# Patient Record
Sex: Female | Born: 1990 | Race: White | Hispanic: No | Marital: Married | Smoking: Former smoker
Health system: Southern US, Community
[De-identification: ages and names within clinical notes are randomized; demographics above are authoritative.]

## PROBLEM LIST (undated history)

## (undated) DIAGNOSIS — F419 Anxiety disorder, unspecified: Secondary | ICD-10-CM

## (undated) DIAGNOSIS — O24419 Gestational diabetes mellitus in pregnancy, unspecified control: Secondary | ICD-10-CM

## (undated) DIAGNOSIS — N83209 Unspecified ovarian cyst, unspecified side: Secondary | ICD-10-CM

## (undated) DIAGNOSIS — R102 Pelvic and perineal pain: Secondary | ICD-10-CM

## (undated) DIAGNOSIS — Z23 Encounter for immunization: Secondary | ICD-10-CM

## (undated) HISTORY — PX: WISDOM TOOTH EXTRACTION: SHX21

## (undated) HISTORY — PX: TONSILLECTOMY: SUR1361

## (undated) HISTORY — DX: Encounter for immunization: Z23

## (undated) HISTORY — DX: Unspecified ovarian cyst, unspecified side: N83.209

## (undated) HISTORY — DX: Pelvic and perineal pain: R10.2

## (undated) HISTORY — DX: Anxiety disorder, unspecified: F41.9

---
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
## 2014-06-15 ENCOUNTER — Ambulatory Visit: Payer: Self-pay | Admitting: Otolaryngology

## 2014-08-04 ENCOUNTER — Ambulatory Visit: Payer: Self-pay

## 2015-04-04 ENCOUNTER — Other Ambulatory Visit: Payer: Self-pay | Admitting: Physician Assistant

## 2015-04-04 DIAGNOSIS — R519 Headache, unspecified: Secondary | ICD-10-CM

## 2015-04-04 DIAGNOSIS — R51 Headache: Principal | ICD-10-CM

## 2015-04-08 ENCOUNTER — Ambulatory Visit: Payer: Self-pay

## 2015-04-10 ENCOUNTER — Ambulatory Visit
Admission: RE | Admit: 2015-04-10 | Discharge: 2015-04-10 | Disposition: A | Discharge status: Home / Self Care | Payer: Commercial Managed Care - HMO | Source: Ambulatory Visit | Attending: Physician Assistant | Admitting: Physician Assistant

## 2015-04-10 DIAGNOSIS — R51 Headache: Secondary | ICD-10-CM | POA: Insufficient documentation

## 2015-04-10 DIAGNOSIS — R519 Headache, unspecified: Secondary | ICD-10-CM

## 2015-06-15 ENCOUNTER — Encounter: Payer: Self-pay | Admitting: General Surgery

## 2015-06-15 ENCOUNTER — Ambulatory Visit (INDEPENDENT_AMBULATORY_CARE_PROVIDER_SITE_OTHER): Payer: 59 | Admitting: General Surgery

## 2015-06-15 VITALS — BP 118/68 | HR 89 | Resp 14 | Ht 63.0 in | Wt 210.0 lb

## 2015-06-15 DIAGNOSIS — K644 Residual hemorrhoidal skin tags: Secondary | ICD-10-CM

## 2015-06-15 DIAGNOSIS — K648 Other hemorrhoids: Secondary | ICD-10-CM | POA: Diagnosis not present

## 2015-06-15 MED ORDER — HYDROCORTISONE ACE-PRAMOXINE 2.5-1 % RE CREA: 1.0000 "application " | TOPICAL_CREAM | Freq: Two times a day (BID) | RECTAL | Status: DC

## 2015-06-15 NOTE — Patient Instructions (Addendum)
Use cream twice a day. Use Miralax daily to help soften your stools. Return in 2 weeks, call if you need to be seen sooner. You may use warm water soaks.

## 2015-06-15 NOTE — Progress Notes (Signed)
Patient ID: Hannah Stevens, female   DOB: 01-Mar-1991, 24 y.o.   MRN: [id number]  Chief Complaint  Patient presents with  . Rectal Problems    hemorrhoids    HPI Hannah Stevens is a 24 y.o. female here for assessment of painful hemorrhoid. She first noticed this on Sunday as pain and irritation but no bleeding. She started using the Preparation H ointment. She has been using medicated wipes also. The hemorrhoid seemed to increase in size. Today when she used the bathroom she had a large amount of blood in the toilet and has blood when she wipes. She states that she has a bowel movement 2-3 times daily. She states that at times the stools can be hard.    HPI Several yrs ago she reportedly had a small tear in anal area-this healed with some local treatment History reviewed. No pertinent past medical history.  Past Surgical History  Procedure Laterality Date  . Tonsillectomy    . Wisdom tooth extraction      Family History  Problem Relation Age of Onset  . Diabetes Father     Social History Social History  Substance Use Topics  . Smoking status: Former Smoker -- 1 years    Quit date: 10/14/2014  . Smokeless tobacco: Never Used  . Alcohol Use: 0.0 oz/week    0 Standard drinks or equivalent per week    No Known Allergies  Current Outpatient Prescriptions  Medication Sig Dispense Refill  . etonogestrel-ethinyl estradiol (NUVARING) 0.12-0.015 MG/24HR vaginal ring Place 1 each vaginally every 28 (twenty-eight) days. Insert vaginally and leave in place for 3 consecutive weeks, then remove for 1 week.    . hydrocortisone-pramoxine (ANALPRAM-HC) 2.5-1 % rectal cream Place 1 application rectally 2 (two) times daily. 30 g 1   No current facility-administered medications for this visit.    Review of Systems Review of Systems  Constitutional: Negative.   Respiratory: Negative.   Cardiovascular: Negative.   Gastrointestinal: Positive for anal bleeding and rectal pain. Negative for  nausea, vomiting, abdominal pain, diarrhea, constipation, blood in stool and abdominal distention.    Blood pressure 118/68, pulse 89, resp. rate 14, height  (1.6 m), weight 210 lb (95.255 kg), last menstrual period 05/25/2015.  Physical Exam Physical Exam  Constitutional: She is oriented to person, place, and time. She appears well-developed and well-nourished.  Eyes: Conjunctivae are normal. No scleral icterus.  Neck: Neck supple.  Cardiovascular: Normal rate, regular rhythm and normal heart sounds.   Pulmonary/Chest: Effort normal and breath sounds normal.  Abdominal: Soft. Bowel sounds are normal.  Genitourinary: Rectal exam shows external hemorrhoid (1 cm, mildly inflammed on the right side  Very tender. MNo bleeding evident. No visible or palpable clot).  Lymphadenopathy:    She has no cervical adenopathy.  Neurological: She is alert and oriented to person, place, and time.  Skin: Skin is warm and dry.  Psychiatric: She has a normal mood and affect.    Data Reviewed  PCP notes  Assessment    1 cm,  inflammed hemorrhoid on the right side 11 ocl   Discussed fully with pt. Trial of sitz baths, Analpram cream 2.5%. Recheck in 2 weeks.  Plan    As above. Rx sent for Analpram.     PCP: Henriette Combs 06/15/2015, 2:25 PM

## 2015-06-27 ENCOUNTER — Encounter: Payer: Self-pay | Admitting: General Surgery

## 2015-06-27 ENCOUNTER — Ambulatory Visit (INDEPENDENT_AMBULATORY_CARE_PROVIDER_SITE_OTHER): Payer: 59 | Admitting: General Surgery

## 2015-06-27 VITALS — BP 122/72 | HR 68 | Resp 14 | Ht 63.0 in | Wt 212.0 lb

## 2015-06-27 DIAGNOSIS — K644 Residual hemorrhoidal skin tags: Secondary | ICD-10-CM

## 2015-06-27 DIAGNOSIS — K648 Other hemorrhoids: Secondary | ICD-10-CM

## 2015-06-27 NOTE — Patient Instructions (Addendum)
Call to with any questions or concerns.

## 2015-06-27 NOTE — Progress Notes (Signed)
24 year old here today for a 2 week follow up for hemorrhoids. Patient has been using analpram cream twice a day. She does report the swelling has gone down a lot. No bleeding.  1cm inflamed skin tag located on the right side minimally improved With consent anoscopy was performed showing no other anal rectal pathology Explained different options of treatment- observe or have a procedure to get it removed in office, Explained the procedure, patient had multiple questions which were answered, patient will make a decsion  PCP: Esperanza Sheets

## 2015-06-28 ENCOUNTER — Encounter: Payer: Self-pay | Admitting: General Surgery

## 2016-07-29 IMAGING — MR MR HEAD W/O CM
9 of 10 series · 40 of 48 positions shown · non-contrast
Comparison: None.

CLINICAL DATA: Face pain and headaches when looking to the RIGHT.
This occurs only with rightward gaze. No pain when turning neck.
Symptoms for 6 months.

EXAM:
MRI HEAD WITHOUT CONTRAST
TECHNIQUE: Multiplanar, multiecho pulse sequences of the brain and surrounding
structures were obtained without intravenous contrast.

[Series 2: T1 · sagittal · 5.0mm · 0.45mm/px · 5 of 29 slices shown]
[im 1/29]
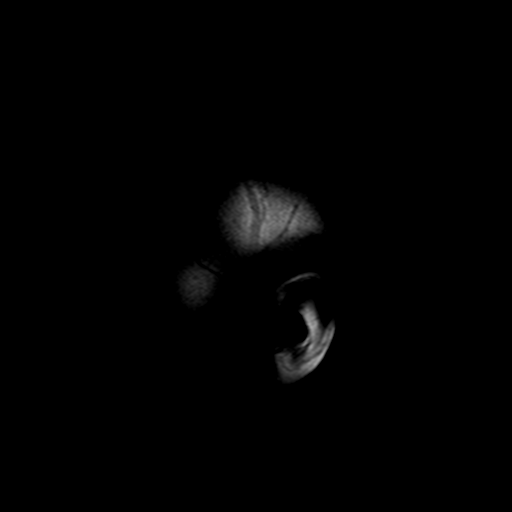
[im 8/29]
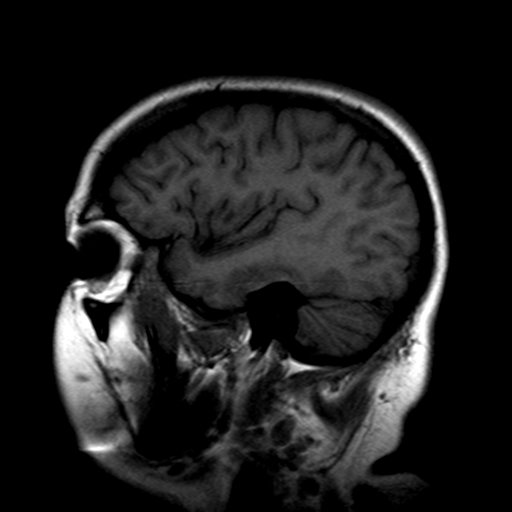
[im 15/29]
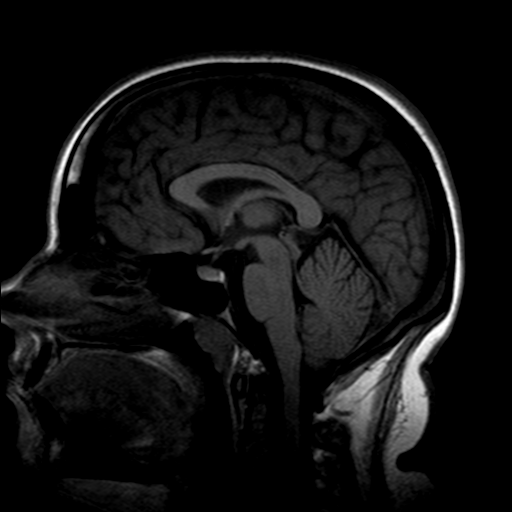
[im 22/29]
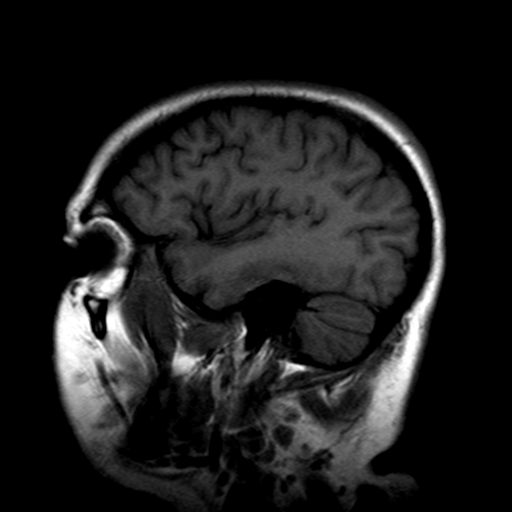
[im 29/29]
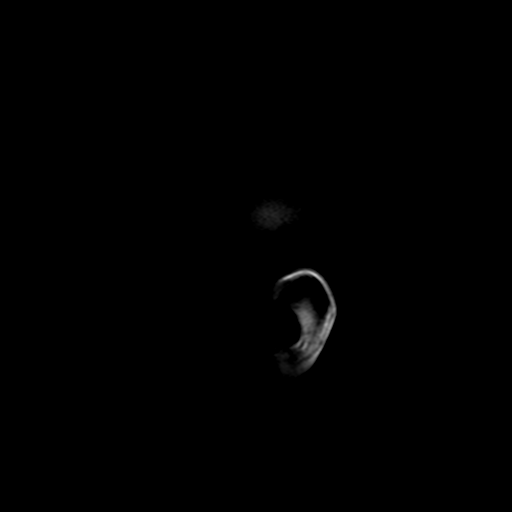

[Series 4: DWI · axial · 4.0mm · 0.94mm/px · z∈[-96,+75]mm · 6 of 44 slices shown (1 of 4)]
[im 1/44]
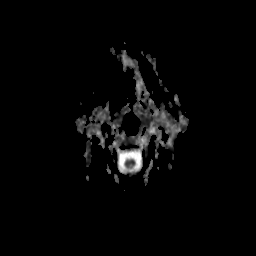
[im 9/44]
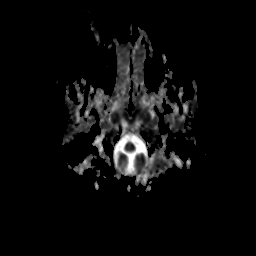
[im 18/44]
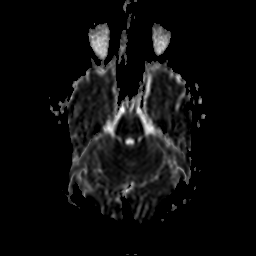
[im 26/44]
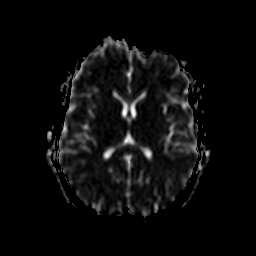
[im 35/44]
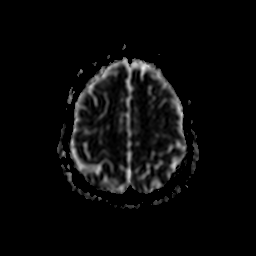
[im 44/44]
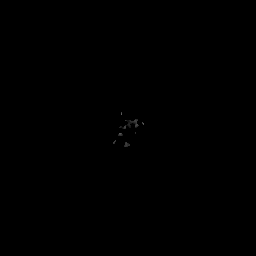

[Series 5: DWI · axial · 4.0mm · 0.94mm/px · z∈[-96,+75]mm · 6 of 44 slices shown (2 of 4)]
[im 1/44]
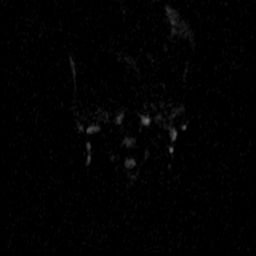
[im 9/44]
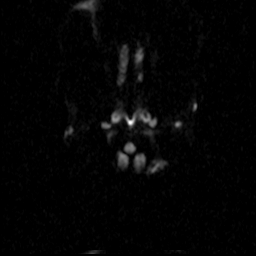
[im 18/44]
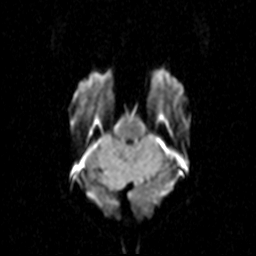
[im 26/44]
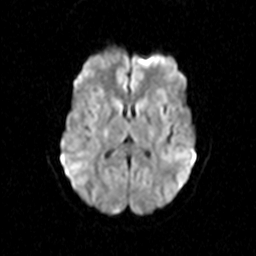
[im 35/44]
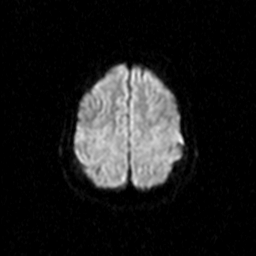
[im 44/44]
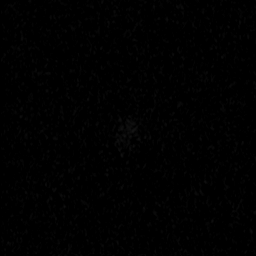

[Series 7: DWI · coronal · 5.0mm · 1.80mm/px · 5 of 39 slices shown (3 of 4)]
[im 1/39]
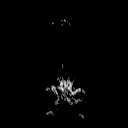
[im 10/39]
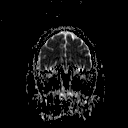
[im 20/39]
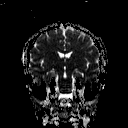
[im 29/39]
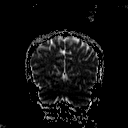
[im 39/39]
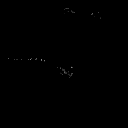

[Series 8: DWI · coronal · 5.0mm · 1.80mm/px · 5 of 39 slices shown (4 of 4)]
[im 1/39]
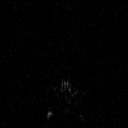
[im 10/39]
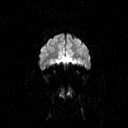
[im 20/39]
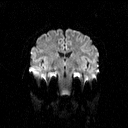
[im 29/39]
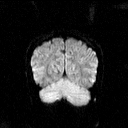
[im 39/39]
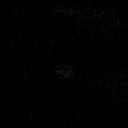

[Series 9: T2 · axial · 5.0mm · 0.45mm/px · z∈[-76,+79]mm · 3 of 25 slices shown (1 of 3)]
[im 1/25]
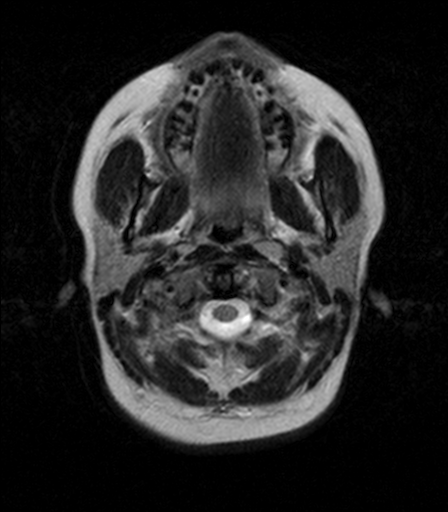
[im 13/25]
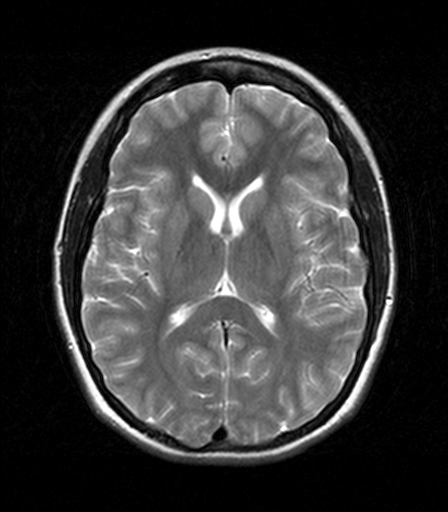
[im 25/25]
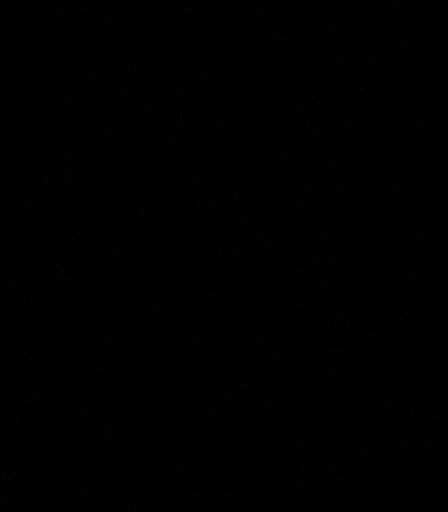

[Series 10: FLAIR · axial · 5.0mm · 0.90mm/px · z∈[-76,+79]mm · 3 of 25 slices shown]
[im 1/25]
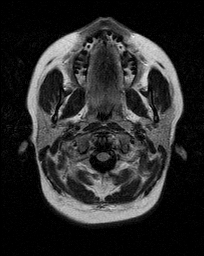
[im 13/25]
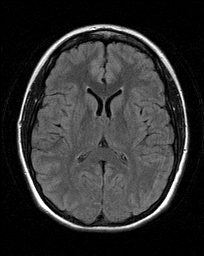
[im 25/25]
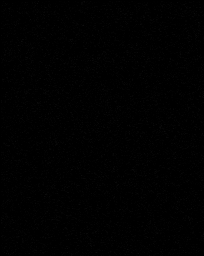

[Series 11: T2 · axial · 5.0mm · 0.45mm/px · z∈[-76,+79]mm · 3 of 25 slices shown (2 of 3)]
[im 1/25]
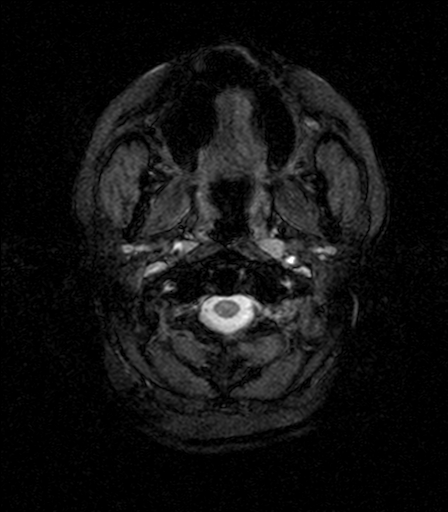
[im 13/25]
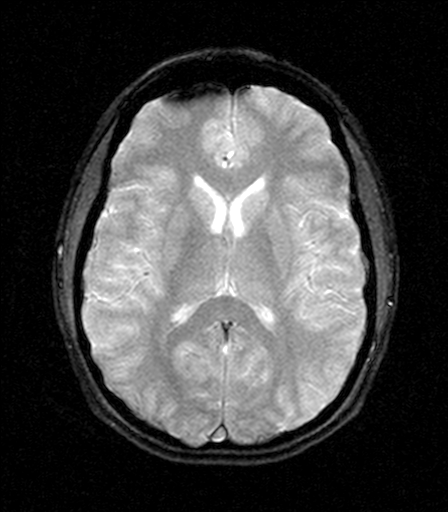
[im 25/25]
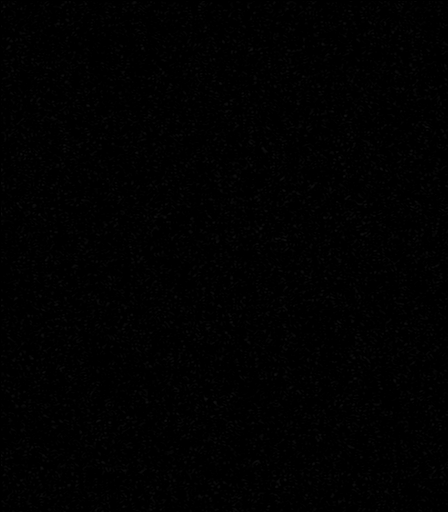

[Series 13: T2 · coronal · 5.0mm · 0.45mm/px · 4 of 30 slices shown (3 of 3)]
[im 1/30]
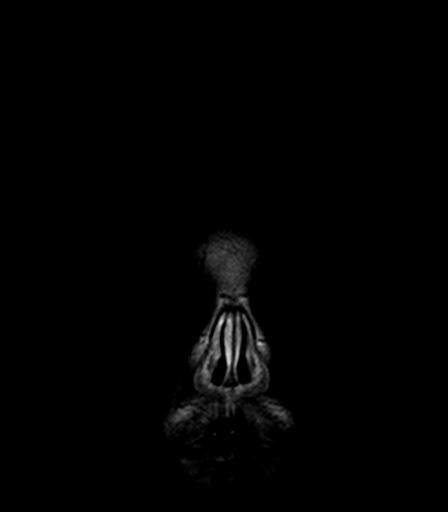
[im 10/30]
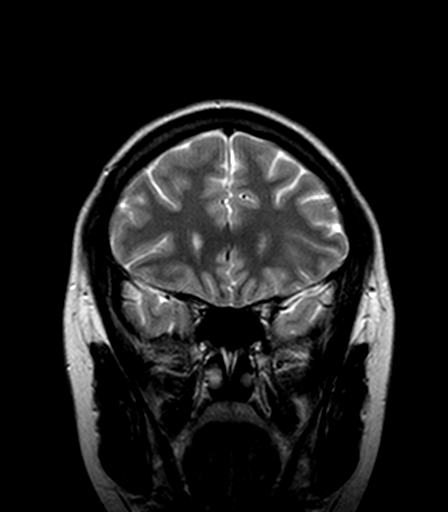
[im 20/30]
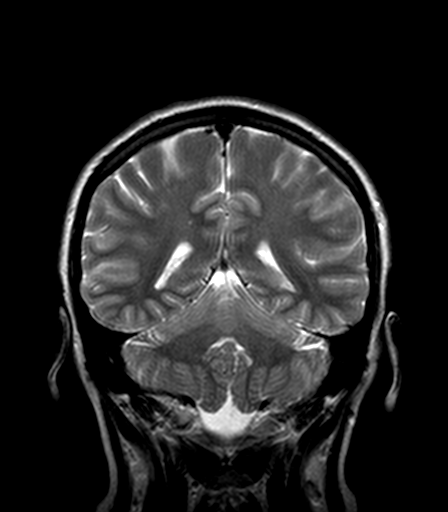
[im 30/30]
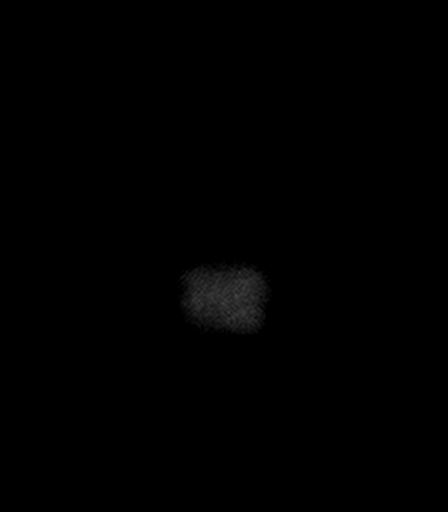

[40 of 48 positions shown; findings below may reference images not displayed]

FINDINGS: No evidence for acute infarction, hemorrhage, mass lesion,
hydrocephalus, or extra-axial fluid. Normal cerebral volume. No
white matter disease. Flow voids are maintained throughout the
carotid, basilar, and vertebral arteries. There are no areas of
chronic hemorrhage.

Pituitary, pineal, and cerebellar tonsils unremarkable. No upper
cervical lesions.

Flow voids are maintained throughout the carotid, basilar, and
vertebral arteries. There are no areas of chronic hemorrhage.

Visualized calvarium, skull base, and upper cervical osseous
structures unremarkable. Scalp and extracranial soft tissues,
orbits, sinuses, and mastoids show no acute process.
IMPRESSION: Negative exam.

## 2017-02-18 ENCOUNTER — Ambulatory Visit (INDEPENDENT_AMBULATORY_CARE_PROVIDER_SITE_OTHER): Payer: PRIVATE HEALTH INSURANCE

## 2017-02-18 ENCOUNTER — Telehealth: Payer: Self-pay | Admitting: Obstetrics and Gynecology

## 2017-02-18 ENCOUNTER — Other Ambulatory Visit: Payer: Self-pay | Admitting: Obstetrics and Gynecology

## 2017-02-18 DIAGNOSIS — R102 Pelvic and perineal pain: Secondary | ICD-10-CM | POA: Diagnosis not present

## 2017-02-18 NOTE — Telephone Encounter (Signed)
Spoke with pt re: normal u/s findings today. Pt complains of LLQ pain that started 4 days ago. Pt is sharp and cramping, worse at night. Only relief is to lie in fetal position. She denies any urin sx, vag sx, GI sx. She was on amox last wk and took diflucan for yeast vag with sx relief. She has been having QOHS cramping pain for the past few wks, but much worse 4 days ago. She is on nuvaring and didn't have these sx last cycle. LMP 01/29/17. She has a hx of an ovar cyst in the past and sx feel the same, although no cysts seen on u/s today. Pt is sex active, no new partner since STD testing 9/17. Pt feels fine now, but could barely walk this morning. Since sx stable, pt ok to wait till tomorrow for scheduled office appt. Pt tried ibup once with sx relief. Can take again prn.

## 2017-02-19 ENCOUNTER — Encounter: Payer: Self-pay | Admitting: Obstetrics and Gynecology

## 2017-02-19 ENCOUNTER — Ambulatory Visit (INDEPENDENT_AMBULATORY_CARE_PROVIDER_SITE_OTHER): Payer: PRIVATE HEALTH INSURANCE | Admitting: Obstetrics and Gynecology

## 2017-02-19 ENCOUNTER — Ambulatory Visit: Payer: 59 | Admitting: Obstetrics and Gynecology

## 2017-02-19 VITALS — BP 118/76 | Ht 63.0 in | Wt 210.0 lb

## 2017-02-19 DIAGNOSIS — M62838 Other muscle spasm: Secondary | ICD-10-CM

## 2017-02-19 DIAGNOSIS — R102 Pelvic and perineal pain: Secondary | ICD-10-CM

## 2017-02-19 DIAGNOSIS — R1032 Left lower quadrant pain: Secondary | ICD-10-CM

## 2017-02-19 DIAGNOSIS — Z113 Encounter for screening for infections with a predominantly sexual mode of transmission: Secondary | ICD-10-CM

## 2017-02-19 LAB — POCT URINALYSIS DIPSTICK
BILIRUBIN UA: NEGATIVE
Glucose, UA: NEGATIVE
Ketones, UA: NEGATIVE
Leukocytes, UA: NEGATIVE
NITRITE UA: NEGATIVE
Protein, UA: NEGATIVE
RBC UA: NEGATIVE
SPEC GRAV UA: 1.01 (ref 1.010–1.025)
UROBILINOGEN UA: 0.2 U/dL
pH, UA: 6 (ref 5.0–8.0)

## 2017-02-19 LAB — POCT URINE PREGNANCY: Preg Test, Ur: NEGATIVE

## 2017-02-19 MED ORDER — CYCLOBENZAPRINE HCL 5 MG PO TABS: 5.0000 mg | ORAL_TABLET | Freq: Three times a day (TID) | ORAL | 0 refills | Status: DC | PRN

## 2017-02-19 NOTE — Progress Notes (Signed)
Chief Complaint  Patient presents with  . Follow-up  . Pelvic Pain    HPI:      [name] is a 26 y.o. G0P0000 who LMP was Patient's last menstrual period was 01/30/2017., presents today for pelvic pain for the past 5 days. Pt had u/s yesterday and f/u appt today. See note below.  02/18/17 TELEPHONE CALL: Spoke with pt re: normal u/s findings today. Pt complains of LLQ pain that started 4 days ago. Pt is sharp and cramping, usually only at night. Only relief is to lie in fetal position. She denies any urin sx, vag sx, GI sx. She was on amox last wk and took diflucan for yeast vag with sx relief. She has been having QOHS cramping pain for the past few wks, but much worse 4 days ago. She is on nuvaring and didn't have these sx last cycle. LMP 01/29/17. She has a hx of an ovar cyst in the past and sx feel the same, although no cysts seen on u/s today. Pt is sex active, no new partner since STD testing 9/17. Pt feels fine now, but could barely walk this morning. Since sx stable, pt ok to wait till tomorrow for scheduled office appt. Pt tried ibup once with sx relief. Can take again prn.   02/19/17  Sx bad again last night, somewhat improved with ibup. Can usually sleep. Sx relieved if in fetal position but occur again once LT leg straightens. Had sx this AM, too, once moving around, but then resolved all day. Pain is sharp and "thumping" at times. Pain not usually present when pt at work and "running around", but sx start after work at home. She has daily BMs, without any change. No pain with sex a few days ago. She denies any abd bulges/masses. No trauma/injury. No hx of endometriosis.   Patient Active Problem List   Diagnosis Date Noted  . Anxiety    Past Surgical History:  Procedure Laterality Date  . TONSILLECTOMY    . WISDOM TOOTH EXTRACTION       Family History  Problem Relation Age of Onset  . Diabetes Father     Social History   Social History  . Marital status: Single      Spouse name: N/A  . Number of children: N/A  . Years of education: N/A   Occupational History  . Not on file.   Social History Main Topics  . Smoking status: Former Smoker    Years: 1.00    Quit date: 10/14/2014  . Smokeless tobacco: Never Used  . Alcohol use 0.0 oz/week  . Drug use: No  . Sexual activity: Yes    Birth control/ protection: Inserts   Other Topics Concern  . Not on file   Social History Narrative  . No narrative on file     Current Outpatient Prescriptions:  .  etonogestrel-ethinyl estradiol (NUVARING) 0.12-0.015 MG/24HR vaginal ring, Place 1 each vaginally every 28 (twenty-eight) days. Insert vaginally and leave in place for 3 consecutive weeks, then remove for 1 week., Disp: , Rfl:  .  cyclobenzaprine (FLEXERIL) 5 MG tablet, Take 1 tablet (5 mg total) by mouth 3 (three) times daily as needed for muscle spasms., Disp: 30 tablet, Rfl: 0 .  hydrocortisone-pramoxine (ANALPRAM-HC) 2.5-1 % rectal cream, Place 1 application rectally 2 (two) times daily. (Patient not taking: Reported on 02/19/2017), Disp: 30 g, Rfl: 1  Review of Systems  Constitutional: Negative for fever.  Gastrointestinal: Negative for blood  in stool, constipation, diarrhea, nausea and vomiting.  Genitourinary: Positive for pelvic pain. Negative for dyspareunia, dysuria, flank pain, frequency, hematuria, urgency, vaginal bleeding, vaginal discharge and vaginal pain.  Musculoskeletal: Negative for back pain.  Skin: Negative for rash.     OBJECTIVE:   Vitals:  BP 118/76   Ht 5\' 3"  (1.6 m)   Wt 210 lb (95.3 kg)   LMP 01/30/2017   BMI 37.20 kg/m   Physical Exam  Constitutional: She is oriented to person, place, and time and well-developed, well-nourished, and in no distress. Vital signs are normal.  Abdominal: Soft. Normal appearance. She exhibits no distension and no mass. There is tenderness in the suprapubic area and left lower quadrant. There is no rigidity, no rebound and no guarding.     TENDER TO PALPATE SPECIFIC AREA LLQ/ING AREA; NO HERNIAS/BULGES  Genitourinary: Vagina normal, uterus normal, cervix normal, right adnexa normal, left adnexa normal and vulva normal. Uterus is not enlarged and not tender. Cervix exhibits no motion tenderness and no tenderness. Right adnexum displays no mass and no tenderness. Left adnexum displays no mass and no tenderness. Vulva exhibits no erythema, no exudate, no lesion, no rash and no tenderness. Vagina exhibits no lesion.  Neurological: She is oriented to person, place, and time.  Vitals reviewed.   Results for orders placed or performed in visit on 02/19/17 (from the past 24 hour(s))  POCT urine pregnancy     Status: Normal   Collection Time: 02/19/17  5:01 PM  Result Value Ref Range   Preg Test, Ur Negative Negative  POCT Urinalysis Dipstick     Status: Normal   Collection Time: 02/19/17  5:02 PM  Result Value Ref Range   Color, UA yellow    Clarity, UA clear    Glucose, UA neg    Bilirubin, UA neg    Ketones, UA neg    Spec Grav, UA 1.010 1.010 - 1.025   Blood, UA neg    pH, UA 6.0 5.0 - 8.0   Protein, UA neg    Urobilinogen, UA 0.2 0.2 or 1.0 E.U./dL   Nitrite, UA neg    Leukocytes, UA Negative Negative   GYN U/S (02/18/17)-->EM=7.34 MM; NO FF IN CDS; BILAT OVAR WITH FOLLICLES  Assessment/Plan: Pelvic pain - Question etiology? Neg urin, neg GYN exam and u/s, no GI sx. Question muscle spasm given sx at end of day, improved with fetal position, exam. Rx flexeril.  - Plan: POCT Urinalysis Dipstick, POCT urine pregnancy, Chlamydia/Gonococcus/Trichomonas, NAA, CT ABDOMEN PELVIS W CONTRAST, CANCELED: CT PELVIS W WO CONTRAST  LLQ pain - Given sx severity and neg exam, check CT scan next wk to give flexeril chance to work/since pt so miserable with sx. Will f/u wiht results. - Plan: CT ABDOMEN PELVIS W CONTRAST, CANCELED: CT PELVIS W WO CONTRAST  Screening for STD (sexually transmitted disease) - Plan:  Chlamydia/Gonococcus/Trichomonas, NAA  Muscle spasm - If neg CT scan and sx persist, can refer to pelvic PT. - Plan: cyclobenzaprine (FLEXERIL) 5 MG tablet, DISCONTINUED: cyclobenzaprine (FLEXERIL) 5 MG tablet     Return if symptoms worsen or fail to improve.  Alicia B. Copland, PA-C 02/20/2017 10:18 AM

## 2017-02-20 ENCOUNTER — Other Ambulatory Visit: Payer: 59

## 2017-02-20 ENCOUNTER — Encounter: Payer: Self-pay | Admitting: Obstetrics and Gynecology

## 2017-02-20 DIAGNOSIS — F419 Anxiety disorder, unspecified: Secondary | ICD-10-CM | POA: Insufficient documentation

## 2017-02-22 LAB — CHLAMYDIA/GONOCOCCUS/TRICHOMONAS, NAA
CHLAMYDIA BY NAA: NEGATIVE
GONOCOCCUS BY NAA: NEGATIVE
Trich vag by NAA: NEGATIVE

## 2017-02-26 ENCOUNTER — Telehealth: Payer: Self-pay

## 2017-02-26 ENCOUNTER — Other Ambulatory Visit: Payer: 59

## 2017-02-26 ENCOUNTER — Ambulatory Visit: Admission: RE | Admit: 2017-02-26 | Payer: 59 | Source: Ambulatory Visit

## 2017-02-26 NOTE — Telephone Encounter (Signed)
Spoke with pt. They were supposed to be expedited to [facility] from [facility], but I don't have them. Will check with medical records.

## 2017-02-26 NOTE — Telephone Encounter (Signed)
Pt calling for CT scan results.  [phone].

## 2017-02-27 NOTE — Telephone Encounter (Signed)
Called [facility] they will fax results now.

## 2017-02-27 NOTE — Telephone Encounter (Signed)
Can you pls call [facility] and ask about CT results done Tues. Never got them. Did they fax them Tues? Can they resend?

## 2017-06-23 ENCOUNTER — Other Ambulatory Visit: Payer: Self-pay | Admitting: Obstetrics and Gynecology

## 2017-06-23 MED ORDER — ETONOGESTREL-ETHINYL ESTRADIOL 0.12-0.015 MG/24HR VA RING: 1.0000 | VAGINAL_RING | VAGINAL | 0 refills | Status: DC

## 2017-06-23 NOTE — Telephone Encounter (Signed)
Pt aware refill eRx'd. 

## 2017-06-23 NOTE — Telephone Encounter (Signed)
Pt is calling back to confirm that she needs her prescription refilled before [facility]

## 2017-06-24 ENCOUNTER — Telehealth: Payer: Self-pay | Admitting: Obstetrics and Gynecology

## 2017-06-24 ENCOUNTER — Other Ambulatory Visit: Payer: Self-pay | Admitting: Obstetrics and Gynecology

## 2017-06-24 MED ORDER — ETONOGESTREL-ETHINYL ESTRADIOL 0.12-0.015 MG/24HR VA RING: 1.0000 | VAGINAL_RING | VAGINAL | 0 refills | Status: DC

## 2017-06-24 NOTE — Telephone Encounter (Signed)
Which Rx? Does she need OCP RF till her annual?

## 2017-06-24 NOTE — Telephone Encounter (Signed)
[facility] And not [facility]. Please advise

## 2017-06-24 NOTE — Telephone Encounter (Signed)
Done

## 2017-06-24 NOTE — Telephone Encounter (Signed)
Just her Birthcontrol refilled

## 2017-06-26 ENCOUNTER — Ambulatory Visit (INDEPENDENT_AMBULATORY_CARE_PROVIDER_SITE_OTHER): Payer: PRIVATE HEALTH INSURANCE | Admitting: Obstetrics and Gynecology

## 2017-06-26 ENCOUNTER — Encounter: Payer: Self-pay | Admitting: Obstetrics and Gynecology

## 2017-06-26 VITALS — BP 116/64 | HR 94 | Ht 63.0 in | Wt 221.0 lb

## 2017-06-26 DIAGNOSIS — Z01419 Encounter for gynecological examination (general) (routine) without abnormal findings: Secondary | ICD-10-CM

## 2017-06-26 DIAGNOSIS — R1032 Left lower quadrant pain: Secondary | ICD-10-CM

## 2017-06-26 DIAGNOSIS — Z3044 Encounter for surveillance of vaginal ring hormonal contraceptive device: Secondary | ICD-10-CM

## 2017-06-26 DIAGNOSIS — Z124 Encounter for screening for malignant neoplasm of cervix: Secondary | ICD-10-CM | POA: Diagnosis not present

## 2017-06-26 MED ORDER — ETONOGESTREL-ETHINYL ESTRADIOL 0.12-0.015 MG/24HR VA RING: VAGINAL_RING | VAGINAL | 12 refills | Status: DC

## 2017-06-26 NOTE — Progress Notes (Signed)
PCP:  Geoffery Lyons, PA   Chief Complaint  Patient presents with  . Gynecologic Exam    pain under right arm x few weeks     HPI:      [name] is a 26 y.o. G0P0000 who LMP was Patient's last menstrual period was 06/21/2017., presents today for her annual examination.  Her menses are regular every 28-30 days, lasting 5 days.  Dysmenorrhea mild, occurring premenstrually. She does not have intermenstrual bleeding.  Sex activity: single partner, contraception - condoms most of the time and NuvaRing vaginal inserts.  Last Pap: June 20, 2016  Results were: no abnormalities  Hx of STDs: none  There is no FH of breast cancer. There is no FH of ovarian cancer. The patient does not do self-breast exams.  Tobacco use: The patient denies current or previous tobacco use. Alcohol use: social drinker No drug use.  Exercise: somewhat active  She does get adequate calcium and Vitamin D in her diet.  LLQ pain from 02/19/17 has improved but she still has occas sx of sharp pain, lasting about 30 min. Pt had neg GYN u/s and neg CT scan. I still think it's musculoskeletal due to sx occurring when pt is active and not at rest. May benefit from pelvic PT ref if sx persist.  Past Medical History:  Diagnosis Date  . Anxiety   . Ovarian cyst   . Pelvic pain   . Vaccine for human papilloma virus (HPV) types 6, 11, 16, and 18 administered     Past Surgical History:  Procedure Laterality Date  . TONSILLECTOMY    . WISDOM TOOTH EXTRACTION      Family History  Problem Relation Age of Onset  . Diabetes Father     Social History   Social History  . Marital status: Single    Spouse name: N/A  . Number of children: N/A  . Years of education: N/A   Occupational History  . Not on file.   Social History Main Topics  . Smoking status: Former Smoker    Years: 1.00    Quit date: 10/14/2014  . Smokeless tobacco: Never Used  . Alcohol use 0.0 oz/week  . Drug use: No  . Sexual  activity: Yes    Birth control/ protection: Inserts   Other Topics Concern  . Not on file   Social History Narrative  . No narrative on file    Current Meds  Medication Sig  . etonogestrel-ethinyl estradiol (NUVARING) 0.12-0.015 MG/24HR vaginal ring Insert vaginally and leave in place for 3 consecutive weeks, then remove for 1 week.  . [DISCONTINUED] etonogestrel-ethinyl estradiol (NUVARING) 0.12-0.015 MG/24HR vaginal ring Place 1 each vaginally every 28 (twenty-eight) days. Insert vaginally and leave in place for 3 consecutive weeks, then remove for 1 week.     ROS:  Review of Systems  Constitutional: Negative for fatigue, fever and unexpected weight change.  Respiratory: Negative for cough, shortness of breath and wheezing.   Cardiovascular: Negative for chest pain, palpitations and leg swelling.  Gastrointestinal: Negative for blood in stool, constipation, diarrhea, nausea and vomiting.  Endocrine: Negative for cold intolerance, heat intolerance and polyuria.  Genitourinary: Negative for dyspareunia, dysuria, flank pain, frequency, genital sores, hematuria, menstrual problem, pelvic pain, urgency, vaginal bleeding, vaginal discharge and vaginal pain.  Musculoskeletal: Negative for back pain, joint swelling and myalgias.  Skin: Negative for rash.  Neurological: Negative for dizziness, syncope, light-headedness, numbness and headaches.  Hematological: Negative for adenopathy.  Psychiatric/Behavioral: Negative for agitation, confusion, sleep disturbance and suicidal ideas. The patient is not nervous/anxious.      Objective: BP 116/64 (BP Location: Left Arm, Patient Position: Sitting, Cuff Size: Normal)   Pulse 94   Ht 5\' 3"  (1.6 m)   Wt 221 lb (100.2 kg)   LMP 06/21/2017   BMI 39.15 kg/m    Physical Exam  Constitutional: She is oriented to person, place, and time. She appears well-developed and well-nourished.  Genitourinary: Vagina normal and uterus normal. There is no  rash or tenderness on the right labia. There is no rash or tenderness on the left labia. No erythema or tenderness in the vagina. No vaginal discharge found. Right adnexum does not display mass and does not display tenderness. Left adnexum does not display mass and does not display tenderness. Cervix does not exhibit motion tenderness or polyp. Uterus is not enlarged or tender.  Neck: Normal range of motion. No thyromegaly present.  Cardiovascular: Normal rate, regular rhythm and normal heart sounds.   No murmur heard. Pulmonary/Chest: Effort normal and breath sounds normal. Right breast exhibits no mass, no nipple discharge, no skin change and no tenderness. Left breast exhibits no mass, no nipple discharge, no skin change and no tenderness.  Abdominal: Soft. There is no tenderness. There is no guarding.  Musculoskeletal: Normal range of motion.  Neurological: She is alert and oriented to person, place, and time. No cranial nerve deficit.  Psychiatric: She has a normal mood and affect. Her behavior is normal.  Vitals reviewed.   Assessment/Plan: Encounter for annual routine gynecological examination  Cervical cancer screening - Plan: IGP, rfx Aptima HPV ASCU  Encounter for surveillance of vaginal ring hormonal contraceptive device - Nuvaring RF.  - Plan: etonogestrel-ethinyl estradiol (NUVARING) 0.12-0.015 MG/24HR vaginal ring  LLQ pain - Will refer to pelvic PT if sx persist.   Meds ordered this encounter  Medications  . etonogestrel-ethinyl estradiol (NUVARING) 0.12-0.015 MG/24HR vaginal ring    Sig: Insert vaginally and leave in place for 3 consecutive weeks, then remove for 1 week.    Dispense:  1 each    Refill:  12             GYN counsel adequate intake of calcium and vitamin D, diet and exercise     F/U  Return in about 1 year (around 06/26/2018).  Alicia B. Copland, PA-C 06/26/2017 3:34 PM

## 2017-07-01 LAB — IGP, RFX APTIMA HPV ASCU: PAP Smear Comment: 0

## 2017-10-31 ENCOUNTER — Ambulatory Visit: Payer: PRIVATE HEALTH INSURANCE | Admitting: Podiatry

## 2017-10-31 ENCOUNTER — Other Ambulatory Visit: Payer: Self-pay | Admitting: Podiatry

## 2017-10-31 ENCOUNTER — Encounter: Payer: Self-pay | Admitting: Podiatry

## 2017-10-31 ENCOUNTER — Ambulatory Visit (INDEPENDENT_AMBULATORY_CARE_PROVIDER_SITE_OTHER): Payer: PRIVATE HEALTH INSURANCE

## 2017-10-31 DIAGNOSIS — M84374A Stress fracture, right foot, initial encounter for fracture: Secondary | ICD-10-CM

## 2017-10-31 DIAGNOSIS — R52 Pain, unspecified: Secondary | ICD-10-CM

## 2017-10-31 MED ORDER — MELOXICAM 15 MG PO TABS: 15.0000 mg | ORAL_TABLET | Freq: Every day | ORAL | 1 refills | Status: DC

## 2017-10-31 NOTE — Progress Notes (Signed)
   Subjective:    Patient ID: Hannah DalesLeah M Stallings, female    DOB: 02-09-1991, 27 y.o.   MRN: [id number]  HPI    Review of Systems  Musculoskeletal: Positive for gait problem.  All other systems reviewed and are negative.      Objective:   Physical Exam        Assessment & Plan:
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
## 2017-11-13 ENCOUNTER — Other Ambulatory Visit: Payer: Self-pay

## 2017-11-13 ENCOUNTER — Encounter: Payer: Self-pay | Admitting: Nurse Practitioner

## 2017-11-13 ENCOUNTER — Ambulatory Visit: Payer: PRIVATE HEALTH INSURANCE | Admitting: Nurse Practitioner

## 2017-11-13 VITALS — BP 122/80 | HR 84 | Resp 16 | Ht 63.0 in | Wt 222.0 lb

## 2017-11-13 DIAGNOSIS — N390 Urinary tract infection, site not specified: Secondary | ICD-10-CM | POA: Diagnosis not present

## 2017-11-13 DIAGNOSIS — R10819 Abdominal tenderness, unspecified site: Secondary | ICD-10-CM | POA: Diagnosis not present

## 2017-11-13 DIAGNOSIS — R300 Dysuria: Secondary | ICD-10-CM

## 2017-11-13 LAB — POCT URINALYSIS DIPSTICK
Bilirubin, UA: NEGATIVE
Blood, UA: NEGATIVE
Glucose, UA: NEGATIVE
KETONES UA: NEGATIVE
NITRITE UA: NEGATIVE
Protein, UA: NEGATIVE
SPEC GRAV UA: 1.01 (ref 1.010–1.025)
Urobilinogen, UA: 0.2 E.U./dL
pH, UA: 6 (ref 5.0–8.0)

## 2017-11-13 MED ORDER — CIPROFLOXACIN HCL 500 MG PO TABS
500.0000 mg | ORAL_TABLET | Freq: Two times a day (BID) | ORAL | 0 refills | Status: DC
Start: 2017-11-13 — End: 2017-11-13

## 2017-11-13 MED ORDER — CIPROFLOXACIN HCL 500 MG PO TABS: 500.0000 mg | ORAL_TABLET | Freq: Two times a day (BID) | ORAL | 0 refills | Status: DC

## 2017-11-13 NOTE — Progress Notes (Addendum)
[facility] [address] [place], [place] [address]  Internal MEDICINE  Office Visit Note  Patient Name: Hannah Stevens  [id number]  [id number]  Date of Service: 11/14/2017  Chief Complaint  Patient presents with  . Abdominal Pain    left abdominal/flank area pain    The patient is here for acute exam. She bega having left flank pain several months ago after starting Keto diet. Pain is sharp and stabbing, radiates into the left groin. As she finished the term of keo diet, she states the pain resolved. Again, she went on keto diet in October, 20188. After a week, she started having this pain again, radiating into the left groin. She did see her GYN provider, as she has had a large ovarian cyst in the past. Her ultrasound was negative, and pain subsided as she stopped keto diet. Pain started again about 3 weeks ago. She did start keto diet again, but stopped right away when pain started. Initially, this left flank pain subsided, but Monday, the pain woke her out of sound sleep. Her GYN provider did CT abdomen and pelvis with contrast. The patient received a phone call saying the CT looked fine. The patient does not believe blod work or urine was taken .   Abdominal Pain  This is a recurrent problem. The current episode started more than 1 month ago. The onset quality is sudden. The problem occurs every several days. The problem has been gradually worsening. The pain is located in the LUQ and left flank. The quality of the pain is sharp, burning and cramping. The abdominal pain radiates to the suprapubic region and pelvis. Associated symptoms include anorexia, frequency and nausea. Pertinent negatives include no constipation, diarrhea, dysuria, fever, headaches or vomiting. The pain is relieved by nothing. She has tried acetaminophen for the symptoms. The treatment provided mild relief. Prior diagnostic workup includes CT scan and ultrasound.    Pt is here for routine follow up.     Current Medication: Outpatient Encounter Medications as of 11/13/2017  Medication Sig  . ciprofloxacin (CIPRO) 500 MG tablet Take 1 tablet (500 mg total) by mouth 2 (two) times daily. For 10 days  . etonogestrel-ethinyl estradiol (NUVARING) 0.12-0.015 MG/24HR vaginal ring Insert vaginally and leave in place for 3 consecutive weeks, then remove for 1 week.  . meloxicam (MOBIC) 15 MG tablet Take 1 tablet (15 mg total) by mouth daily.  . [DISCONTINUED] ciprofloxacin (CIPRO) 500 MG tablet Take 1 tablet (500 mg total) by mouth 2 (two) times daily. For 10 days  . [DISCONTINUED] ciprofloxacin (CIPRO) 500 MG tablet Take 1 tablet (500 mg total) by mouth 2 (two) times daily.   No facility-administered encounter medications on file as of 11/13/2017.     Surgical History: Past Surgical History:  Procedure Laterality Date  . TONSILLECTOMY    . WISDOM TOOTH EXTRACTION      Medical History: Past Medical History:  Diagnosis Date  . Anxiety   . Ovarian cyst   . Pelvic pain   . Vaccine for human papilloma virus (HPV) types 6, 11, 16, and 18 administered     Family History: Family History  Problem Relation Age of Onset  . Diabetes Father     Social History   Socioeconomic History  . Marital status: Single    Spouse name: Not on file  . Number of children: Not on file  . Years of education: Not on file  . Highest education level: Not on file  Social  Needs  . Financial resource strain: Not on file  . Food insecurity - worry: Not on file  . Food insecurity - inability: Not on file  . Transportation needs - medical: Not on file  . Transportation needs - non-medical: Not on file  Occupational History  . Not on file  Tobacco Use  . Smoking status: Former Smoker    Years: 1.00    Last attempt to quit: 10/14/2014    Years since quitting: 3.0  . Smokeless tobacco: Never Used  Substance and Sexual Activity  . Alcohol use: Yes    Alcohol/week: 0.0 oz  . Drug use: No  . Sexual  activity: Yes    Birth control/protection: Inserts  Other Topics Concern  . Not on file  Social History Narrative  . Not on file      Review of Systems  Constitutional: Positive for activity change, appetite change and fatigue. Negative for fever.  HENT: Negative for congestion, postnasal drip, rhinorrhea, sinus pain and sore throat.   Respiratory: Negative for cough, chest tightness and wheezing.   Cardiovascular: Negative for chest pain and palpitations.  Gastrointestinal: Positive for abdominal pain, anorexia and nausea. Negative for constipation, diarrhea, rectal pain and vomiting.  Endocrine: Negative for cold intolerance, heat intolerance, polydipsia, polyphagia and polyuria.  Genitourinary: Positive for flank pain and frequency. Negative for dysuria, vaginal discharge and vaginal pain.  Musculoskeletal: Positive for back pain.  Skin: Negative.   Allergic/Immunologic: Negative for environmental allergies, food allergies and immunocompromised state.  Neurological: Negative for weakness and headaches.  Hematological: Negative.   Psychiatric/Behavioral: Negative for agitation and behavioral problems. The patient is not nervous/anxious.     Today's Vitals   11/13/17 1149  BP: 122/80  Pulse: 84  Resp: 16  SpO2: 98%  Weight: 222 lb (100.7 kg)  Height: 5\' 3"  (1.6 m)    Physical Exam  Constitutional: She is oriented to person, place, and time. She appears well-developed and well-nourished. No distress.  HENT:  Head: Normocephalic and atraumatic.  Mouth/Throat: Oropharynx is clear and moist. No oropharyngeal exudate.  Eyes: EOM are normal. Pupils are equal, round, and reactive to light.  Neck: Normal range of motion. Neck supple. No JVD present. No tracheal deviation present. No thyromegaly present.  Cardiovascular: Normal rate, regular rhythm and normal heart sounds. Exam reveals no gallop and no friction rub.  No murmur heard. Pulmonary/Chest: Effort normal and breath  sounds normal. No respiratory distress. She has no wheezes. She has no rales. She exhibits no tenderness.  Abdominal: Soft. Bowel sounds are normal. There is tenderness.    Genitourinary:  Genitourinary Comments: Urine sample positive for small WBC  Musculoskeletal: Normal range of motion.  Lymphadenopathy:    She has no cervical adenopathy.  Neurological: She is alert and oriented to person, place, and time. No cranial nerve deficit.  Skin: Skin is warm and dry. She is not diaphoretic.  Psychiatric: She has a normal mood and affect. Her behavior is normal. Judgment and thought content normal.  Nursing note and vitals reviewed.   Assessment/Plan:  1. Abdominal tenderness in left flank - Comprehensive Metabolic Panel (CMET) - CBC w/Diff/Platelet -will get rectn CT abd/pelvis results from [facility] for review.   2. Urinary tract infection without hematuria, site unspecified Start cipro 500mg  bid for 10 days. Sent urine for culture and sensitivity and adjust abx as indicated.  - CULTURE, URINE COMPREHENSIVE - Comprehensive Metabolic Panel (CMET) - CBC w/Diff/Platelet  3. Dysuria - POCT Urinalysis Dipstick  General  Counseling: Aliyah verbalizes understanding of the findings of todays visit and agrees with plan of treatment. I have discussed any further diagnostic evaluation that may be needed or ordered today. We also reviewed her medications today. she has been encouraged to call the office with any questions or concerns that should arise related to todays visit.   This patient was seen by Vincent GrosHeather Calvary Difranco, FNP- C in Collaboration with Dr Lyndon CodeFozia M Khan as a part of collaborative care agreement    Orders Placed This Encounter  Procedures  . CULTURE, URINE COMPREHENSIVE  . Comprehensive Metabolic Panel (CMET)  . CBC w/Diff/Platelet  . POCT Urinalysis Dipstick    Meds ordered this encounter  Medications  . DISCONTD: ciprofloxacin (CIPRO) 500 MG tablet    Sig: Take 1 tablet  (500 mg total) by mouth 2 (two) times daily.    Dispense:  6 tablet    Refill:  0    Order Specific Question:   Supervising Provider    Answer:   Lyndon CodeKHAN, FOZIA M [[id number]]  . ciprofloxacin (CIPRO) 500 MG tablet    Sig: Take 1 tablet (500 mg total) by mouth 2 (two) times daily. For 10 days    Dispense:  20 tablet    Refill:  0    Order Specific Question:   Supervising Provider    Answer:   Lyndon CodeKHAN, FOZIA M [[id number]]    Time spent: 7715 Minutes     Dr Lyndon CodeFozia M Khan Internal medicine

## 2017-11-14 MED ORDER — CIPROFLOXACIN HCL 500 MG PO TABS: 500.0000 mg | ORAL_TABLET | Freq: Two times a day (BID) | ORAL | 0 refills | Status: DC

## 2017-11-17 LAB — CULTURE, URINE COMPREHENSIVE

## 2017-11-28 ENCOUNTER — Ambulatory Visit (INDEPENDENT_AMBULATORY_CARE_PROVIDER_SITE_OTHER): Payer: PRIVATE HEALTH INSURANCE | Admitting: Podiatry

## 2017-11-28 ENCOUNTER — Ambulatory Visit (INDEPENDENT_AMBULATORY_CARE_PROVIDER_SITE_OTHER): Payer: PRIVATE HEALTH INSURANCE

## 2017-11-28 ENCOUNTER — Encounter: Payer: Self-pay | Admitting: Podiatry

## 2017-11-28 DIAGNOSIS — M84374D Stress fracture, right foot, subsequent encounter for fracture with routine healing: Secondary | ICD-10-CM

## 2017-11-28 MED ORDER — MELOXICAM 15 MG PO TABS: 15.0000 mg | ORAL_TABLET | Freq: Every day | ORAL | 1 refills | Status: AC

## 2017-11-30 NOTE — Progress Notes (Signed)
   HPI: 27 year old female presenting today for follow up evaluation of a stress fracture of the third metatarsal of the right foot. She reports some intermittent cramping to the dorsum of the foot when not wearing post op shoe. She denies any new complaints at this time. Patient is here for further evaluation and treatment.   Past Medical History:  Diagnosis Date  . Anxiety   . Ovarian cyst   . Pelvic pain   . Vaccine for human papilloma virus (HPV) types 6, 11, 16, and 18 administered      Physical Exam: General: The patient is alert and oriented x3 in no acute distress.  Dermatology: Skin is warm, dry and supple bilateral lower extremities. Negative for open lesions or macerations.  Vascular: Palpable pedal pulses bilaterally. No edema or erythema noted. Capillary refill within normal limits.  Neurological: Epicritic and protective threshold grossly intact bilaterally.   Musculoskeletal Exam: Range of motion within normal limits to all pedal and ankle joints bilateral. Muscle strength 5/5 in all groups bilateral.   Radiographic Exam:  Normal osseous mineralization. Joint spaces preserved. No fracture/dislocation/boney destruction.    Assessment: - stress fracture third metatarsal right foot   Plan of Care:  - Patient evaluated. X-Rays reviewed.  - May return to full activity with no restrictions.  - Transition out of post op shoe.  - Refill prescription for Meloxicam provided to patient.  - Return to clinic as needed.    Felecia ShellingBrent M. Zahava Quant, DPM [facility]  Dr. Felecia ShellingBrent M. Rommel Hogston, DPM    [address]                                        [place], [place] [address]                Office [phone]  Fax [phone]

## 2018-02-03 ENCOUNTER — Other Ambulatory Visit: Payer: Self-pay

## 2018-02-03 MED ORDER — LIRAGLUTIDE -WEIGHT MANAGEMENT 18 MG/3ML ~~LOC~~ SOPN: 0.6000 mg | PEN_INJECTOR | Freq: Every day | SUBCUTANEOUS | 5 refills | Status: DC

## 2018-03-12 ENCOUNTER — Ambulatory Visit: Payer: PRIVATE HEALTH INSURANCE | Admitting: Nurse Practitioner

## 2018-03-12 ENCOUNTER — Encounter: Payer: Self-pay | Admitting: Nurse Practitioner

## 2018-03-12 VITALS — BP 112/72 | HR 71 | Resp 16 | Ht 63.0 in | Wt 229.0 lb

## 2018-03-12 DIAGNOSIS — E668 Other obesity: Secondary | ICD-10-CM | POA: Diagnosis not present

## 2018-03-12 DIAGNOSIS — R635 Abnormal weight gain: Secondary | ICD-10-CM | POA: Diagnosis not present

## 2018-03-12 DIAGNOSIS — R7301 Impaired fasting glucose: Secondary | ICD-10-CM | POA: Diagnosis not present

## 2018-03-12 DIAGNOSIS — E559 Vitamin D deficiency, unspecified: Secondary | ICD-10-CM | POA: Insufficient documentation

## 2018-03-12 DIAGNOSIS — R5383 Other fatigue: Secondary | ICD-10-CM | POA: Diagnosis not present

## 2018-03-12 HISTORY — DX: Impaired fasting glucose: R73.01

## 2018-03-12 HISTORY — DX: Other fatigue: R53.83

## 2018-03-12 HISTORY — DX: Abnormal weight gain: R63.5

## 2018-03-12 HISTORY — DX: Vitamin D deficiency, unspecified: E55.9

## 2018-03-12 MED ORDER — PHENTERMINE HCL 37.5 MG PO TABS: 37.5000 mg | ORAL_TABLET | Freq: Every day | ORAL | 1 refills | Status: DC

## 2018-03-12 NOTE — Progress Notes (Signed)
[facility] [address] [place], [place] [address]  Internal MEDICINE  Office Visit Note  Patient Name: Hannah Stevens  [id number]  [id number]  Date of Service: 03/12/2018  Chief Complaint  Patient presents with  . Weight Gain    5 pound weight gain since last visit.   Marland Kitchen Hypothyroidism    The patient is concerned about recent weight gain. She states that this has been issues since she did keto diet back in the fall. Since then, she feels like her body as been messed up. She has made very positive diet changes. Has joined [employer]. Eliminated high fat meats and concentrated sugars from her diet. She has joined exercise group which meets    Pt is here for routine follow up.    Current Medication: Outpatient Encounter Medications as of 03/12/2018  Medication Sig  . etonogestrel-ethinyl estradiol (NUVARING) 0.12-0.015 MG/24HR vaginal ring Insert vaginally and leave in place for 3 consecutive weeks, then remove for 1 week.  . Liraglutide -Weight Management (SAXENDA) 18 MG/3ML SOPN Inject 0.6 mg into the skin daily. Inject once daily and titrate up 0.6 each week to full dose at 3.0 mg  . phentermine (ADIPEX-P) 37.5 MG tablet Take 1 tablet (37.5 mg total) by mouth daily before breakfast.   No facility-administered encounter medications on file as of 03/12/2018.     Surgical History: Past Surgical History:  Procedure Laterality Date  . TONSILLECTOMY    . WISDOM TOOTH EXTRACTION      Medical History: Past Medical History:  Diagnosis Date  . Anxiety   . Ovarian cyst   . Pelvic pain   . Vaccine for human papilloma virus (HPV) types 6, 11, 16, and 18 administered     Family History: Family History  Problem Relation Age of Onset  . Diabetes Father     Social History   Socioeconomic History  . Marital status: Single    Spouse name: Not on file  . Number of children: Not on file  . Years of education: Not on file  . Highest education level: Not on  file  Occupational History  . Not on file  Social Needs  . Financial resource strain: Not on file  . Food insecurity:    Worry: Not on file    Inability: Not on file  . Transportation needs:    Medical: Not on file    Non-medical: Not on file  Tobacco Use  . Smoking status: Former Smoker    Years: 1.00    Last attempt to quit: 10/14/2014    Years since quitting: 3.4  . Smokeless tobacco: Never Used  Substance and Sexual Activity  . Alcohol use: Yes    Alcohol/week: 0.0 oz  . Drug use: No  . Sexual activity: Yes    Birth control/protection: Inserts  Lifestyle  . Physical activity:    Days per week: Not on file    Minutes per session: Not on file  . Stress: Not on file  Relationships  . Social connections:    Talks on phone: Not on file    Gets together: Not on file    Attends religious service: Not on file    Active member of club or organization: Not on file    Attends meetings of clubs or organizations: Not on file    Relationship status: Not on file  . Intimate partner violence:    Fear of current or ex partner: Not on file    Emotionally abused:  Not on file    Physically abused: Not on file    Forced sexual activity: Not on file  Other Topics Concern  . Not on file  Social History Narrative  . Not on file      Review of Systems  Constitutional: Positive for fatigue and unexpected weight change. Negative for activity change and chills.       Weight gain 5 pounds since her last visit.   HENT: Negative for congestion, postnasal drip, rhinorrhea, sneezing and sore throat.   Eyes: Negative.  Negative for redness.  Respiratory: Negative for cough, chest tightness, shortness of breath and wheezing.   Cardiovascular: Negative for chest pain and palpitations.  Gastrointestinal: Negative for abdominal pain, constipation, diarrhea, nausea and vomiting.  Endocrine: Negative for cold intolerance, heat intolerance, polydipsia, polyphagia and polyuria.  Genitourinary:  Negative.  Negative for dysuria and frequency.  Musculoskeletal: Negative for arthralgias, back pain, joint swelling, myalgias and neck pain.  Skin: Negative for rash.  Allergic/Immunologic: Negative for environmental allergies.  Neurological: Negative for tremors, numbness and headaches.  Hematological: Negative for adenopathy. Does not bruise/bleed easily.  Psychiatric/Behavioral: Negative for behavioral problems (Depression), sleep disturbance and suicidal ideas. The patient is not nervous/anxious.     Today's Vitals   03/12/18 0953  BP: 112/72  Pulse: 71  Resp: 16  SpO2: 98%  Weight: 229 lb (103.9 kg)  Height:  (1.6 m)    Physical Exam  Constitutional: She is oriented to person, place, and time. She appears well-developed and well-nourished. No distress.  HENT:  Head: Normocephalic and atraumatic.  Nose: Nose normal.  Mouth/Throat: Oropharynx is clear and moist. No oropharyngeal exudate.  Eyes: Pupils are equal, round, and reactive to light. EOM are normal.  Neck: Normal range of motion. Neck supple. No JVD present. No tracheal deviation present. No thyromegaly present.  Cardiovascular: Normal rate, regular rhythm and normal heart sounds. Exam reveals no gallop and no friction rub.  No murmur heard. Pulmonary/Chest: Effort normal and breath sounds normal. No respiratory distress. She has no wheezes. She has no rales. She exhibits no tenderness.  Abdominal: Soft. Bowel sounds are normal.  Musculoskeletal: Normal range of motion.  Lymphadenopathy:    She has no cervical adenopathy.  Neurological: She is alert and oriented to person, place, and time. No cranial nerve deficit.  Skin: Skin is warm and dry. She is not diaphoretic.  Psychiatric: She has a normal mood and affect. Her behavior is normal. Judgment and thought content normal.  Nursing note and vitals reviewed.  Assessment/Plan:  1. Abnormal weight gain Check labs with full thyroid panel.  - Comprehensive  metabolic panel - T4, free - TSH - Lipid panel - Thyroglobulin Level - Thyroid peroxidase antibody  2. Moderate obesity Add phentermine 37.5mg  tablets. Continue with weight watchers and regular meetings with exercise group  - phentermine (ADIPEX-P) 37.5 MG tablet; Take 1 tablet (37.5 mg total) by mouth daily before breakfast.  Dispense: 30 tablet; Refill: 1  3. Other fatigue Check labs. - CBC with Differential/Platelet - Comprehensive metabolic panel - T4, free - TSH  4. Impaired fasting glucose - Comprehensive metabolic panel - HgB A1c  5. Vitamin D deficiency - Vitamin D 1,25 dihydroxy  General Counseling: Christmas verbalizes understanding of the findings of todays visit and agrees with plan of treatment. I have discussed any further diagnostic evaluation that may be needed or ordered today. We also reviewed her medications today. she has been encouraged to call the office with any questions  or concerns that should arise related to todays visit.   There is a liability release in patients' chart. There has been a 10 minute discussion about the side effects including but not limited to elevated blood pressure, anxiety, lack of sleep and dry mouth. Pt understands and will like to start/continue on appetite suppressant at this time. There will be one month RX given at the time of visit with proper follow up. Nova diet plan with restricted calories is given to the pt. Pt understands and agrees with  plan of treatment  This patient was seen by Vincent Gros, FNP- C in Collaboration with Dr Lyndon Code as a part of collaborative care agreement  Orders Placed This Encounter  Procedures  . CBC with Differential/Platelet  . Comprehensive metabolic panel  . T4, free  . TSH  . Lipid panel  . Vitamin D 1,25 dihydroxy  . HgB A1c  . Thyroglobulin Level  . Thyroid peroxidase antibody    Meds ordered this encounter  Medications  . phentermine (ADIPEX-P) 37.5 MG tablet    Sig: Take 1  tablet (37.5 mg total) by mouth daily before breakfast.    Dispense:  30 tablet    Refill:  1    Order Specific Question:   Supervising Provider    Answer:   Lyndon Code [[id number]]    Time spent: 34 Minutes     Dr Lyndon Code Internal medicine

## 2018-03-18 ENCOUNTER — Telehealth: Payer: Self-pay | Admitting: Nurse Practitioner

## 2018-03-18 NOTE — Telephone Encounter (Signed)
Pt advised all labs are normal

## 2018-03-18 NOTE — Telephone Encounter (Signed)
-----   Message from [name], NP sent at 03/18/2018 10:04 AM EDT ----- Please let the patient know that all of her labs were normal and look good. Thanks.

## 2018-03-21 LAB — COMPREHENSIVE METABOLIC PANEL
A/G RATIO: 1.7 (ref 1.2–2.2)
ALT: 16 IU/L (ref 0–32)
AST: 17 IU/L (ref 0–40)
Albumin: 4.3 g/dL (ref 3.5–5.5)
Alkaline Phosphatase: 71 IU/L (ref 39–117)
BILIRUBIN TOTAL: 0.4 mg/dL (ref 0.0–1.2)
BUN/Creatinine Ratio: 17 (ref 9–23)
BUN: 14 mg/dL (ref 6–20)
CALCIUM: 9.6 mg/dL (ref 8.7–10.2)
CHLORIDE: 104 mmol/L (ref 96–106)
CO2: 22 mmol/L (ref 20–29)
Creatinine, Ser: 0.82 mg/dL (ref 0.57–1.00)
GFR, EST AFRICAN AMERICAN: 114 mL/min/{1.73_m2} (ref >59)
GFR, EST NON AFRICAN AMERICAN: 99 mL/min/{1.73_m2} (ref >59)
Globulin, Total: 2.5 g/dL (ref 1.5–4.5)
Glucose: 86 mg/dL (ref 65–99)
POTASSIUM: 4.5 mmol/L (ref 3.5–5.2)
SODIUM: 141 mmol/L (ref 134–144)
TOTAL PROTEIN: 6.8 g/dL (ref 6.0–8.5)

## 2018-03-21 LAB — CBC WITH DIFFERENTIAL/PLATELET
BASOS: 0 %
Basophils Absolute: 0 10*3/uL (ref 0.0–0.2)
EOS (ABSOLUTE): 0 10*3/uL (ref 0.0–0.4)
EOS: 1 %
HEMATOCRIT: 38.7 % (ref 34.0–46.6)
Hemoglobin: 13.4 g/dL (ref 11.1–15.9)
IMMATURE GRANS (ABS): 0 10*3/uL (ref 0.0–0.1)
Immature Granulocytes: 0 %
Lymphocytes Absolute: 2.1 10*3/uL (ref 0.7–3.1)
Lymphs: 39 %
MCH: 30.1 pg (ref 26.6–33.0)
MCHC: 34.6 g/dL (ref 31.5–35.7)
MCV: 87 fL (ref 79–97)
MONOS ABS: 0.3 10*3/uL (ref 0.1–0.9)
Monocytes: 5 %
NEUTROS ABS: 3 10*3/uL (ref 1.4–7.0)
Neutrophils: 55 %
PLATELETS: 273 10*3/uL (ref 150–450)
RBC: 4.45 x10E6/uL (ref 3.77–5.28)
RDW: 12.4 % (ref 12.3–15.4)
WBC: 5.5 10*3/uL (ref 3.4–10.8)

## 2018-03-21 LAB — TSH: TSH: 1.41 u[IU]/mL (ref 0.450–4.500)

## 2018-03-21 LAB — LIPID PANEL
CHOLESTEROL TOTAL: 157 mg/dL (ref 100–199)
Chol/HDL Ratio: 2.7 ratio (ref 0.0–4.4)
HDL: 59 mg/dL (ref >39)
LDL Calculated: 75 mg/dL (ref 0–99)
Triglycerides: 115 mg/dL (ref 0–149)
VLDL CHOLESTEROL CAL: 23 mg/dL (ref 5–40)

## 2018-03-21 LAB — VITAMIN D 1,25 DIHYDROXY
Vitamin D 1, 25 (OH)2 Total: 45 pg/mL
Vitamin D2 1, 25 (OH)2: <10 pg/mL
Vitamin D3 1, 25 (OH)2: 45 pg/mL

## 2018-03-21 LAB — THYROGLOBULIN LEVEL: THYROGLOBULIN (TG-RIA): 8.1 ng/mL

## 2018-03-21 LAB — THYROID PEROXIDASE ANTIBODY: THYROID PEROXIDASE ANTIBODY: 11 [IU]/mL (ref 0–34)

## 2018-03-21 LAB — HEMOGLOBIN A1C
ESTIMATED AVERAGE GLUCOSE: 91 mg/dL
Hgb A1c MFr Bld: 4.8 % (ref 4.8–5.6)

## 2018-03-21 LAB — T4, FREE: Free T4: 1.27 ng/dL (ref 0.82–1.77)

## 2018-04-13 ENCOUNTER — Ambulatory Visit: Payer: PRIVATE HEALTH INSURANCE | Admitting: Nurse Practitioner

## 2018-04-13 ENCOUNTER — Encounter: Payer: Self-pay | Admitting: Nurse Practitioner

## 2018-04-13 VITALS — BP 122/80 | HR 80 | Resp 16 | Ht 63.0 in | Wt 219.0 lb

## 2018-04-13 DIAGNOSIS — R5383 Other fatigue: Secondary | ICD-10-CM

## 2018-04-13 DIAGNOSIS — E668 Other obesity: Secondary | ICD-10-CM | POA: Diagnosis not present

## 2018-04-13 MED ORDER — PHENTERMINE HCL 37.5 MG PO TABS: 37.5000 mg | ORAL_TABLET | Freq: Every day | ORAL | 1 refills | Status: DC

## 2018-04-13 NOTE — Progress Notes (Signed)
[facility] [address] [place], [place] [address]  Internal MEDICINE  Office Visit Note  Patient Name: Hannah Stevens  [id number]  [id number]  Date of Service: 04/13/2018   Pt is here for routine follow up.    Chief Complaint  Patient presents with  . weight management    follow up  . Results    review labs    The patient has had a 10 pound weight loss since her last visit. Started her back on phentermine at recent visit. She is now exercising two to three times per week, high intensity for about one hour. She is also going to weight watchers. States that she has lost 16 inches. Feels very good. Does not have negative side effects to report today.       Current Medication: Outpatient Encounter Medications as of 04/13/2018  Medication Sig  . Docusate Sodium (STOOL SOFTENER LAXATIVE PO) Take by mouth.  . etonogestrel-ethinyl estradiol (NUVARING) 0.12-0.015 MG/24HR vaginal ring Insert vaginally and leave in place for 3 consecutive weeks, then remove for 1 week.  . Liraglutide -Weight Management (SAXENDA) 18 MG/3ML SOPN Inject 0.6 mg into the skin daily. Inject once daily and titrate up 0.6 each week to full dose at 3.0 mg  . phentermine (ADIPEX-P) 37.5 MG tablet Take 1 tablet (37.5 mg total) by mouth daily before breakfast.  . [DISCONTINUED] phentermine (ADIPEX-P) 37.5 MG tablet Take 1 tablet (37.5 mg total) by mouth daily before breakfast.   No facility-administered encounter medications on file as of 04/13/2018.     Surgical History: Past Surgical History:  Procedure Laterality Date  . TONSILLECTOMY    . WISDOM TOOTH EXTRACTION      Medical History: Past Medical History:  Diagnosis Date  . Anxiety   . Ovarian cyst   . Pelvic pain   . Vaccine for human papilloma virus (HPV) types 6, 11, 16, and 18 administered     Family History: Family History  Problem Relation Age of Onset  . Diabetes Father     Social History   Socioeconomic History  .  Marital status: Single    Spouse name: Not on file  . Number of children: Not on file  . Years of education: Not on file  . Highest education level: Not on file  Occupational History  . Not on file  Social Needs  . Financial resource strain: Not on file  . Food insecurity:    Worry: Not on file    Inability: Not on file  . Transportation needs:    Medical: Not on file    Non-medical: Not on file  Tobacco Use  . Smoking status: Former Smoker    Years: 1.00    Last attempt to quit: 10/14/2014    Years since quitting: 3.4  . Smokeless tobacco: Never Used  Substance and Sexual Activity  . Alcohol use: Yes    Alcohol/week: 0.0 oz  . Drug use: No  . Sexual activity: Yes    Birth control/protection: Inserts  Lifestyle  . Physical activity:    Days per week: Not on file    Minutes per session: Not on file  . Stress: Not on file  Relationships  . Social connections:    Talks on phone: Not on file    Gets together: Not on file    Attends religious service: Not on file    Active member of club or organization: Not on file    Attends meetings of clubs or organizations:  Not on file    Relationship status: Not on file  . Intimate partner violence:    Fear of current or ex partner: Not on file    Emotionally abused: Not on file    Physically abused: Not on file    Forced sexual activity: Not on file  Other Topics Concern  . Not on file  Social History Narrative  . Not on file      Review of Systems  Constitutional: Positive for fatigue. Negative for activity change, chills and unexpected weight change.       Weight loss of 11 pounds since her last visit .  HENT: Negative for congestion, postnasal drip, rhinorrhea, sneezing and sore throat.   Eyes: Negative.  Negative for redness.  Respiratory: Negative for cough, chest tightness, shortness of breath and wheezing.   Cardiovascular: Negative for chest pain and palpitations.  Gastrointestinal: Negative for abdominal pain,  constipation, diarrhea, nausea and vomiting.  Endocrine: Negative for cold intolerance, heat intolerance, polydipsia, polyphagia and polyuria.  Genitourinary: Negative.  Negative for dysuria and frequency.  Musculoskeletal: Negative for arthralgias, back pain, joint swelling, myalgias and neck pain.  Skin: Negative for rash.  Allergic/Immunologic: Negative for environmental allergies.  Neurological: Negative for tremors, numbness and headaches.  Hematological: Negative for adenopathy. Does not bruise/bleed easily.  Psychiatric/Behavioral: Negative for behavioral problems (Depression), sleep disturbance and suicidal ideas. The patient is not nervous/anxious.     Today's Vitals   04/13/18 0849  BP: 139/78  Pulse: 80  Resp: 16  SpO2: 100%  Weight: 219 lb (99.3 kg)  Height: 5\' 3"  (1.6 m)    Physical Exam  Constitutional: She is oriented to person, place, and time. She appears well-developed and well-nourished. No distress.  HENT:  Head: Normocephalic and atraumatic.  Nose: Nose normal.  Mouth/Throat: Oropharynx is clear and moist. No oropharyngeal exudate.  Eyes: Pupils are equal, round, and reactive to light. Conjunctivae and EOM are normal.  Neck: Normal range of motion. Neck supple. No JVD present. No tracheal deviation present. No thyromegaly present.  Cardiovascular: Normal rate, regular rhythm and normal heart sounds. Exam reveals no gallop and no friction rub.  No murmur heard. Pulmonary/Chest: Effort normal. No respiratory distress. She has no wheezes. She has no rales. She exhibits no tenderness.  Abdominal: Soft. Bowel sounds are normal. There is no tenderness.  Musculoskeletal: Normal range of motion.  Lymphadenopathy:    She has no cervical adenopathy.  Neurological: She is alert and oriented to person, place, and time. No cranial nerve deficit.  Skin: Skin is warm and dry. She is not diaphoretic.  Psychiatric: She has a normal mood and affect. Her behavior is normal.  Judgment and thought content normal.  Nursing note and vitals reviewed.  Assessment/Plan: 1. Other fatigue Improving with improvement in diet and exercise. Reviewed labs which were all normal.   2. Moderate obesity Ten pound weight loss since her last visit. Continue phentermine daily. Limit calorie intake to 1500 calories per day and routine exercise program.  - phentermine (ADIPEX-P) 37.5 MG tablet; Take 1 tablet (37.5 mg total) by mouth daily before breakfast.  Dispense: 30 tablet; Refill: 1  General Counseling: Kaysie verbalizes understanding of the findings of todays visit and agrees with plan of treatment. I have discussed any further diagnostic evaluation that may be needed or ordered today. We also reviewed her medications today. she has been encouraged to call the office with any questions or concerns that should arise related to todays visit.  Counseling:   There is a liability release in patients' chart. There has been a 10 minute discussion about the side effects including but not limited to elevated blood pressure, anxiety, lack of sleep and dry mouth. Pt understands and will like to start/continue on appetite suppressant at this time. There will be one month RX given at the time of visit with proper follow up. Nova diet plan with restricted calories is given to the pt. Pt understands and agrees with  plan of treatment  This patient was seen by Vincent Gros, FNP- C in Collaboration with Dr Lyndon Code as a part of collaborative care agreement    Meds ordered this encounter  Medications  . phentermine (ADIPEX-P) 37.5 MG tablet    Sig: Take 1 tablet (37.5 mg total) by mouth daily before breakfast.    Dispense:  30 tablet    Refill:  1    Order Specific Question:   Supervising Provider    Answer:   Lyndon Code [[id number]]    Time spent: 26 Minutes          Dr Lyndon Code Internal medicine

## 2018-04-21 ENCOUNTER — Other Ambulatory Visit: Payer: Self-pay | Admitting: Obstetrics and Gynecology

## 2018-04-21 DIAGNOSIS — Z3044 Encounter for surveillance of vaginal ring hormonal contraceptive device: Secondary | ICD-10-CM

## 2018-05-21 ENCOUNTER — Ambulatory Visit: Payer: Self-pay | Admitting: Nurse Practitioner

## 2018-05-27 ENCOUNTER — Ambulatory Visit: Payer: Self-pay | Admitting: Nurse Practitioner

## 2018-05-29 ENCOUNTER — Encounter: Payer: Self-pay | Admitting: Nurse Practitioner

## 2018-05-29 ENCOUNTER — Ambulatory Visit: Payer: PRIVATE HEALTH INSURANCE | Admitting: Nurse Practitioner

## 2018-05-29 DIAGNOSIS — R5383 Other fatigue: Secondary | ICD-10-CM

## 2018-05-29 DIAGNOSIS — E668 Other obesity: Secondary | ICD-10-CM | POA: Diagnosis not present

## 2018-05-29 MED ORDER — PHENTERMINE HCL 37.5 MG PO TABS: 37.5000 mg | ORAL_TABLET | Freq: Every day | ORAL | 1 refills | Status: DC

## 2018-05-29 NOTE — Progress Notes (Signed)
[facility] [address] [place], [place] [address]  Internal MEDICINE  Office Visit Note   Patient Name: SAPIR LAVEY  [id number]  [id number]  Date of Service: 06/10/2018  Chief Complaint  Patient presents with  . Obesity    1 month follow up weight management    The patient has had a 3 pound weight loss since her last visit. Continues on phentermine to help with weight loss. Continues to do well. Has noted some nausea when she takes the medication. Is taking it with stool softeners. Has some constipation.       Current Medication: Outpatient Encounter Medications as of 05/29/2018  Medication Sig  . Docusate Sodium (STOOL SOFTENER LAXATIVE PO) Take by mouth.  . etonogestrel-ethinyl estradiol (NUVARING) 0.12-0.015 MG/24HR vaginal ring INSERT VAGINALLY AND LEAVE IN PLACE FOR 3 CONSECUTIVE WEEKS, THEN REMOVE FOR 1 WEEK  . Liraglutide -Weight Management (SAXENDA) 18 MG/3ML SOPN Inject 0.6 mg into the skin daily. Inject once daily and titrate up 0.6 each week to full dose at 3.0 mg  . phentermine (ADIPEX-P) 37.5 MG tablet Take 1 tablet (37.5 mg total) by mouth daily before breakfast.  . [DISCONTINUED] phentermine (ADIPEX-P) 37.5 MG tablet Take 1 tablet (37.5 mg total) by mouth daily before breakfast.   No facility-administered encounter medications on file as of 05/29/2018.     Surgical History: Past Surgical History:  Procedure Laterality Date  . TONSILLECTOMY    . WISDOM TOOTH EXTRACTION      Medical History: Past Medical History:  Diagnosis Date  . Anxiety   . Ovarian cyst   . Pelvic pain   . Vaccine for human papilloma virus (HPV) types 6, 11, 16, and 18 administered     Family History: Family History  Problem Relation Age of Onset  . Diabetes Father     Social History   Socioeconomic History  . Marital status: Single    Spouse name: Not on file  . Number of children: Not on file  . Years of education: Not on file  . Highest education  level: Not on file  Occupational History  . Not on file  Social Needs  . Financial resource strain: Not on file  . Food insecurity:    Worry: Not on file    Inability: Not on file  . Transportation needs:    Medical: Not on file    Non-medical: Not on file  Tobacco Use  . Smoking status: Former Smoker    Years: 1.00    Last attempt to quit: 10/14/2014    Years since quitting: 3.6  . Smokeless tobacco: Never Used  Substance and Sexual Activity  . Alcohol use: Yes    Alcohol/week: 0.0 standard drinks  . Drug use: No  . Sexual activity: Yes    Birth control/protection: Inserts  Lifestyle  . Physical activity:    Days per week: Not on file    Minutes per session: Not on file  . Stress: Not on file  Relationships  . Social connections:    Talks on phone: Not on file    Gets together: Not on file    Attends religious service: Not on file    Active member of club or organization: Not on file    Attends meetings of clubs or organizations: Not on file    Relationship status: Not on file  . Intimate partner violence:    Fear of current or ex partner: Not on file    Emotionally abused: Not  on file    Physically abused: Not on file    Forced sexual activity: Not on file  Other Topics Concern  . Not on file  Social History Narrative  . Not on file      Review of Systems  Constitutional: Positive for fatigue. Negative for activity change, chills and unexpected weight change.       Weight loss of 3 pounds since her last visit .  HENT: Negative for congestion, postnasal drip, rhinorrhea, sneezing and sore throat.   Eyes: Negative.  Negative for redness.  Respiratory: Negative for cough, chest tightness, shortness of breath and wheezing.   Cardiovascular: Negative for chest pain and palpitations.  Gastrointestinal: Positive for constipation. Negative for abdominal pain, diarrhea, nausea and vomiting.  Endocrine: Negative for cold intolerance, heat intolerance, polydipsia,  polyphagia and polyuria.  Genitourinary: Negative.  Negative for dysuria and frequency.  Musculoskeletal: Negative for arthralgias, back pain, joint swelling, myalgias and neck pain.  Skin: Negative for rash.  Allergic/Immunologic: Negative for environmental allergies.  Neurological: Negative for tremors, numbness and headaches.  Hematological: Negative for adenopathy. Does not bruise/bleed easily.  Psychiatric/Behavioral: Negative for behavioral problems (Depression), sleep disturbance and suicidal ideas. The patient is not nervous/anxious.     Today's Vitals   05/29/18 1113  BP: 135/70  Pulse: 91  Resp: 16  SpO2: 99%  Weight: 216 lb (98 kg)  Height: 5\' 3"  (1.6 m)   Physical Exam  Constitutional: She is oriented to person, place, and time. She appears well-developed and well-nourished. No distress.  HENT:  Head: Normocephalic and atraumatic.  Nose: Nose normal.  Mouth/Throat: Oropharynx is clear and moist. No oropharyngeal exudate.  Eyes: Pupils are equal, round, and reactive to light. Conjunctivae and EOM are normal.  Neck: Normal range of motion. Neck supple. No JVD present. No tracheal deviation present. No thyromegaly present.  Cardiovascular: Normal rate, regular rhythm and normal heart sounds. Exam reveals no gallop and no friction rub.  No murmur heard. Pulmonary/Chest: Effort normal. No respiratory distress. She has no wheezes. She has no rales. She exhibits no tenderness.  Abdominal: Soft. Bowel sounds are normal. There is no tenderness.  Musculoskeletal: Normal range of motion.  Lymphadenopathy:    She has no cervical adenopathy.  Neurological: She is alert and oriented to person, place, and time. No cranial nerve deficit.  Skin: Skin is warm and dry. She is not diaphoretic.  Psychiatric: She has a normal mood and affect. Her behavior is normal. Judgment and thought content normal.  Nursing note and vitals reviewed.  Assessment/Plan: 1. Other fatigue Improved.  Continue to monitor.   2. Moderate obesity Continues to improve. Ok to continue phentermine 37.5mg  tablets daily. Follow low calorie, low-fat diet. Exercise regularly.  - phentermine (ADIPEX-P) 37.5 MG tablet; Take 1 tablet (37.5 mg total) by mouth daily before breakfast.  Dispense: 30 tablet; Refill: 1  General Counseling: Ethelyn verbalizes understanding of the findings of todays visit and agrees with plan of treatment. I have discussed any further diagnostic evaluation that may be needed or ordered today. We also reviewed her medications today. she has been encouraged to call the office with any questions or concerns that should arise related to todays visit.    There is a liability release in patients' chart. There has been a 10 minute discussion about the side effects including but not limited to elevated blood pressure, anxiety, lack of sleep and dry mouth. Pt understands and will like to start/continue on appetite suppressant at this time.  There will be one month RX given at the time of visit with proper follow up. Nova diet plan with restricted calories is given to the pt. Pt understands and agrees with  plan of treatment  This patient was seen by Vincent GrosHeather Francia Verry FNP Collaboration with Dr Lyndon CodeFozia M Khan as a part of collaborative care agreement   Meds ordered this encounter  Medications  . phentermine (ADIPEX-P) 37.5 MG tablet    Sig: Take 1 tablet (37.5 mg total) by mouth daily before breakfast.    Dispense:  30 tablet    Refill:  1    Order Specific Question:   Supervising Provider    Answer:   Lyndon CodeKHAN, FOZIA M [[id number]]    Time spent: 6815 Minutes      Dr Lyndon CodeFozia M Khan Internal medicine

## 2018-06-29 ENCOUNTER — Other Ambulatory Visit (HOSPITAL_COMMUNITY)
Admission: RE | Admit: 2018-06-29 | Discharge: 2018-06-29 | Disposition: A | Discharge status: Home / Self Care | Payer: PRIVATE HEALTH INSURANCE | Source: Ambulatory Visit | Attending: Obstetrics and Gynecology | Admitting: Obstetrics and Gynecology

## 2018-06-29 ENCOUNTER — Encounter: Payer: Self-pay | Admitting: Obstetrics and Gynecology

## 2018-06-29 ENCOUNTER — Ambulatory Visit (INDEPENDENT_AMBULATORY_CARE_PROVIDER_SITE_OTHER): Payer: PRIVATE HEALTH INSURANCE | Admitting: Obstetrics and Gynecology

## 2018-06-29 VITALS — BP 110/70 | Ht 63.0 in | Wt 223.0 lb

## 2018-06-29 DIAGNOSIS — N898 Other specified noninflammatory disorders of vagina: Secondary | ICD-10-CM | POA: Diagnosis not present

## 2018-06-29 DIAGNOSIS — Z124 Encounter for screening for malignant neoplasm of cervix: Secondary | ICD-10-CM | POA: Insufficient documentation

## 2018-06-29 DIAGNOSIS — Z3044 Encounter for surveillance of vaginal ring hormonal contraceptive device: Secondary | ICD-10-CM

## 2018-06-29 DIAGNOSIS — Z01419 Encounter for gynecological examination (general) (routine) without abnormal findings: Secondary | ICD-10-CM

## 2018-06-29 LAB — POCT WET PREP WITH KOH
Clue Cells Wet Prep HPF POC: NEGATIVE
KOH PREP POC: NEGATIVE
Trichomonas, UA: NEGATIVE
Yeast Wet Prep HPF POC: NEGATIVE

## 2018-06-29 MED ORDER — ETONOGESTREL-ETHINYL ESTRADIOL 0.12-0.015 MG/24HR VA RING: VAGINAL_RING | VAGINAL | 3 refills | Status: DC

## 2018-06-29 NOTE — Patient Instructions (Signed)
I value your feedback and entrusting us with your care. If you get a [facility] patient survey, I would appreciate you taking the time to let us know about your experience today. Thank you! 

## 2018-06-29 NOTE — Progress Notes (Signed)
PCP:  Carlean JewsBoscia, Heather E, NP   Chief Complaint  Patient presents with  . Gynecologic Exam     HPI:      [name] is a 27 y.o. G0P0000 who LMP was Patient's last menstrual period was 06/20/2018 (exact date)., presents today for her annual examination.  Her menses are regular every 28-30 days, lasting 4-5 days.  Dysmenorrhea mild, occurring premenstrually. She does not usually have intermenstrual bleeding but did have it 2 cycles in a row when losing wt. Sx resolved.   Sex activity: single partner, contraception - NuvaRing vaginal inserts.  Last Pap: 06/26/17  Results were: no abnormalities  Hx of STDs: none Has had increased vag d/c without fishy odor/irritation the past couple wks. Not usual for pt. Sx improve if doesn't wear underwear or wears cotton underwear.  There is no FH of breast cancer. There is no FH of ovarian cancer. The patient does do self-breast exams.  Tobacco use: few cigs infrequently Alcohol use: social drinker No drug use.  Exercise: mod active  She does get adequate calcium and Vitamin D in her diet.  LLQ pain resolved. Usually happens if pt does keto diet.   Past Medical History:  Diagnosis Date  . Anxiety   . Ovarian cyst   . Pelvic pain   . Vaccine for human papilloma virus (HPV) types 6, 11, 16, and 18 administered     Past Surgical History:  Procedure Laterality Date  . TONSILLECTOMY    . WISDOM TOOTH EXTRACTION      Family History  Problem Relation Age of Onset  . Diabetes Father     Social History   Socioeconomic History  . Marital status: Single    Spouse name: Not on file  . Number of children: Not on file  . Years of education: Not on file  . Highest education level: Not on file  Occupational History  . Not on file  Social Needs  . Financial resource strain: Not on file  . Food insecurity:    Worry: Not on file    Inability: Not on file  . Transportation needs:    Medical: Not on file    Non-medical: Not on  file  Tobacco Use  . Smoking status: Current Some Day Smoker    Years: 1.00    Last attempt to quit: 10/14/2014    Years since quitting: 3.7  . Smokeless tobacco: Never Used  Substance and Sexual Activity  . Alcohol use: Yes    Alcohol/week: 0.0 standard drinks  . Drug use: No  . Sexual activity: Yes    Birth control/protection: Inserts  Lifestyle  . Physical activity:    Days per week: Not on file    Minutes per session: Not on file  . Stress: Not on file  Relationships  . Social connections:    Talks on phone: Not on file    Gets together: Not on file    Attends religious service: Not on file    Active member of club or organization: Not on file    Attends meetings of clubs or organizations: Not on file    Relationship status: Not on file  . Intimate partner violence:    Fear of current or ex partner: Not on file    Emotionally abused: Not on file    Physically abused: Not on file    Forced sexual activity: Not on file  Other Topics Concern  . Not on file  Social History  Narrative  . Not on file    Current Meds  Medication Sig  . Docusate Sodium (STOOL SOFTENER LAXATIVE PO) Take by mouth.  . etonogestrel-ethinyl estradiol (NUVARING) 0.12-0.015 MG/24HR vaginal ring INSERT VAGINALLY AND LEAVE IN PLACE FOR 3 CONSECUTIVE WEEKS, THEN REMOVE FOR 1 WEEK  . phentermine (ADIPEX-P) 37.5 MG tablet Take 1 tablet (37.5 mg total) by mouth daily before breakfast.  . [DISCONTINUED] etonogestrel-ethinyl estradiol (NUVARING) 0.12-0.015 MG/24HR vaginal ring INSERT VAGINALLY AND LEAVE IN PLACE FOR 3 CONSECUTIVE WEEKS, THEN REMOVE FOR 1 WEEK     ROS:  Review of Systems  Constitutional: Negative for fatigue, fever and unexpected weight change.  Respiratory: Negative for cough, shortness of breath and wheezing.   Cardiovascular: Negative for chest pain, palpitations and leg swelling.  Gastrointestinal: Negative for blood in stool, constipation, diarrhea, nausea and vomiting.  Endocrine:  Negative for cold intolerance, heat intolerance and polyuria.  Genitourinary: Positive for vaginal discharge. Negative for dyspareunia, dysuria, flank pain, frequency, genital sores, hematuria, menstrual problem, pelvic pain, urgency, vaginal bleeding and vaginal pain.  Musculoskeletal: Negative for back pain, joint swelling and myalgias.  Skin: Negative for rash.  Neurological: Negative for dizziness, syncope, light-headedness, numbness and headaches.  Hematological: Negative for adenopathy.  Psychiatric/Behavioral: Negative for agitation, confusion, sleep disturbance and suicidal ideas. The patient is not nervous/anxious.       Objective: BP 110/70   Ht 5\' 3"  (1.6 m)   Wt 223 lb (101.2 kg)   LMP 06/20/2018 (Exact Date)   BMI 39.50 kg/m    Physical Exam  Constitutional: She is oriented to person, place, and time. She appears well-developed and well-nourished.  Genitourinary: Vagina normal and uterus normal. There is no rash or tenderness on the right labia. There is no rash or tenderness on the left labia. No erythema or tenderness in the vagina. No vaginal discharge found. Right adnexum does not display mass and does not display tenderness. Left adnexum does not display mass and does not display tenderness. Cervix does not exhibit motion tenderness or polyp. Uterus is not enlarged or tender.  Neck: Normal range of motion. No thyromegaly present.  Cardiovascular: Normal rate, regular rhythm and normal heart sounds.  No murmur heard. Pulmonary/Chest: Effort normal and breath sounds normal. Right breast exhibits no mass, no nipple discharge, no skin change and no tenderness. Left breast exhibits no mass, no nipple discharge, no skin change and no tenderness.  Abdominal: Soft. There is no tenderness. There is no guarding.  Musculoskeletal: Normal range of motion.  Neurological: She is alert and oriented to person, place, and time. No cranial nerve deficit.  Psychiatric: She has a normal  mood and affect. Her behavior is normal.  Vitals reviewed.   Assessment/Plan: Encounter for annual routine gynecological examination  Cervical cancer screening - Plan: Cytology - PAP  Encounter for surveillance of vaginal ring hormonal contraceptive device - Nuvaring Rx RF.  - Plan: etonogestrel-ethinyl estradiol (NUVARING) 0.12-0.015 MG/24HR vaginal ring  Vaginal discharge - NEg wet prep. Reassurance. F/u prn. - Plan: POCT Wet Prep with KOH  Meds ordered this encounter  Medications  . etonogestrel-ethinyl estradiol (NUVARING) 0.12-0.015 MG/24HR vaginal ring    Sig: INSERT VAGINALLY AND LEAVE IN PLACE FOR 3 CONSECUTIVE WEEKS, THEN REMOVE FOR 1 WEEK    Dispense:  3 each    Refill:  3    Order Specific Question:   Supervising Provider    Answer:   Nadara Mustard [[id number]]  GYN counsel adequate intake of calcium and vitamin D, diet and exercise     F/U  Return in about 1 year (around 06/30/2019).  Joslyn Ramos B. Adlene Adduci, PA-C 06/29/2018 9:23 AM

## 2018-06-30 LAB — CYTOLOGY - PAP
ADEQUACY: ABSENT
Diagnosis: NEGATIVE

## 2018-07-01 ENCOUNTER — Ambulatory Visit: Payer: Self-pay | Admitting: Adult Health

## 2018-07-10 ENCOUNTER — Encounter: Payer: Self-pay | Admitting: Nurse Practitioner

## 2018-07-10 ENCOUNTER — Ambulatory Visit: Payer: PRIVATE HEALTH INSURANCE | Admitting: Nurse Practitioner

## 2018-07-10 VITALS — BP 124/80 | HR 73 | Resp 16 | Ht 63.0 in | Wt 219.0 lb

## 2018-07-10 DIAGNOSIS — E669 Obesity, unspecified: Secondary | ICD-10-CM

## 2018-07-10 DIAGNOSIS — R5383 Other fatigue: Secondary | ICD-10-CM | POA: Diagnosis not present

## 2018-07-10 DIAGNOSIS — E668 Other obesity: Secondary | ICD-10-CM

## 2018-07-10 MED ORDER — PHENTERMINE HCL 37.5 MG PO TABS: 37.5000 mg | ORAL_TABLET | Freq: Every day | ORAL | 1 refills | Status: DC

## 2018-07-10 NOTE — Progress Notes (Signed)
[facility] [address] [place], [place] [address]  Internal MEDICINE  Office Visit Note  Patient Name: Hannah Stevens  [id number]  [id number]  Date of Service: 07/21/2018  Chief Complaint  Patient presents with  . Medical Management of Chronic Issues    6wk follow up weight management    The patient has had three pound weight gain since her most recent, routine follow up for weight management. She had not really been making good diet choices and had not been exercising regularly. Over past two weeks, she has gotten back into a good routine. She is concentrating on limiting her calorie count to 1500 calories and she is back to exercising three to four times per week. She reports increased energy and much less fatigue.       Current Medication: Outpatient Encounter Medications as of 07/10/2018  Medication Sig  . Docusate Sodium (STOOL SOFTENER LAXATIVE PO) Take by mouth.  . etonogestrel-ethinyl estradiol (NUVARING) 0.12-0.015 MG/24HR vaginal ring INSERT VAGINALLY AND LEAVE IN PLACE FOR 3 CONSECUTIVE WEEKS, THEN REMOVE FOR 1 WEEK  . phentermine (ADIPEX-P) 37.5 MG tablet Take 1 tablet (37.5 mg total) by mouth daily before breakfast.  . [DISCONTINUED] phentermine (ADIPEX-P) 37.5 MG tablet Take 1 tablet (37.5 mg total) by mouth daily before breakfast.   No facility-administered encounter medications on file as of 07/10/2018.     Surgical History: Past Surgical History:  Procedure Laterality Date  . TONSILLECTOMY    . WISDOM TOOTH EXTRACTION      Medical History: Past Medical History:  Diagnosis Date  . Anxiety   . Ovarian cyst   . Pelvic pain   . Vaccine for human papilloma virus (HPV) types 6, 11, 16, and 18 administered     Family History: Family History  Problem Relation Age of Onset  . Diabetes Father     Social History   Socioeconomic History  . Marital status: Single    Spouse name: Not on file  . Number of children: Not on file  . Years of  education: Not on file  . Highest education level: Not on file  Occupational History  . Not on file  Social Needs  . Financial resource strain: Not on file  . Food insecurity:    Worry: Not on file    Inability: Not on file  . Transportation needs:    Medical: Not on file    Non-medical: Not on file  Tobacco Use  . Smoking status: Former Smoker    Years: 1.00    Last attempt to quit: 10/14/2014    Years since quitting: 3.7  . Smokeless tobacco: Never Used  Substance and Sexual Activity  . Alcohol use: Yes    Alcohol/week: 0.0 standard drinks  . Drug use: No  . Sexual activity: Yes    Birth control/protection: Inserts  Lifestyle  . Physical activity:    Days per week: Not on file    Minutes per session: Not on file  . Stress: Not on file  Relationships  . Social connections:    Talks on phone: Not on file    Gets together: Not on file    Attends religious service: Not on file    Active member of club or organization: Not on file    Attends meetings of clubs or organizations: Not on file    Relationship status: Not on file  . Intimate partner violence:    Fear of current or ex partner: Not on file  Emotionally abused: Not on file    Physically abused: Not on file    Forced sexual activity: Not on file  Other Topics Concern  . Not on file  Social History Narrative  . Not on file      Review of Systems  Constitutional: Negative for activity change, chills, fatigue and unexpected weight change.       Weight loss of 4 pounds since most recent visit.   HENT: Negative for congestion, postnasal drip, rhinorrhea, sneezing and sore throat.   Eyes: Negative.  Negative for redness.  Respiratory: Negative for cough, chest tightness, shortness of breath and wheezing.   Cardiovascular: Negative for chest pain and palpitations.  Gastrointestinal: Negative for abdominal pain, constipation, diarrhea, nausea and vomiting.  Endocrine: Negative for cold intolerance, heat  intolerance, polydipsia, polyphagia and polyuria.  Genitourinary: Negative.  Negative for dysuria and frequency.  Musculoskeletal: Negative for arthralgias, back pain, joint swelling, myalgias and neck pain.  Skin: Negative for rash.  Allergic/Immunologic: Negative for environmental allergies.  Neurological: Negative for tremors, numbness and headaches.  Hematological: Negative for adenopathy. Does not bruise/bleed easily.  Psychiatric/Behavioral: Negative for behavioral problems (Depression), dysphoric mood, sleep disturbance and suicidal ideas. The patient is not nervous/anxious.     Today's Vitals   07/10/18 1100  BP: 124/80  Pulse: 73  Resp: 16  SpO2: 97%  Weight: 219 lb (99.3 kg)  Height: 5\' 3"  (1.6 m)    Physical Exam  Constitutional: She is oriented to person, place, and time. She appears well-developed and well-nourished. No distress.  HENT:  Head: Normocephalic and atraumatic.  Nose: Nose normal.  Mouth/Throat: Oropharynx is clear and moist. No oropharyngeal exudate.  Eyes: Pupils are equal, round, and reactive to light. Conjunctivae and EOM are normal.  Neck: Normal range of motion. Neck supple. No JVD present. No tracheal deviation present. No thyromegaly present.  Cardiovascular: Normal rate, regular rhythm and normal heart sounds. Exam reveals no gallop and no friction rub.  No murmur heard. Pulmonary/Chest: Effort normal and breath sounds normal. No respiratory distress. She has no wheezes. She has no rales. She exhibits no tenderness.  Abdominal: Soft. Bowel sounds are normal. There is no tenderness.  Musculoskeletal: Normal range of motion.  Lymphadenopathy:    She has no cervical adenopathy.  Neurological: She is alert and oriented to person, place, and time. No cranial nerve deficit.  Skin: Skin is warm and dry. Capillary refill takes less than 2 seconds. She is not diaphoretic.  Psychiatric: She has a normal mood and affect. Her behavior is normal. Judgment and  thought content normal.  Nursing note and vitals reviewed.   Assessment/Plan: 1. Other fatigue Improved. Will continue to monitor.   2. Moderate obesity Improving. Continue phentermine daily. Limit calorie intake to 1500 calories per day and participate in regular exercise three to four times per week, for 30 to 45 minutes.  - phentermine (ADIPEX-P) 37.5 MG tablet; Take 1 tablet (37.5 mg total) by mouth daily before breakfast.  Dispense: 30 tablet; Refill: 1  General Counseling: Hannah Stevens verbalizes understanding of the findings of todays visit and agrees with plan of treatment. I have discussed any further diagnostic evaluation that may be needed or ordered today. We also reviewed her medications today. she has been encouraged to call the office with any questions or concerns that should arise related to todays visit.   There is a liability release in patients' chart. There has been a 10 minute discussion about the side effects including but not  limited to elevated blood pressure, anxiety, lack of sleep and dry mouth. Pt understands and will like to start/continue on appetite suppressant at this time. There will be one month RX given at the time of visit with proper follow up. Nova diet plan with restricted calories is given to the pt. Pt understands and agrees with  plan of treatment  This patient was seen by Vincent Gros FNP Collaboration with Dr Lyndon Code as a part of collaborative care agreement  Meds ordered this encounter  Medications  . phentermine (ADIPEX-P) 37.5 MG tablet    Sig: Take 1 tablet (37.5 mg total) by mouth daily before breakfast.    Dispense:  30 tablet    Refill:  1    Order Specific Question:   Supervising Provider    Answer:   Lyndon Code [[id number]]    Time spent: 37 Minutes      Dr Lyndon Code Internal medicine

## 2018-08-24 ENCOUNTER — Ambulatory Visit: Payer: Self-pay | Admitting: Nurse Practitioner

## 2018-09-04 ENCOUNTER — Encounter: Payer: Self-pay | Admitting: Nurse Practitioner

## 2018-09-04 ENCOUNTER — Ambulatory Visit: Payer: PRIVATE HEALTH INSURANCE | Admitting: Nurse Practitioner

## 2018-09-04 VITALS — BP 132/76 | HR 89 | Resp 16 | Ht 63.0 in | Wt 216.0 lb

## 2018-09-04 DIAGNOSIS — K5909 Other constipation: Secondary | ICD-10-CM | POA: Diagnosis not present

## 2018-09-04 DIAGNOSIS — E668 Other obesity: Secondary | ICD-10-CM | POA: Diagnosis not present

## 2018-09-04 MED ORDER — LINACLOTIDE 145 MCG PO CAPS: 145.0000 ug | ORAL_CAPSULE | Freq: Every day | ORAL | 0 refills | Status: DC

## 2018-09-04 MED ORDER — PHENTERMINE HCL 37.5 MG PO TABS: 37.5000 mg | ORAL_TABLET | Freq: Every day | ORAL | 1 refills | Status: DC

## 2018-09-04 NOTE — Progress Notes (Signed)
[facility] [address] [place], [place] [address]  Internal MEDICINE  Office Visit Note  Patient Name: Hannah Stevens  [id number]  [id number]  Date of Service: 09/04/2018  Chief Complaint  Patient presents with  . Medical Management of Chronic Issues    6 week follow up weight management. pt is concerned about constipation, pt mentioned that it is off and on and she hasnt had a bowel movement since tuesday and stool softners are not helping. and she is drinking plenty of water    Patient arrived to the clinic for weigh loss follow up. Patient got recently engaged and feels very motivated to lose more weight. She exercises five days a week, walks 45 minutes a day and does intermittent fasting. She started doing it about 3 weeks ago and feels very satisfied with results. Patient's weight today is 216 lbs. Patient reports constipation. She stopped drinking her coffee that worked for her as laxative. She usually has BM every day but recently can go without one for 5 days. Her last BM was today. Patient denies any abdominal pain but reports some blood due to her hemorrhoids.  She drinks lots of water. She does not take her Docusate because it makes her nauseous. Patient encouraged to continue with her exercise and diet. Patient advised to try take Miralax  OTC and samples of Linzess are given.       Current Medication: Outpatient Encounter Medications as of 09/04/2018  Medication Sig  . Docusate Sodium (STOOL SOFTENER LAXATIVE PO) Take by mouth.  . etonogestrel-ethinyl estradiol (NUVARING) 0.12-0.015 MG/24HR vaginal ring INSERT VAGINALLY AND LEAVE IN PLACE FOR 3 CONSECUTIVE WEEKS, THEN REMOVE FOR 1 WEEK  . phentermine (ADIPEX-P) 37.5 MG tablet Take 1 tablet (37.5 mg total) by mouth daily before breakfast.  . [DISCONTINUED] phentermine (ADIPEX-P) 37.5 MG tablet Take 1 tablet (37.5 mg total) by mouth daily before breakfast.  . linaclotide (LINZESS) 145 MCG CAPS capsule Take 1  capsule (145 mcg total) by mouth daily before breakfast.   No facility-administered encounter medications on file as of 09/04/2018.     Surgical History: Past Surgical History:  Procedure Laterality Date  . TONSILLECTOMY    . WISDOM TOOTH EXTRACTION      Medical History: Past Medical History:  Diagnosis Date  . Anxiety   . Ovarian cyst   . Pelvic pain   . Vaccine for human papilloma virus (HPV) types 6, 11, 16, and 18 administered     Family History: Family History  Problem Relation Age of Onset  . Diabetes Father     Social History   Socioeconomic History  . Marital status: Single    Spouse name: Not on file  . Number of children: Not on file  . Years of education: Not on file  . Highest education level: Not on file  Occupational History  . Not on file  Social Needs  . Financial resource strain: Not on file  . Food insecurity:    Worry: Not on file    Inability: Not on file  . Transportation needs:    Medical: Not on file    Non-medical: Not on file  Tobacco Use  . Smoking status: Former Smoker    Years: 1.00    Last attempt to quit: 10/14/2014    Years since quitting: 3.8  . Smokeless tobacco: Never Used  Substance and Sexual Activity  . Alcohol use: Yes    Alcohol/week: 0.0 standard drinks  . Drug use: No  .  Sexual activity: Yes    Birth control/protection: Inserts  Lifestyle  . Physical activity:    Days per week: Not on file    Minutes per session: Not on file  . Stress: Not on file  Relationships  . Social connections:    Talks on phone: Not on file    Gets together: Not on file    Attends religious service: Not on file    Active member of club or organization: Not on file    Attends meetings of clubs or organizations: Not on file    Relationship status: Not on file  . Intimate partner violence:    Fear of current or ex partner: Not on file    Emotionally abused: Not on file    Physically abused: Not on file    Forced sexual activity: Not  on file  Other Topics Concern  . Not on file  Social History Narrative  . Not on file      Review of Systems  Constitutional: Negative for fatigue and fever.       Three pounds since most recent visit.   HENT: Negative for congestion, ear pain, facial swelling, rhinorrhea, sinus pain and sore throat.   Respiratory: Negative for apnea, cough, chest tightness, shortness of breath and wheezing.   Cardiovascular: Negative for chest pain, palpitations and leg swelling.  Gastrointestinal: Positive for constipation. Negative for abdominal distention, abdominal pain, nausea and vomiting.  Endocrine: Negative for cold intolerance, heat intolerance, polydipsia, polyphagia and polyuria.  Musculoskeletal: Negative for arthralgias.  Skin: Negative for color change, rash and wound.  Allergic/Immunologic: Negative for environmental allergies.  Neurological: Negative for dizziness, tremors, facial asymmetry, weakness, light-headedness and numbness.    Today's Vitals   09/04/18 0923  BP: 132/76  Pulse: 89  Resp: 16  SpO2: 98%  Weight: 216 lb (98 kg)  Height: 5\' 3"  (1.6 m)     Physical Exam  Constitutional: She is oriented to person, place, and time. She appears well-developed and well-nourished.  Neck: Normal range of motion. Neck supple.  Cardiovascular: Normal rate, regular rhythm and normal heart sounds. Exam reveals no gallop and no friction rub.  No murmur heard. Pulmonary/Chest: Effort normal and breath sounds normal. No respiratory distress. She has no wheezes. She has no rales. She exhibits no tenderness.  Abdominal: Soft. Bowel sounds are normal. She exhibits no distension. There is no tenderness. There is no guarding.  Musculoskeletal: Normal range of motion. She exhibits no edema, tenderness or deformity.  Neurological: She is alert and oriented to person, place, and time.  Skin: Skin is warm and dry.  Psychiatric: She has a normal mood and affect. Her behavior is normal.  Judgment and thought content normal.  Nursing note and vitals reviewed.   Assessment/Plan: 1. Other constipation Patient received some samples linzess daily. encouraged to call back to report effectiveness.  - linaclotide (LINZESS) 145 MCG CAPS capsule; Take 1 capsule (145 mcg total) by mouth daily before breakfast.  Dispense: 30 capsule; Refill: 0  2. Moderate obesity Ok to continue phentermine 37.5mg  tablets daily. conitnue with intermittent fasting and current exercise routine. - phentermine (ADIPEX-P) 37.5 MG tablet; Take 1 tablet (37.5 mg total) by mouth daily before breakfast.  Dispense: 30 tablet; Refill: 1  General Counseling: Shadie verbalizes understanding of the findings of todays visit and agrees with plan of treatment. I have discussed any further diagnostic evaluation that may be needed or ordered today. We also reviewed her medications today. she has been  encouraged to call the office with any questions or concerns that should arise related to todays visit.   There is a liability release in patients' chart. There has been a 10 minute discussion about the side effects including but not limited to elevated blood pressure, anxiety, lack of sleep and dry mouth. Pt understands and will like to start/continue on appetite suppressant at this time. There will be one month RX given at the time of visit with proper follow up. Nova diet plan with restricted calories is given to the pt. Pt understands and agrees with  plan of treatment  This patient was seen by Vincent GrosHeather Lenis Nettleton FNP Collaboration with Dr Lyndon CodeFozia M Khan as a part of collaborative care agreement  Meds ordered this encounter  Medications  . linaclotide (LINZESS) 145 MCG CAPS capsule    Sig: Take 1 capsule (145 mcg total) by mouth daily before breakfast.    Dispense:  30 capsule    Refill:  0    Samples provided today    Order Specific Question:   Supervising Provider    Answer:   Lyndon CodeKHAN, FOZIA M [[id number]]  . phentermine  (ADIPEX-P) 37.5 MG tablet    Sig: Take 1 tablet (37.5 mg total) by mouth daily before breakfast.    Dispense:  30 tablet    Refill:  1    Order Specific Question:   Supervising Provider    Answer:   Lyndon CodeKHAN, FOZIA M [[id number]]    Time spent: 7515 Minutes      Dr Lyndon CodeFozia M Khan Internal medicine

## 2018-10-05 ENCOUNTER — Encounter: Payer: Self-pay | Admitting: Adult Health

## 2018-10-05 ENCOUNTER — Ambulatory Visit: Payer: PRIVATE HEALTH INSURANCE | Admitting: Adult Health

## 2018-10-05 VITALS — BP 110/82 | HR 71 | Resp 16 | Ht 63.0 in | Wt 212.0 lb

## 2018-10-05 DIAGNOSIS — R059 Cough, unspecified: Secondary | ICD-10-CM

## 2018-10-05 DIAGNOSIS — R6889 Other general symptoms and signs: Secondary | ICD-10-CM | POA: Diagnosis not present

## 2018-10-05 DIAGNOSIS — J029 Acute pharyngitis, unspecified: Secondary | ICD-10-CM

## 2018-10-05 DIAGNOSIS — K5909 Other constipation: Secondary | ICD-10-CM | POA: Diagnosis not present

## 2018-10-05 DIAGNOSIS — J0110 Acute frontal sinusitis, unspecified: Secondary | ICD-10-CM

## 2018-10-05 DIAGNOSIS — R05 Cough: Secondary | ICD-10-CM

## 2018-10-05 MED ORDER — FLUTICASONE PROPIONATE 50 MCG/ACT NA SUSP: 2.0000 | Freq: Every day | NASAL | 6 refills | Status: DC

## 2018-10-05 MED ORDER — AMOXICILLIN-POT CLAVULANATE 875-125 MG PO TABS: 1.0000 | ORAL_TABLET | Freq: Two times a day (BID) | ORAL | 0 refills | Status: DC

## 2018-10-05 MED ORDER — LINACLOTIDE 145 MCG PO CAPS: 145.0000 ug | ORAL_CAPSULE | Freq: Every day | ORAL | 0 refills | Status: DC

## 2018-10-05 MED ORDER — HYDROCOD POLST-CPM POLST ER 10-8 MG/5ML PO SUER: 5.0000 mL | Freq: Two times a day (BID) | ORAL | 0 refills | Status: DC | PRN

## 2018-10-05 NOTE — Progress Notes (Signed)
[facility] [address] [place], [place] [id number]  Internal MEDICINE  Office Visit Note  Patient Name: Hannah DalesLeah M Stevens  [birth year]  [id number]  Date of Service: 10/08/2018  Chief Complaint  Patient presents with  . Cough    W/CHEST AND HEAD CONGESTION  . Nausea  . Sore Throat     HPI Pt is here for a sick visit. Pt reports sinus pain/pressure, and sore throat for a week.  She also reports intermittent abdominal pain with nausea.  These episodes last about 15 minutes.  She denies vomiting. She reports she had been constipated for 4-5 days.  She did finally have a bowel movement yesterday.  She is also reporting intense night time cough. She denies fever/chills.       Current Medication:  Outpatient Encounter Medications as of 10/05/2018  Medication Sig  . Docusate Sodium (STOOL SOFTENER LAXATIVE PO) Take by mouth.  . etonogestrel-ethinyl estradiol (NUVARING) 0.12-0.015 MG/24HR vaginal ring INSERT VAGINALLY AND LEAVE IN PLACE FOR 3 CONSECUTIVE WEEKS, THEN REMOVE FOR 1 WEEK  . linaclotide (LINZESS) 145 MCG CAPS capsule Take 1 capsule (145 mcg total) by mouth daily before breakfast.  . phentermine (ADIPEX-P) 37.5 MG tablet Take 1 tablet (37.5 mg total) by mouth daily before breakfast.  . [DISCONTINUED] linaclotide (LINZESS) 145 MCG CAPS capsule Take 1 capsule (145 mcg total) by mouth daily before breakfast.  . amoxicillin-clavulanate (AUGMENTIN) 875-125 MG tablet Take 1 tablet by mouth 2 (two) times daily.  . chlorpheniramine-HYDROcodone (TUSSIONEX PENNKINETIC ER) 10-8 MG/5ML SUER Take 5 mLs by mouth every 12 (twelve) hours as needed for cough.  . fluticasone (FLONASE) 50 MCG/ACT nasal spray Place 2 sprays into both nostrils daily.   No facility-administered encounter medications on file as of 10/05/2018.       Medical History: Past Medical History:  Diagnosis Date  . Anxiety   . Ovarian cyst   . Pelvic pain   . Vaccine for human papilloma virus (HPV)  types 6, 11, 16, and 18 administered      Vital Signs: BP 110/82   Pulse 71   Resp 16   Ht 5\' 3"  (1.6 m)   Wt 212 lb (96.2 kg)   SpO2 99%   BMI 37.55 kg/m    Review of Systems  Constitutional: Negative for chills, fatigue and unexpected weight change.  HENT: Positive for postnasal drip, rhinorrhea, sinus pressure, sinus pain and sore throat. Negative for congestion and sneezing.   Eyes: Negative for photophobia, pain and redness.  Respiratory: Positive for cough. Negative for chest tightness and shortness of breath.   Cardiovascular: Negative for chest pain and palpitations.  Gastrointestinal: Negative for abdominal pain, constipation, diarrhea, nausea and vomiting.  Endocrine: Negative.   Genitourinary: Negative for dysuria and frequency.  Musculoskeletal: Negative for arthralgias, back pain, joint swelling and neck pain.  Skin: Negative for rash.  Allergic/Immunologic: Negative.   Neurological: Negative for tremors and numbness.  Hematological: Negative for adenopathy. Does not bruise/bleed easily.  Psychiatric/Behavioral: Negative for behavioral problems and sleep disturbance. The patient is not nervous/anxious.     Physical Exam Vitals signs and nursing note reviewed.  Constitutional:      General: She is not in acute distress.    Appearance: She is well-developed. She is not diaphoretic.  HENT:     Head: Normocephalic and atraumatic.     Mouth/Throat:     Pharynx: No oropharyngeal exudate.  Eyes:     Pupils: Pupils are equal, round, and reactive to light.  Neck:     Musculoskeletal: Normal range of motion and neck supple.     Thyroid: No thyromegaly.     Vascular: No JVD.     Trachea: No tracheal deviation.  Cardiovascular:     Rate and Rhythm: Normal rate and regular rhythm.     Heart sounds: Normal heart sounds. No murmur. No friction rub. No gallop.   Pulmonary:     Effort: Pulmonary effort is normal. No respiratory distress.     Breath sounds: Normal  breath sounds. No wheezing or rales.  Chest:     Chest wall: No tenderness.  Abdominal:     Palpations: Abdomen is soft.     Tenderness: There is no abdominal tenderness. There is no guarding.  Musculoskeletal: Normal range of motion.  Lymphadenopathy:     Cervical: No cervical adenopathy.  Skin:    General: Skin is warm and dry.  Neurological:     Mental Status: She is alert and oriented to person, place, and time.     Cranial Nerves: No cranial nerve deficit.  Psychiatric:        Behavior: Behavior normal.        Thought Content: Thought content normal.        Judgment: Judgment normal.     Assessment/Plan: 1. Acute non-recurrent frontal sinusitis Patient provided with course of antibiotic.  She is instructed to take medication to completion.  Return to clinic if symptoms fail to improve after antibiotics.  She is to use Flonase as directed. - amoxicillin-clavulanate (AUGMENTIN) 875-125 MG tablet; Take 1 tablet by mouth 2 (two) times daily.  Dispense: 14 tablet; Refill: 0 - fluticasone (FLONASE) 50 MCG/ACT nasal spray; Place 2 sprays into both nostrils daily.  Dispense: 16 g; Refill: 6  2. Sore throat Rapid strep negative at this visit.  - POCT rapid strep A  3. Flu-like symptoms Influenza a and B- at this visit. - POCT Influenza A/B  4. Other constipation Patient reports she was provided with sample of Linzess at previous appointment.  She states that she had good results using Linzess and would like prescription at this time.  Prescription provided. - linaclotide (LINZESS) 145 MCG CAPS capsule; Take 1 capsule (145 mcg total) by mouth daily before breakfast.  Dispense: 30 capsule; Refill: 0  5. Cough Patient given Tussionex cough syrup.  We discussed that she should not drink alcohol or take any other narcotics while using this medication. - chlorpheniramine-HYDROcodone (TUSSIONEX PENNKINETIC ER) 10-8 MG/5ML SUER; Take 5 mLs by mouth every 12 (twelve) hours as needed for  cough.  Dispense: 115 mL; Refill: 0 Reviewed risks and possible side effects associated with taking opiates, benzodiazepines and other CNS depressants. Combination of these could cause dizziness and drowsiness. Advised patient not to drive or operate machinery when taking these medications, as patient's and other's life can be at risk and will have consequences. Patient verbalized understanding in this matter. Dependence and abuse for these drugs will be monitored closely. A Controlled substance policy and procedure is on file which allows [facility] to order a urine drug screen test at any visit. Patient understands and agrees with the plan  General Counseling: Shloka verbalizes understanding of the findings of todays visit and agrees with plan of treatment. I have discussed any further diagnostic evaluation that may be needed or ordered today. We also reviewed her medications today. she has been encouraged to call the office with any questions or concerns that should arise related to todays visit.  Orders Placed This Encounter  Procedures  . POCT rapid strep A  . POCT Influenza A/B    Meds ordered this encounter  Medications  . amoxicillin-clavulanate (AUGMENTIN) 875-125 MG tablet    Sig: Take 1 tablet by mouth 2 (two) times daily.    Dispense:  14 tablet    Refill:  0  . fluticasone (FLONASE) 50 MCG/ACT nasal spray    Sig: Place 2 sprays into both nostrils daily.    Dispense:  16 g    Refill:  6  . chlorpheniramine-HYDROcodone (TUSSIONEX PENNKINETIC ER) 10-8 MG/5ML SUER    Sig: Take 5 mLs by mouth every 12 (twelve) hours as needed for cough.    Dispense:  115 mL    Refill:  0  . linaclotide (LINZESS) 145 MCG CAPS capsule    Sig: Take 1 capsule (145 mcg total) by mouth daily before breakfast.    Dispense:  30 capsule    Refill:  0    Samples provided today    Time spent:25 Minutes  This patient was seen by Blima Ledger AGNP-C in Collaboration with Dr Lyndon Code as  a part of collaborative care agreement.  Johnna Acosta AGNP-C Internal Medicine

## 2018-10-08 ENCOUNTER — Encounter: Payer: Self-pay | Admitting: Adult Health

## 2018-10-08 LAB — POCT INFLUENZA A/B: INFLUENZA A, POC: NEGATIVE

## 2018-10-08 LAB — POCT RAPID STREP A (OFFICE): Rapid Strep A Screen: NEGATIVE

## 2018-10-15 ENCOUNTER — Ambulatory Visit (INDEPENDENT_AMBULATORY_CARE_PROVIDER_SITE_OTHER): Payer: BLUE CROSS/BLUE SHIELD | Admitting: Obstetrics and Gynecology

## 2018-10-15 ENCOUNTER — Encounter: Payer: Self-pay | Admitting: Obstetrics and Gynecology

## 2018-10-15 ENCOUNTER — Ambulatory Visit (INDEPENDENT_AMBULATORY_CARE_PROVIDER_SITE_OTHER): Payer: BLUE CROSS/BLUE SHIELD

## 2018-10-15 VITALS — BP 130/80 | HR 91 | Ht 63.0 in | Wt 212.0 lb

## 2018-10-15 DIAGNOSIS — Z3202 Encounter for pregnancy test, result negative: Secondary | ICD-10-CM

## 2018-10-15 DIAGNOSIS — R1031 Right lower quadrant pain: Secondary | ICD-10-CM | POA: Diagnosis not present

## 2018-10-15 LAB — POCT URINE PREGNANCY: Preg Test, Ur: NEGATIVE

## 2018-10-15 NOTE — Progress Notes (Signed)
Hannah Stevens, Hannah E, NP   Chief Complaint  Patient presents with  . Pelvic Pain    right side only x 4 days, and has happened twice monday night and once this morning    HPI:      Hannah Stevens is a 28 y.o. G0P0000 who LMP was Patient's last menstrual period was 10/10/2018 (exact date)., presents today for severe RLQ pain that started 4 days ago. Pain is episodic and sharp and pt can't get comfortable. Never felt anything like this before and pt screams due to pain. Pain is from RLQ to RT pubic area and feels like being cut with scalpel. Pain lasts a few min then resolves and pt is fine. No pain with palpation if not having episode. No known triggers. Happened twice Monday, and this morning. No urin or vag sx. No GI sx. S/p viral gastroenteritis 12/26-12/28/19. Had fever then but none now. Hx of ovar cysts in past.  She is sex active, uses nuvaring. LMP last wk was normal. No new partners.    Past Medical History:  Diagnosis Date  . Anxiety   . Ovarian cyst   . Pelvic pain   . Vaccine for human papilloma virus (HPV) types 6, 11, 16, and 18 administered     Past Surgical History:  Procedure Laterality Date  . TONSILLECTOMY    . WISDOM TOOTH EXTRACTION      Family History  Problem Relation Age of Onset  . Diabetes Father     Social History   Socioeconomic History  . Marital status: Single    Spouse name: Not on file  . Number of children: Not on file  . Years of education: Not on file  . Highest education level: Not on file  Occupational History  . Not on file  Social Needs  . Financial resource strain: Not on file  . Food insecurity:    Worry: Not on file    Inability: Not on file  . Transportation needs:    Medical: Not on file    Non-medical: Not on file  Tobacco Use  . Smoking status: Former Smoker    Years: 1.00    Last attempt to quit: 10/14/2014    Years since quitting: 4.0  . Smokeless tobacco: Never Used  Substance and Sexual Activity  .  Alcohol use: Yes    Alcohol/week: 0.0 standard drinks  . Drug use: No  . Sexual activity: Yes    Birth control/protection: Inserts  Lifestyle  . Physical activity:    Days per week: Not on file    Minutes per session: Not on file  . Stress: Not on file  Relationships  . Social connections:    Talks on phone: Not on file    Gets together: Not on file    Attends religious service: Not on file    Active member of club or organization: Not on file    Attends meetings of clubs or organizations: Not on file    Relationship status: Not on file  . Intimate partner violence:    Fear of current or ex partner: Not on file    Emotionally abused: Not on file    Physically abused: Not on file    Forced sexual activity: Not on file  Other Topics Concern  . Not on file  Social History Narrative  . Not on file    Outpatient Medications Prior to Visit  Medication Sig Dispense Refill  . etonogestrel-ethinyl estradiol (NUVARING)  0.12-0.015 MG/24HR vaginal ring INSERT VAGINALLY AND LEAVE IN PLACE FOR 3 CONSECUTIVE WEEKS, THEN REMOVE FOR 1 WEEK 3 each 3  . phentermine (ADIPEX-P) 37.5 MG tablet Take 1 tablet (37.5 mg total) by mouth daily before breakfast. 30 tablet 1  . Docusate Sodium (STOOL SOFTENER LAXATIVE PO) Take by mouth.    . fluticasone (FLONASE) 50 MCG/ACT nasal spray Place 2 sprays into both nostrils daily. (Patient not taking: Reported on 10/15/2018) 16 g 6  . amoxicillin-clavulanate (AUGMENTIN) 875-125 MG tablet Take 1 tablet by mouth 2 (two) times daily. 14 tablet 0  . chlorpheniramine-HYDROcodone (TUSSIONEX PENNKINETIC ER) 10-8 MG/5ML SUER Take 5 mLs by mouth every 12 (twelve) hours as needed for cough. 115 mL 0  . linaclotide (LINZESS) 145 MCG CAPS capsule Take 1 capsule (145 mcg total) by mouth daily before breakfast. 30 capsule 0   No facility-administered medications prior to visit.       ROS:  Review of Systems  Constitutional: Negative for fatigue, fever and unexpected  weight change.  Respiratory: Negative for cough, shortness of breath and wheezing.   Cardiovascular: Negative for chest pain, palpitations and leg swelling.  Gastrointestinal: Negative for blood in stool, constipation, diarrhea, nausea and vomiting.  Endocrine: Negative for cold intolerance, heat intolerance and polyuria.  Genitourinary: Positive for pelvic pain. Negative for dyspareunia, dysuria, flank pain, frequency, genital sores, hematuria, menstrual problem, urgency, vaginal bleeding, vaginal discharge and vaginal pain.  Musculoskeletal: Negative for back pain, joint swelling and myalgias.  Skin: Negative for rash.  Neurological: Negative for dizziness, syncope, light-headedness, numbness and headaches.  Hematological: Negative for adenopathy.  Psychiatric/Behavioral: Negative for agitation, confusion, sleep disturbance and suicidal ideas. The patient is not nervous/anxious.      OBJECTIVE:   Vitals:  BP 130/80   Pulse 91   Ht 5\' 3"  (1.6 m)   Wt 212 lb (96.2 kg)   LMP 10/10/2018 (Exact Date)   BMI 37.55 kg/m   Physical Exam Vitals signs reviewed.  Constitutional:      Appearance: She is well-developed.  Pulmonary:     Effort: Pulmonary effort is normal.  Abdominal:     Palpations: Abdomen is soft.     Tenderness: There is no abdominal tenderness. There is no guarding or rebound.  Genitourinary:    Pubic Area: No rash.      Labia:        Right: No rash, tenderness or lesion.        Left: No rash, tenderness or lesion.      Vagina: Normal. No tenderness.     Cervix: No cervical motion tenderness.     Uterus: Normal. Not enlarged and not tender.      Adnexa: Right adnexa normal and left adnexa normal.       Right: No mass or tenderness.         Left: No mass or tenderness.    Musculoskeletal: Normal range of motion.  Neurological:     Mental Status: She is alert and oriented to person, place, and time.  Psychiatric:        Behavior: Behavior normal.        Thought  Content: Thought content normal.     Results: Results for orders placed or performed in visit on 10/15/18 (from the past 24 hour(s))  POCT urine pregnancy     Status: Normal   Collection Time: 10/15/18 11:38 AM  Result Value Ref Range   Preg Test, Ur Negative Negative   ULTRASOUND REPORT  Location:  [facility]  Date of Service: 10/15/2018   Patient Name: MICAILA WEINSTOCK DOB: 1991/08/14 MRN: [id number]   Indications:Right lower quadrant pain Findings:  The uterus is anteverted and measures 7.05 x 3.90 x 2.64 cm. Echo texture is homogenous without evidence of focal masses.  The Endometrium measures 6.26 mm.  Right Ovary measures 3.74 x 3.50 x 2.25 cm. It is normal in appearance. Left Ovary measures 3.03 x 2.37 x 1.16 cm. It is normal in appearance. Survey of the adnexa demonstrates no adnexal masses. There is no free fluid in the cul de sac.  Impression: 1. No abnormalities are seen  Recommendations: 1.Clinical correlation with the patient's History and Physical Exam.  Mital bahen P Patel, RDMS  Assessment/Plan: RLQ abdominal pain - Neg UPT, neg GYN u/s. Question MSK vs GI. Discussed signs/sx of appendicitis. F/u with PCP if sx persist.  - Plan: US PELVIS TRANSVANGINAL NON-OB (TV ONLY), POCT urine pregnancy    Return if symptoms worsen or fail to improve.   B. , PA-C 10/15/2018 12:11 PM

## 2018-10-15 NOTE — Patient Instructions (Signed)
I value your feedback and entrusting us with your care. If you get a [facility] patient survey, I would appreciate you taking the time to let us know about your experience today. Thank you! 

## 2018-10-19 ENCOUNTER — Encounter: Payer: Self-pay | Admitting: Nurse Practitioner

## 2018-10-19 ENCOUNTER — Ambulatory Visit: Payer: BC Managed Care – PPO | Admitting: Nurse Practitioner

## 2018-10-19 VITALS — BP 122/80 | HR 90 | Resp 16 | Ht 63.0 in | Wt 212.0 lb

## 2018-10-19 DIAGNOSIS — R1084 Generalized abdominal pain: Secondary | ICD-10-CM

## 2018-10-19 DIAGNOSIS — E668 Other obesity: Secondary | ICD-10-CM | POA: Diagnosis not present

## 2018-10-19 DIAGNOSIS — K5909 Other constipation: Secondary | ICD-10-CM

## 2018-10-19 HISTORY — DX: Other constipation: K59.09

## 2018-10-19 HISTORY — DX: Generalized abdominal pain: R10.84

## 2018-10-19 MED ORDER — PHENTERMINE HCL 37.5 MG PO TABS: 37.5000 mg | ORAL_TABLET | Freq: Every day | ORAL | 1 refills | Status: DC

## 2018-10-19 MED ORDER — DOCUSATE SODIUM 100 MG PO CAPS: 100.0000 mg | ORAL_CAPSULE | Freq: Every day | ORAL | 5 refills | Status: DC

## 2018-10-19 NOTE — Progress Notes (Signed)
[facility] [address] [place], [place] [address]  Internal MEDICINE  Office Visit Note  Patient Name: Hannah Stevens  [id number]  [id number]  Date of Service: 10/19/2018  Chief Complaint  Patient presents with  . Medical Management of Chronic Issues    6wk weight management  . Pain    pt went to OBGYN, groin pains that comes and goes, feels like a tingly sensation feels like a muscle spasm then goes to a pain that feels like something stabbing her,  wakes her up out of her sleep and pain unbearable, felt another flutter wednesday, happened monday, and over the weekend and again on thursday.     Patient is here for follow up of weight management. She is currently taking phentermine. Has lost 4 pounds since she was last seen. Is exercising frequently and is carefully watching her calorie intake. She has no negative side effects from taking this medication.  She has seen GYN provider due to intermittent, stabbing pains in the right groin. When this happens, the sensation can feel like muscle twitching or can feel as bad as being stabbed in the groin with knives and needles in her groin area. Ultrasound was done and there was nothing abnormal noted. No pregnancy or ectopic pregnacy. She was told to see her primary care provider if these pains persisted. She states that there is no diarrhea associated with this. Denies nausea or vomiting.       Current Medication: Outpatient Encounter Medications as of 10/19/2018  Medication Sig  . etonogestrel-ethinyl estradiol (NUVARING) 0.12-0.015 MG/24HR vaginal ring INSERT VAGINALLY AND LEAVE IN PLACE FOR 3 CONSECUTIVE WEEKS, THEN REMOVE FOR 1 WEEK  . phentermine (ADIPEX-P) 37.5 MG tablet Take 1 tablet (37.5 mg total) by mouth daily before breakfast.  . [DISCONTINUED] Docusate Sodium (STOOL SOFTENER LAXATIVE PO) Take by mouth.  . [DISCONTINUED] phentermine (ADIPEX-P) 37.5 MG tablet Take 1 tablet (37.5 mg total) by mouth daily before  breakfast.  . docusate sodium (STOOL SOFTENER LAXATIVE) 100 MG capsule Take 1 capsule (100 mg total) by mouth daily.  . fluticasone (FLONASE) 50 MCG/ACT nasal spray Place 2 sprays into both nostrils daily. (Patient not taking: Reported on 10/15/2018)   No facility-administered encounter medications on file as of 10/19/2018.     Surgical History: Past Surgical History:  Procedure Laterality Date  . TONSILLECTOMY    . WISDOM TOOTH EXTRACTION      Medical History: Past Medical History:  Diagnosis Date  . Anxiety   . Ovarian cyst   . Pelvic pain   . Vaccine for human papilloma virus (HPV) types 6, 11, 16, and 18 administered     Family History: Family History  Problem Relation Age of Onset  . Diabetes Father     Social History   Socioeconomic History  . Marital status: Single    Spouse name: Not on file  . Number of children: Not on file  . Years of education: Not on file  . Highest education level: Not on file  Occupational History  . Not on file  Social Needs  . Financial resource strain: Not on file  . Food insecurity:    Worry: Not on file    Inability: Not on file  . Transportation needs:    Medical: Not on file    Non-medical: Not on file  Tobacco Use  . Smoking status: Former Smoker    Years: 1.00    Last attempt to quit: 10/14/2014    Years since  quitting: 4.0  . Smokeless tobacco: Never Used  Substance and Sexual Activity  . Alcohol use: Yes    Alcohol/week: 0.0 standard drinks  . Drug use: No  . Sexual activity: Yes    Birth control/protection: Inserts  Lifestyle  . Physical activity:    Days per week: Not on file    Minutes per session: Not on file  . Stress: Not on file  Relationships  . Social connections:    Talks on phone: Not on file    Gets together: Not on file    Attends religious service: Not on file    Active member of club or organization: Not on file    Attends meetings of clubs or organizations: Not on file    Relationship status:  Not on file  . Intimate partner violence:    Fear of current or ex partner: Not on file    Emotionally abused: Not on file    Physically abused: Not on file    Forced sexual activity: Not on file  Other Topics Concern  . Not on file  Social History Narrative  . Not on file      Review of Systems  Constitutional: Negative for fatigue and fever.       Four pound weight loss since her last visit.   HENT: Negative for congestion, ear pain, facial swelling, rhinorrhea, sinus pain and sore throat.   Respiratory: Negative for apnea, cough, chest tightness, shortness of breath and wheezing.   Cardiovascular: Negative for chest pain, palpitations and leg swelling.  Gastrointestinal: Positive for abdominal pain and constipation. Negative for abdominal distention, nausea and vomiting.       Intermittent, sharp pain in the right lower quadrant of the abdomen. Happened three times last week and few times over the weekend. Did see her GYN provider last Friday and transvaginal ultrasound was performed and no acute abnormalities were noted.   Endocrine: Negative for cold intolerance, heat intolerance, polydipsia, polyphagia and polyuria.  Genitourinary: Positive for pelvic pain.  Musculoskeletal: Negative for arthralgias.  Skin: Negative for color change, rash and wound.  Allergic/Immunologic: Negative for environmental allergies.  Neurological: Negative for dizziness, tremors, facial asymmetry, weakness, light-headedness and numbness.  Hematological: Negative for adenopathy.    Today's Vitals   10/19/18 0935  BP: 122/80  Pulse: 90  Resp: 16  SpO2: 99%  Weight: 212 lb (96.2 kg)  Height: 5\' 3"  (1.6 m)  Body mass index is 37.55 kg/m.  Physical Exam Vitals signs and nursing note reviewed.  Constitutional:      Appearance: She is well-developed. She is obese.  HENT:     Head: Normocephalic and atraumatic.  Eyes:     Pupils: Pupils are equal, round, and reactive to light.  Neck:      Musculoskeletal: Normal range of motion and neck supple.  Cardiovascular:     Rate and Rhythm: Normal rate and regular rhythm.     Heart sounds: Normal heart sounds. No murmur. No friction rub. No gallop.   Pulmonary:     Effort: Pulmonary effort is normal. No respiratory distress.     Breath sounds: Normal breath sounds. No wheezing or rales.  Chest:     Chest wall: No tenderness.  Abdominal:     General: Bowel sounds are normal. There is no distension.     Palpations: Abdomen is soft.     Tenderness: There is no abdominal tenderness. There is no guarding.     Comments: Currently no pain or  tenderness with palpation of the abdomen.   Musculoskeletal: Normal range of motion.        General: No tenderness or deformity.  Skin:    General: Skin is warm and dry.  Neurological:     Mental Status: She is alert and oriented to person, place, and time.  Psychiatric:        Behavior: Behavior normal.        Thought Content: Thought content normal.        Judgment: Judgment normal.    Assessment/Plan: 1. Generalized abdominal pain Negative transvaginal ultrasound done 10/16/2018 per GYN provider. Will get CT abdomen and pelvis with oral and IV contrast for further evaluation.  - CT Abdomen Pelvis W Contrast; Future  2. Moderate obesity Improving. Continue phentermine daily. Continue regular exercise program with trainer and keep calories to 1500 calories per day.  - phentermine (ADIPEX-P) 37.5 MG tablet; Take 1 tablet (37.5 mg total) by mouth daily before breakfast.  Dispense: 30 tablet; Refill: 1  3. Other constipation - docusate sodium (STOOL SOFTENER LAXATIVE) 100 MG capsule; Take 1 capsule (100 mg total) by mouth daily.  Dispense: 30 capsule; Refill: 5  General Counseling: Seham verbalizes understanding of the findings of todays visit and agrees with plan of treatment. I have discussed any further diagnostic evaluation that may be needed or ordered today. We also reviewed her medications  today. she has been encouraged to call the office with any questions or concerns that should arise related to todays visit.   There is a liability release in patients' chart. There has been a 10 minute discussion about the side effects including but not limited to elevated blood pressure, anxiety, lack of sleep and dry mouth. Pt understands and will like to start/continue on appetite suppressant at this time. There will be one month RX given at the time of visit with proper follow up. Nova diet plan with restricted calories is given to the pt. Pt understands and agrees with  plan of treatment  This patient was seen by Vincent GrosHeather Wardell Pokorski FNP Collaboration with Dr Lyndon CodeFozia M Khan as a part of collaborative care agreement  Orders Placed This Encounter  Procedures  . CT Abdomen Pelvis W Contrast    Meds ordered this encounter  Medications  . docusate sodium (STOOL SOFTENER LAXATIVE) 100 MG capsule    Sig: Take 1 capsule (100 mg total) by mouth daily.    Dispense:  30 capsule    Refill:  5    Order Specific Question:   Supervising Provider    Answer:   Lyndon CodeKHAN, FOZIA M [[id number]]  . phentermine (ADIPEX-P) 37.5 MG tablet    Sig: Take 1 tablet (37.5 mg total) by mouth daily before breakfast.    Dispense:  30 tablet    Refill:  1    Order Specific Question:   Supervising Provider    Answer:   Lyndon CodeKHAN, FOZIA M [[id number]]    Time spent: 3730 Minutes      Dr Lyndon CodeFozia M Khan Internal medicine

## 2018-10-22 ENCOUNTER — Ambulatory Visit: Admission: RE | Admit: 2018-10-22 | Payer: BLUE CROSS/BLUE SHIELD | Source: Ambulatory Visit

## 2018-10-26 ENCOUNTER — Telehealth: Payer: Self-pay

## 2018-10-26 NOTE — Telephone Encounter (Signed)
Unfortunately, we don't have ability to do abd u/s (only pelvic which was neg). Can check with imaging centers in [place] re: availability.

## 2018-10-26 NOTE — Telephone Encounter (Signed)
Pt's pain has continued; saw her PC who wanted to order a CT scan; Ins won't pay for CT scan until after an abdominal u/s is done.  It is going to be over a week before u/s can be done.  Wants to know if abdominal u/s can be done here?  [phone]

## 2018-10-26 NOTE — Telephone Encounter (Signed)
Pt aware.

## 2018-10-26 NOTE — Telephone Encounter (Signed)
Please advise 

## 2018-10-30 DIAGNOSIS — R109 Unspecified abdominal pain: Secondary | ICD-10-CM | POA: Diagnosis not present

## 2018-11-02 ENCOUNTER — Encounter: Payer: Self-pay | Admitting: Nurse Practitioner

## 2018-11-02 ENCOUNTER — Other Ambulatory Visit: Payer: Self-pay

## 2018-11-04 ENCOUNTER — Encounter: Payer: Self-pay | Admitting: Gastroenterology

## 2018-11-04 ENCOUNTER — Ambulatory Visit: Payer: BLUE CROSS/BLUE SHIELD | Admitting: Gastroenterology

## 2018-11-04 VITALS — BP 111/72 | HR 86 | Ht 63.0 in | Wt 218.4 lb

## 2018-11-04 DIAGNOSIS — R1031 Right lower quadrant pain: Secondary | ICD-10-CM

## 2018-11-04 NOTE — Progress Notes (Signed)
Hannah BouillonVarnita Graison Leinberger [address]  [address]  [place], [place] [address]  Main: [phone]  Fax: [phone]   Gastroenterology Consultation  Referring Provider:     Carlean JewsBoscia, Heather E, NP Primary Care Physician:  Carlean JewsBoscia, Heather E, NP Reason for Consultation:    Abdominal pain        HPI:    Chief Complaint  Patient presents with  . New Patient (Initial Visit)    referral from B. Roland, NP for Abdominal pain. Pt states pain is above her right inner groin area for 3 weeks.    Leslie DalesLeah M Stallings is a 28 y.o. y/o female referred for consultation & management  by Dr. Carlean JewsBoscia, Heather E, NP.  Patient was referred to me for abdominal pain, but patient specifically denies any abdominal pain whatsoever.  States her pain is in her right suprapubic to right groin region.  This started in December 2019.  Describes it as sharp 10/10 pain, that lasts for 30 to 40 seconds and then completely resolves.  No radiation.  Will occur out of nowhere.  States the pain is so severe she has to stop what she is doing and sometimes sit on the floor until the pain passes.  No alleviating factors.  Pain has been occurring more frequently, almost on a daily basis for the last 1 to 2 weeks.  Reports soft bowel movements daily.  No constipation or blood in stool.  No weight loss.  No heartburn or dysphagia.  No nausea or vomiting.  The lifestyle changes that she has made recently was in November 2019 she has started working out with a Systems analystpersonal trainer and her exercise regimen includes squats, cardio workout and upper body workout.  She has had extensive work-up with GYN and her primary care provider included transvaginal, pelvic ultrasound and CT abdomen.  None of these have shown any abnormalities to attribute to her pain.  Past Medical History:  Diagnosis Date  . Anxiety   . Ovarian cyst   . Pelvic pain   . Vaccine for human papilloma virus (HPV) types 6, 11, 16, and 18 administered     Past Surgical  History:  Procedure Laterality Date  . TONSILLECTOMY    . WISDOM TOOTH EXTRACTION      Prior to Admission medications   Medication Sig Start Date End Date Taking? Authorizing Provider  docusate sodium (STOOL SOFTENER LAXATIVE) 100 MG capsule Take 1 capsule (100 mg total) by mouth daily. 10/19/18  Yes Carlean JewsBoscia, Heather E, NP  etonogestrel-ethinyl estradiol (NUVARING) 0.12-0.015 MG/24HR vaginal ring INSERT VAGINALLY AND LEAVE IN PLACE FOR 3 CONSECUTIVE WEEKS, THEN REMOVE FOR 1 WEEK 06/29/18  Yes Copland, Alicia B, PA-C  phentermine (ADIPEX-P) 37.5 MG tablet Take 1 tablet (37.5 mg total) by mouth daily before breakfast. 10/19/18  Yes Boscia, Heather E, NP  fluticasone (FLONASE) 50 MCG/ACT nasal spray Place 2 sprays into both nostrils daily. Patient not taking: Reported on 10/15/2018 10/05/18   Johnna AcostaScarboro, Adam J, NP    Family History  Problem Relation Age of Onset  . Diabetes Father      Social History   Tobacco Use  . Smoking status: Former Smoker    Years: 1.00    Last attempt to quit: 10/14/2014    Years since quitting: 4.0  . Smokeless tobacco: Never Used  Substance Use Topics  . Alcohol use: Yes    Alcohol/week: 0.0 standard drinks  . Drug use: No    Allergies as of 11/04/2018  . (  No Known Allergies)    Review of Systems:    All systems reviewed and negative except where noted in HPI.   Physical Exam:  BP 111/72   Pulse 86   Ht 5\' 3"  (1.6 m)   Wt 218 lb 6.4 oz (99.1 kg)   LMP 10/10/2018 (Exact Date)   BMI 38.69 kg/m  Patient's last menstrual period was 10/10/2018 (exact date). Psych:  Alert and cooperative. Normal mood and affect. General:   Alert,  Well-developed, well-nourished, pleasant and cooperative in NAD Head:  Normocephalic and atraumatic. Eyes:  Sclera clear, no icterus.   Conjunctiva pink. Ears:  Normal auditory acuity. Nose:  No deformity, discharge, or lesions. Mouth:  No deformity or lesions,oropharynx pink & moist. Neck:  Supple; no masses or  thyromegaly. Abdomen:  Normal bowel sounds.  No bruits.  Soft, non-tender and non-distended without masses, hepatosplenomegaly or hernias noted.  No guarding or rebound tenderness.    Genitourinary: No inguinal hernias noted both on laying down, standing up, and standing up with coughing Msk:  Symmetrical without gross deformities. Good, equal movement & strength bilaterally. Pulses:  Normal pulses noted. Extremities:  No clubbing or edema.  No cyanosis. Neurologic:  Alert and oriented x3;  grossly normal neurologically. Skin:  Intact without significant lesions or rashes. No jaundice. Lymph Nodes:  No significant cervical adenopathy. Psych:  Alert and cooperative. Normal mood and affect.   Labs: CBC    Component Value Date/Time   WBC 5.5 03/13/2018 0748   RBC 4.45 03/13/2018 0748   HGB 13.4 03/13/2018 0748   HCT 38.7 03/13/2018 0748   PLT 273 03/13/2018 0748   MCV 87 03/13/2018 0748   MCH 30.1 03/13/2018 0748   MCHC 34.6 03/13/2018 0748   RDW 12.4 03/13/2018 0748   LYMPHSABS 2.1 03/13/2018 0748   EOSABS 0.0 03/13/2018 0748   BASOSABS 0.0 03/13/2018 0748   CMP     Component Value Date/Time   NA 141 03/13/2018 0748   K 4.5 03/13/2018 0748   CL 104 03/13/2018 0748   CO2 22 03/13/2018 0748   GLUCOSE 86 03/13/2018 0748   BUN 14 03/13/2018 0748   CREATININE 0.82 03/13/2018 0748   CALCIUM 9.6 03/13/2018 0748   PROT 6.8 03/13/2018 0748   ALBUMIN 4.3 03/13/2018 0748   AST 17 03/13/2018 0748   ALT 16 03/13/2018 0748   ALKPHOS 71 03/13/2018 0748   BILITOT 0.4 03/13/2018 0748   GFRNONAA 99 03/13/2018 0748   GFRAA 114 03/13/2018 0748    Imaging Studies: [place] Pelvis Transvanginal Non-ob (tv Only)  Result Date: 10/19/2018 ULTRASOUND REPORT Location: [facility] Date of Service: 10/15/2018 Patient Name: Leslie DalesLeah M Stallings DOB: 06/25/1991 MRN: [id number] Indications:Right lower quadrant pain Findings: The uterus is anteverted and measures 7.05 x 3.90 x 2.64 cm. Echo texture is  homogenous without evidence of focal masses. The Endometrium measures 6.26 mm. Right Ovary measures 3.74 x 3.50 x 2.25 cm. It is normal in appearance. Left Ovary measures 3.03 x 2.37 x 1.16 cm. It is normal in appearance. Survey of the adnexa demonstrates no adnexal masses. There is no free fluid in the cul de sac. Impression: 1. No abnormalities are seen Recommendations: 1.Clinical correlation with the patient's History and Physical Exam. Mital bahen P Patel, RDMS I have reviewed this ultrasound and the report. I agree with the above assessment and plan. Adelene Idlerhristanna Schuman MD [facility] 10/19/18 6:07 PM     Assessment and Plan:   Leslie DalesLeah M Stallings  is a 28 y.o. y/o female has been referred for abdominal pain, but denies any abdominal pain and rather has right groin pain  Her symptoms are most consistent with musculoskeletal pain or muscle spasms She started an exercise regimen with a personal trainer November 2019 and these fleeting episodes of pain started a month after that  Her abdominal exam, imaging and clinical history are not consistent with abdominal etiology of pain.  She should follow-up with her primary care physician and they can consider a muscle relaxer since her pain has now been occurring daily to see if it helps.  In the meantime patient was asked to try and avoid lower body workout for 2 to 3 weeks to see if it helps with her pain.  I reassured her that this is not from her abdomen, or related to her pancreas or diverticulitis.  Other sources of pain in this area can include inguinal hernia, although I do not see one on her clinical exam today.  PCP can consider surgery referral if needed.  Patient also use hot or cold packs as needed for muscle spasms.  Dr Hannah Stevens  Speech recognition software was used to dictate the above note.

## 2018-11-06 ENCOUNTER — Encounter: Payer: Self-pay | Admitting: Nurse Practitioner

## 2018-11-06 ENCOUNTER — Ambulatory Visit: Payer: BC Managed Care – PPO | Admitting: Nurse Practitioner

## 2018-11-06 VITALS — BP 130/80 | HR 78 | Resp 16 | Ht 63.0 in | Wt 210.0 lb

## 2018-11-06 DIAGNOSIS — R1084 Generalized abdominal pain: Secondary | ICD-10-CM | POA: Diagnosis not present

## 2018-11-06 DIAGNOSIS — R1031 Right lower quadrant pain: Secondary | ICD-10-CM | POA: Diagnosis not present

## 2018-11-06 MED ORDER — METHYLPREDNISOLONE 4 MG PO TBPK: ORAL_TABLET | ORAL | 0 refills | Status: DC

## 2018-11-06 NOTE — Progress Notes (Signed)
[facility] [address] [place], [place] [address]  Internal MEDICINE  Office Visit Note  Patient Name: Hannah Stevens  [id number]  [id number]  Date of Service: 11/15/2018   Pt is here for a sick visit.  Chief Complaint  Patient presents with  . Groin Pain    going on  for few days     Continues to have stabbing pains in the right groin. When this happens, the sensation can feel like muscle twitching or can feel as bad as being stabbed in the groin with knives and needles in her groin area. Ultrasound was done and there was nothing abnormal noted. No pregnancy or ectopic pregnacy. She has had CT of abdomen and pelvis with contrast since she was last here, this was negative for abnormalities in the abdomen or pelvis. She saw GI and was basically told that there were no issues related to GI and she should follow up with her primary care provider for further evaluation and treatment.        Current Medication:  Outpatient Encounter Medications as of 11/06/2018  Medication Sig  . docusate sodium (STOOL SOFTENER LAXATIVE) 100 MG capsule Take 1 capsule (100 mg total) by mouth daily.  Marland Kitchen etonogestrel-ethinyl estradiol (NUVARING) 0.12-0.015 MG/24HR vaginal ring INSERT VAGINALLY AND LEAVE IN PLACE FOR 3 CONSECUTIVE WEEKS, THEN REMOVE FOR 1 WEEK  . fluticasone (FLONASE) 50 MCG/ACT nasal spray Place 2 sprays into both nostrils daily.  . phentermine (ADIPEX-P) 37.5 MG tablet Take 1 tablet (37.5 mg total) by mouth daily before breakfast.  . methylPREDNISolone (MEDROL) 4 MG TBPK tablet Take by mouth as directed for 6 days   No facility-administered encounter medications on file as of 11/06/2018.       Medical History: Past Medical History:  Diagnosis Date  . Anxiety   . Ovarian cyst   . Pelvic pain   . Vaccine for human papilloma virus (HPV) types 6, 11, 16, and 18 administered      Today's Vitals   11/06/18 1205  BP: 130/80  Pulse: 78  Resp: 16  SpO2: 98%   Weight: 210 lb (95.3 kg)  Height: 5\' 3"  (1.6 m)    Review of Systems  Constitutional: Negative for fatigue and fever.       Four pound weight loss since her last visit.   HENT: Negative for congestion, ear pain, facial swelling, rhinorrhea, sinus pain and sore throat.   Respiratory: Negative for apnea, cough, chest tightness, shortness of breath and wheezing.   Cardiovascular: Negative for chest pain and palpitations.  Gastrointestinal: Positive for abdominal pain and constipation. Negative for abdominal distention, nausea and vomiting.       Intermittent, sharp pain in the right lower quadrant of the abdomen. Happened three times last week and few times over the weekend. Did see her GYN provider last Friday and transvaginal ultrasound was performed and no acute abnormalities were noted.   Endocrine: Negative for cold intolerance, heat intolerance, polydipsia and polyuria.  Genitourinary: Positive for pelvic pain.  Musculoskeletal: Positive for arthralgias.       Right groin pain.  Skin: Negative for color change, rash and wound.  Allergic/Immunologic: Negative for environmental allergies.  Neurological: Negative for dizziness, tremors, facial asymmetry, weakness, light-headedness and numbness.  Hematological: Negative for adenopathy.    Physical Exam Vitals signs and nursing note reviewed.  Constitutional:      Appearance: She is well-developed. She is obese.  HENT:     Head: Normocephalic and atraumatic.  Neck:     Musculoskeletal: Normal range of motion and neck supple.  Cardiovascular:     Rate and Rhythm: Normal rate and regular rhythm.     Heart sounds: Normal heart sounds. No murmur. No friction rub. No gallop.   Pulmonary:     Effort: Pulmonary effort is normal. No respiratory distress.     Breath sounds: Normal breath sounds. No wheezing or rales.  Chest:     Chest wall: No tenderness.  Abdominal:     General: Bowel sounds are normal. There is no distension.      Palpations: Abdomen is soft.     Tenderness: There is no abdominal tenderness. There is no guarding.     Comments: Currently no pain or tenderness with palpation of the abdomen.   Musculoskeletal: Normal range of motion.        General: No tenderness or deformity.  Skin:    General: Skin is warm and dry.  Neurological:     Mental Status: She is alert and oriented to person, place, and time.  Psychiatric:        Behavior: Behavior normal.        Thought Content: Thought content normal.        Judgment: Judgment normal.    Assessment/Plan: 1. Severe right groin pain Unclear etiology. Add medrol dose pack. Take as directed for 6 days. Will get x-ray of right hip for further evaluation and treatment. Consider MRI if negative x-ray. - DG HIP UNILAT WITH PELVIS 2-3 VIEWS RIGHT; Future - methylPREDNISolone (MEDROL) 4 MG TBPK tablet; Take by mouth as directed for 6 days  Dispense: 21 tablet; Refill: 0  2. Generalized abdominal pain Reviewed CT scan of the abdomen which showed no acute abnormalities. GI consultation negative.   General Counseling: Hannah Stevens verbalizes understanding of the findings of todays visit and agrees with plan of treatment. I have discussed any further diagnostic evaluation that may be needed or ordered today. We also reviewed her medications today. she has been encouraged to call the office with any questions or concerns that should arise related to todays visit.    Counseling:  This patient was seen by Vincent GrosHeather Quinlyn Tep FNP Collaboration with Dr Lyndon CodeFozia M Khan as a part of collaborative care agreement  Orders Placed This Encounter  Procedures  . DG HIP UNILAT WITH PELVIS 2-3 VIEWS RIGHT    Meds ordered this encounter  Medications  . methylPREDNISolone (MEDROL) 4 MG TBPK tablet    Sig: Take by mouth as directed for 6 days    Dispense:  21 tablet    Refill:  0    Order Specific Question:   Supervising Provider    Answer:   Lyndon CodeKHAN, FOZIA M [[id number]]    Time spent: 25  Minutes

## 2018-11-11 ENCOUNTER — Ambulatory Visit
Admission: RE | Admit: 2018-11-11 | Discharge: 2018-11-11 | Disposition: A | Discharge status: Home / Self Care | Payer: BLUE CROSS/BLUE SHIELD | Source: Ambulatory Visit | Attending: Nurse Practitioner | Admitting: Nurse Practitioner

## 2018-11-11 ENCOUNTER — Ambulatory Visit
Admission: RE | Admit: 2018-11-11 | Discharge: 2018-11-11 | Disposition: A | Discharge status: Home / Self Care | Payer: BLUE CROSS/BLUE SHIELD | Attending: Nurse Practitioner | Admitting: Nurse Practitioner

## 2018-11-11 DIAGNOSIS — R1031 Right lower quadrant pain: Secondary | ICD-10-CM | POA: Insufficient documentation

## 2018-11-11 DIAGNOSIS — M25551 Pain in right hip: Secondary | ICD-10-CM | POA: Insufficient documentation

## 2018-11-11 DIAGNOSIS — R1030 Lower abdominal pain, unspecified: Secondary | ICD-10-CM | POA: Diagnosis not present

## 2018-11-15 DIAGNOSIS — R1031 Right lower quadrant pain: Secondary | ICD-10-CM | POA: Insufficient documentation

## 2018-11-16 ENCOUNTER — Ambulatory Visit: Payer: PRIVATE HEALTH INSURANCE | Admitting: Gastroenterology

## 2018-11-16 ENCOUNTER — Encounter: Payer: Self-pay | Admitting: Nurse Practitioner

## 2018-11-16 ENCOUNTER — Ambulatory Visit: Payer: BC Managed Care – PPO | Admitting: Nurse Practitioner

## 2018-11-16 VITALS — BP 122/80 | HR 94 | Resp 16 | Ht 63.0 in | Wt 209.0 lb

## 2018-11-16 DIAGNOSIS — R102 Pelvic and perineal pain: Secondary | ICD-10-CM | POA: Diagnosis not present

## 2018-11-16 DIAGNOSIS — N809 Endometriosis, unspecified: Secondary | ICD-10-CM

## 2018-11-16 DIAGNOSIS — R1031 Right lower quadrant pain: Secondary | ICD-10-CM

## 2018-11-16 DIAGNOSIS — E668 Other obesity: Secondary | ICD-10-CM

## 2018-11-16 HISTORY — DX: Endometriosis, unspecified: N80.9

## 2018-11-16 MED ORDER — PHENTERMINE HCL 37.5 MG PO TABS: 37.5000 mg | ORAL_TABLET | Freq: Every day | ORAL | 1 refills | Status: DC

## 2018-11-16 NOTE — Progress Notes (Signed)
[facility] [address] [place], [place] [address]  Internal MEDICINE  Office Visit Note  Patient Name: Hannah DalesLeah M Stallings  [birth year]  [id number]  Date of Service: 11/16/2018  Chief Complaint  Patient presents with  . Medical Management of Chronic Issues    4 wk follow up    Continues to have stabbing pains in the right groin. When this happens, the sensation can feel like muscle twitching or can feel as bad as being stabbed in the groin with knives and needles in her groin area. Ultrasound was done and there was nothing abnormal noted. No pregnancy or ectopic pregnacy. She has had CT of abdomen and pelvis with contrast since she was last here, this was negative for abnormalities in the abdomen or pelvis. She saw GI and was basically told that there were no issues related to GI and she should follow up with her primary care provider for further evaluation and treatment. She has just had x-ray of right hip and this was negative for any acute abnormalities or dislocation. She took medrol dose pack for six days. She did not notice any difference at all.  The patient continues to take phentermine every other day or so to help with weight loss. Since her last routine visit, she has lost 9 pounds. She has no negative side effects to report. Would like to continue with this, even though taking less often at this point.       Current Medication: Outpatient Encounter Medications as of 11/16/2018  Medication Sig  . docusate sodium (STOOL SOFTENER LAXATIVE) 100 MG capsule Take 1 capsule (100 mg total) by mouth daily.  Marland Kitchen. etonogestrel-ethinyl estradiol (NUVARING) 0.12-0.015 MG/24HR vaginal ring INSERT VAGINALLY AND LEAVE IN PLACE FOR 3 CONSECUTIVE WEEKS, THEN REMOVE FOR 1 WEEK  . fluticasone (FLONASE) 50 MCG/ACT nasal spray Place 2 sprays into both nostrils daily.  . methylPREDNISolone (MEDROL) 4 MG TBPK tablet Take by mouth as directed for 6 days  . phentermine (ADIPEX-P) 37.5 MG tablet  Take 1 tablet (37.5 mg total) by mouth daily before breakfast.  . [DISCONTINUED] phentermine (ADIPEX-P) 37.5 MG tablet Take 1 tablet (37.5 mg total) by mouth daily before breakfast.   No facility-administered encounter medications on file as of 11/16/2018.     Surgical History: Past Surgical History:  Procedure Laterality Date  . TONSILLECTOMY    . WISDOM TOOTH EXTRACTION      Medical History: Past Medical History:  Diagnosis Date  . Anxiety   . Ovarian cyst   . Pelvic pain   . Vaccine for human papilloma virus (HPV) types 6, 11, 16, and 18 administered     Family History: Family History  Problem Relation Age of Onset  . Diabetes Father     Social History   Socioeconomic History  . Marital status: Single    Spouse name: Not on file  . Number of children: Not on file  . Years of education: Not on file  . Highest education level: Not on file  Occupational History  . Not on file  Social Needs  . Financial resource strain: Not on file  . Food insecurity:    Worry: Not on file    Inability: Not on file  . Transportation needs:    Medical: Not on file    Non-medical: Not on file  Tobacco Use  . Smoking status: Former Smoker    Years: 1.00    Last attempt to quit: 10/14/2014    Years since quitting: 4.0  .  Smokeless tobacco: Never Used  Substance and Sexual Activity  . Alcohol use: Yes    Alcohol/week: 0.0 standard drinks  . Drug use: No  . Sexual activity: Yes    Birth control/protection: Inserts  Lifestyle  . Physical activity:    Days per week: Not on file    Minutes per session: Not on file  . Stress: Not on file  Relationships  . Social connections:    Talks on phone: Not on file    Gets together: Not on file    Attends religious service: Not on file    Active member of club or organization: Not on file    Attends meetings of clubs or organizations: Not on file    Relationship status: Not on file  . Intimate partner violence:    Fear of current or ex  partner: Not on file    Emotionally abused: Not on file    Physically abused: Not on file    Forced sexual activity: Not on file  Other Topics Concern  . Not on file  Social History Narrative  . Not on file      Review of Systems  Constitutional: Negative for fatigue and fever.       Nine pound weight loss since her last routine visit.   HENT: Negative for congestion, ear pain, facial swelling, rhinorrhea, sinus pain and sore throat.   Respiratory: Negative for apnea, cough, chest tightness, shortness of breath and wheezing.   Cardiovascular: Negative for chest pain and palpitations.  Gastrointestinal: Positive for abdominal pain and constipation. Negative for abdominal distention, nausea and vomiting.       Intermittent, sharp pain in the right lower quadrant of the abdomen. Happened three times last week and few times over the weekend. Did see her GYN provider last Friday and transvaginal ultrasound was performed and no acute abnormalities were noted.   Endocrine: Negative for cold intolerance, heat intolerance, polydipsia and polyuria.  Genitourinary: Positive for pelvic pain.  Musculoskeletal: Positive for arthralgias.       Right groin pain.  Skin: Negative for color change, rash and wound.  Allergic/Immunologic: Negative for environmental allergies.  Neurological: Negative for dizziness, tremors, facial asymmetry, weakness, light-headedness and numbness.  Hematological: Negative for adenopathy.    Today's Vitals   11/16/18 0930  BP: 122/80  Pulse: 94  Resp: 16  SpO2: 98%  Weight: 209 lb (94.8 kg)  Height: 5\' 3"  (1.6 m)   Body mass index is 37.02 kg/m.  Physical Exam Vitals signs and nursing note reviewed.  Constitutional:      General: She is in acute distress.     Appearance: She is well-developed. She is obese.  HENT:     Head: Normocephalic and atraumatic.  Neck:     Musculoskeletal: Normal range of motion and neck supple.  Cardiovascular:     Rate and  Rhythm: Normal rate and regular rhythm.     Heart sounds: Normal heart sounds. No murmur. No friction rub. No gallop.   Pulmonary:     Effort: Pulmonary effort is normal. No respiratory distress.     Breath sounds: Normal breath sounds. No wheezing or rales.  Chest:     Chest wall: No tenderness.  Abdominal:     General: Bowel sounds are normal. There is no distension.     Palpations: Abdomen is soft.     Tenderness: There is no abdominal tenderness. There is no guarding.     Comments: Currently no pain or tenderness  with palpation of the abdomen.   Musculoskeletal: Normal range of motion.        General: No tenderness or deformity.  Skin:    General: Skin is warm and dry.  Neurological:     Mental Status: She is alert and oriented to person, place, and time.  Psychiatric:        Behavior: Behavior normal.        Thought Content: Thought content normal.        Judgment: Judgment normal.    Assessment/Plan: 1. Severe right groin pain Right hip negative for abnormalities or deformities. Will get MRI of right hip and pelvis fur further evaluation. Labs ordered to check connective tissue panel, CBC, and vitamin d levels.  - MR HIP RIGHT W WO CONTRAST; Future  2. Pelvic and perineal pain Right hip negative for abnormalities or deformities. Will get MRI of right hip and pelvis fur further evaluation. Labs ordered to check connective tissue panel, CBC, and vitamin d levels.  - MR HIP RIGHT W WO CONTRAST; Future - MR Pelvis W Wo Contrast; Future  3. Endometriosis Consider this diagnosis in relationship to the severe right pelvic and hip pain.  - MR Pelvis W Wo Contrast; Future  4. Moderate obesity May continue phentermine as prescribed. Continue with low calorie diet and regular exercise  - phentermine (ADIPEX-P) 37.5 MG tablet; Take 1 tablet (37.5 mg total) by mouth daily before breakfast.  Dispense: 30 tablet; Refill: 1  General Counseling: Unice verbalizes understanding of the  findings of todays visit and agrees with plan of treatment. I have discussed any further diagnostic evaluation that may be needed or ordered today. We also reviewed her medications today. she has been encouraged to call the office with any questions or concerns that should arise related to todays visit.   There is a liability release in patients' chart. There has been a 10 minute discussion about the side effects including but not limited to elevated blood pressure, anxiety, lack of sleep and dry mouth. Pt understands and will like to start/continue on appetite suppressant at this time. There will be one month RX given at the time of visit with proper follow up. Nova diet plan with restricted calories is given to the pt. Pt understands and agrees with  plan of treatment  This patient was seen by Vincent Gros FNP Collaboration with Dr Lyndon Code as a part of collaborative care agreement  Orders Placed This Encounter  Procedures  . MR HIP RIGHT W WO CONTRAST  . MR Pelvis W Wo Contrast    Meds ordered this encounter  Medications  . phentermine (ADIPEX-P) 37.5 MG tablet    Sig: Take 1 tablet (37.5 mg total) by mouth daily before breakfast.    Dispense:  30 tablet    Refill:  1    Order Specific Question:   Supervising Provider    Answer:   Lyndon Code [[id number]]    Time spent: 92 Minutes      Dr Lyndon Code Internal medicine

## 2018-11-17 ENCOUNTER — Other Ambulatory Visit: Payer: Self-pay | Admitting: Nurse Practitioner

## 2018-11-17 DIAGNOSIS — R1010 Upper abdominal pain, unspecified: Secondary | ICD-10-CM | POA: Diagnosis not present

## 2018-11-17 DIAGNOSIS — M064 Inflammatory polyarthropathy: Secondary | ICD-10-CM | POA: Diagnosis not present

## 2018-11-17 DIAGNOSIS — N809 Endometriosis, unspecified: Secondary | ICD-10-CM | POA: Diagnosis not present

## 2018-11-17 DIAGNOSIS — R1030 Lower abdominal pain, unspecified: Secondary | ICD-10-CM | POA: Diagnosis not present

## 2018-11-18 LAB — ANA W/REFLEX: ANA: NEGATIVE

## 2018-11-18 LAB — BASIC METABOLIC PANEL
BUN/Creatinine Ratio: 7 — ABNORMAL LOW (ref 9–23)
BUN: 7 mg/dL (ref 6–20)
CO2: 21 mmol/L (ref 20–29)
Calcium: 9.4 mg/dL (ref 8.7–10.2)
Chloride: 101 mmol/L (ref 96–106)
Creatinine, Ser: 0.96 mg/dL (ref 0.57–1.00)
GFR calc Af Amer: 94 mL/min/{1.73_m2} (ref >59)
GFR calc non Af Amer: 81 mL/min/{1.73_m2} (ref >59)
GLUCOSE: 87 mg/dL (ref 65–99)
Potassium: 3.9 mmol/L (ref 3.5–5.2)
Sodium: 138 mmol/L (ref 134–144)

## 2018-11-18 LAB — CBC
Hematocrit: 40.4 % (ref 34.0–46.6)
Hemoglobin: 13.5 g/dL (ref 11.1–15.9)
MCH: 29.5 pg (ref 26.6–33.0)
MCHC: 33.4 g/dL (ref 31.5–35.7)
MCV: 88 fL (ref 79–97)
Platelets: 281 10*3/uL (ref 150–450)
RBC: 4.57 x10E6/uL (ref 3.77–5.28)
RDW: 12.4 % (ref 11.7–15.4)
WBC: 5.7 10*3/uL (ref 3.4–10.8)

## 2018-11-18 LAB — VITAMIN D 25 HYDROXY (VIT D DEFICIENCY, FRACTURES): VIT D 25 HYDROXY: 35.8 ng/mL (ref 30.0–100.0)

## 2018-11-18 LAB — PREGNANCY, URINE: Preg Test, Ur: NEGATIVE

## 2018-11-18 LAB — RHEUMATOID FACTOR: Rheumatoid fact SerPl-aCnc: <10 IU/mL (ref 0.0–13.9)

## 2018-11-18 LAB — SEDIMENTATION RATE: Sed Rate: 6 mm/hr (ref 0–32)

## 2018-11-18 LAB — URIC ACID: Uric Acid: 4.7 mg/dL (ref 2.5–7.1)

## 2018-11-24 DIAGNOSIS — R1030 Lower abdominal pain, unspecified: Secondary | ICD-10-CM | POA: Diagnosis not present

## 2018-11-24 DIAGNOSIS — N809 Endometriosis, unspecified: Secondary | ICD-10-CM | POA: Diagnosis not present

## 2018-11-28 ENCOUNTER — Ambulatory Visit (HOSPITAL_COMMUNITY): Payer: BLUE CROSS/BLUE SHIELD

## 2018-12-07 ENCOUNTER — Ambulatory Visit: Payer: BLUE CROSS/BLUE SHIELD | Admitting: Obstetrics and Gynecology

## 2018-12-07 ENCOUNTER — Encounter: Payer: Self-pay | Admitting: Obstetrics and Gynecology

## 2018-12-07 VITALS — BP 148/80 | HR 111 | Ht 63.0 in | Wt 207.0 lb

## 2018-12-07 DIAGNOSIS — R1031 Right lower quadrant pain: Secondary | ICD-10-CM

## 2018-12-07 NOTE — Progress Notes (Signed)
Carlean Jews, NP   Chief Complaint  Patient presents with  . Pelvic Pain    wants to know if its endometriosis, right pelvic side, same as last visit    HPI:      [name] is a 28 y.o. G0P0000 who LMP was Patient's last menstrual period was 12/04/2018 (exact date)., presents today for RLQ pain f/u. Pt was seen by me 1/20 for , infrequent episodic, sharp pains in RT groin area. Sx lasts about 15-30 sec, increasing to about 60 sec at times now. No pain sx once pain resolves. Pain happens at night, while moving, bending over, standing. No known triggers. Pt had neg GYN u/s 1/20. Then had neg abd and pelvic CT. Had neg GI eval per PCP. Just had neg pelvic and hip MRIs (at [facility]--can't see images on epic). PCP referred pt to [facility] ortho but pt is still waiting for appt. She is very frustrated because sx increasing in frequency recently, about 15-20 times daily. Pt has cut back on lower body exercises in case a trigger. Pt's PCP questions if endometriosis is etiology.  Hx of ovar cysts in past, on nuvaring with monthly menses.   Past Medical History:  Diagnosis Date  . Anxiety   . Ovarian cyst   . Pelvic pain   . Vaccine for human papilloma virus (HPV) types 6, 11, 16, and 18 administered     Past Surgical History:  Procedure Laterality Date  . TONSILLECTOMY    . WISDOM TOOTH EXTRACTION      Family History  Problem Relation Age of Onset  . Diabetes Father     Social History   Socioeconomic History  . Marital status: Single    Spouse name: Not on file  . Number of children: Not on file  . Years of education: Not on file  . Highest education level: Not on file  Occupational History  . Not on file  Social Needs  . Financial resource strain: Not on file  . Food insecurity:    Worry: Not on file    Inability: Not on file  . Transportation needs:    Medical: Not on file    Non-medical: Not on file  Tobacco Use  . Smoking status: Former Smoker   Years: 1.00    Last attempt to quit: 10/14/2014    Years since quitting: 4.1  . Smokeless tobacco: Never Used  Substance and Sexual Activity  . Alcohol use: Yes    Alcohol/week: 0.0 standard drinks  . Drug use: No  . Sexual activity: Yes    Birth control/protection: Inserts    Comment: Nuvaring  Lifestyle  . Physical activity:    Days per week: Not on file    Minutes per session: Not on file  . Stress: Not on file  Relationships  . Social connections:    Talks on phone: Not on file    Gets together: Not on file    Attends religious service: Not on file    Active member of club or organization: Not on file    Attends meetings of clubs or organizations: Not on file    Relationship status: Not on file  . Intimate partner violence:    Fear of current or ex partner: Not on file    Emotionally abused: Not on file    Physically abused: Not on file    Forced sexual activity: Not on file  Other Topics Concern  . Not on  file  Social History Narrative  . Not on file    Outpatient Medications Prior to Visit  Medication Sig Dispense Refill  . docusate sodium (STOOL SOFTENER LAXATIVE) 100 MG capsule Take 1 capsule (100 mg total) by mouth daily. 30 capsule 5  . etonogestrel-ethinyl estradiol (NUVARING) 0.12-0.015 MG/24HR vaginal ring INSERT VAGINALLY AND LEAVE IN PLACE FOR 3 CONSECUTIVE WEEKS, THEN REMOVE FOR 1 WEEK 3 each 3  . phentermine (ADIPEX-P) 37.5 MG tablet Take 1 tablet (37.5 mg total) by mouth daily before breakfast. 30 tablet 1  . fluticasone (FLONASE) 50 MCG/ACT nasal spray Place 2 sprays into both nostrils daily. 16 g 6  . methylPREDNISolone (MEDROL) 4 MG TBPK tablet Take by mouth as directed for 6 days 21 tablet 0   No facility-administered medications prior to visit.       ROS:  Review of Systems  Constitutional: Negative for fatigue, fever and unexpected weight change.  Respiratory: Negative for cough, shortness of breath and wheezing.   Cardiovascular: Negative  for chest pain, palpitations and leg swelling.  Gastrointestinal: Negative for blood in stool, constipation, diarrhea, nausea and vomiting.  Endocrine: Negative for cold intolerance, heat intolerance and polyuria.  Genitourinary: Positive for pelvic pain. Negative for dyspareunia, dysuria, flank pain, frequency, genital sores, hematuria, menstrual problem, urgency, vaginal bleeding, vaginal discharge and vaginal pain.  Musculoskeletal: Negative for back pain, joint swelling and myalgias.  Skin: Negative for rash.  Neurological: Negative for dizziness, syncope, light-headedness, numbness and headaches.  Hematological: Negative for adenopathy.  Psychiatric/Behavioral: Negative for agitation, confusion, sleep disturbance and suicidal ideas. The patient is not nervous/anxious.    BREAST: No symptoms   OBJECTIVE:   Vitals:  BP (!) 148/80   Pulse (!) 111   Ht 5\' 3"  (1.6 m)   Wt 207 lb (93.9 kg)   LMP 12/04/2018 (Exact Date)   BMI 36.67 kg/m   Physical Exam Vitals signs reviewed.  Constitutional:      Appearance: She is well-developed.  Neck:     Musculoskeletal: Normal range of motion.  Pulmonary:     Effort: Pulmonary effort is normal.  Musculoskeletal: Normal range of motion.  Neurological:     Mental Status: She is alert and oriented to person, place, and time.     Cranial Nerves: No cranial nerve deficit.  Psychiatric:        Behavior: Behavior normal.        Thought Content: Thought content normal.        Judgment: Judgment normal.      Assessment/Plan: Right groin pain - Plan: AMB referral to orthopedics Neg imaging with PCP, per pt report. Question MSK/hip etiology. Discussed sx of endometriosis and pt's sx aren't usual. Suggest ortho eval first, then poss dx lap. Refer to Dr. Juanell Fairly. Pt to take MRI films with her to appt since we can't see them on epic.    Return if symptoms worsen or fail to improve.   B. , PA-C 12/08/2018 9:53  AM

## 2018-12-08 ENCOUNTER — Encounter: Payer: Self-pay | Admitting: Obstetrics and Gynecology

## 2018-12-08 NOTE — Patient Instructions (Signed)
I value your feedback and entrusting us with your care. If you get a [facility] patient survey, I would appreciate you taking the time to let us know about your experience today. Thank you! 

## 2018-12-21 DIAGNOSIS — M868X8 Other osteomyelitis, other site: Secondary | ICD-10-CM | POA: Diagnosis not present

## 2018-12-24 DIAGNOSIS — M25551 Pain in right hip: Secondary | ICD-10-CM | POA: Diagnosis not present

## 2018-12-24 DIAGNOSIS — M868X8 Other osteomyelitis, other site: Secondary | ICD-10-CM | POA: Diagnosis not present

## 2018-12-28 DIAGNOSIS — M25551 Pain in right hip: Secondary | ICD-10-CM | POA: Diagnosis not present

## 2018-12-28 DIAGNOSIS — M868X8 Other osteomyelitis, other site: Secondary | ICD-10-CM | POA: Diagnosis not present

## 2019-01-06 DIAGNOSIS — M25551 Pain in right hip: Secondary | ICD-10-CM | POA: Diagnosis not present

## 2019-01-06 DIAGNOSIS — M868X8 Other osteomyelitis, other site: Secondary | ICD-10-CM | POA: Diagnosis not present

## 2019-01-19 DIAGNOSIS — M25551 Pain in right hip: Secondary | ICD-10-CM | POA: Diagnosis not present

## 2019-01-19 DIAGNOSIS — M868X8 Other osteomyelitis, other site: Secondary | ICD-10-CM | POA: Diagnosis not present

## 2019-01-25 DIAGNOSIS — M25551 Pain in right hip: Secondary | ICD-10-CM | POA: Diagnosis not present

## 2019-04-27 ENCOUNTER — Other Ambulatory Visit: Payer: Self-pay

## 2019-04-27 ENCOUNTER — Encounter: Payer: Self-pay | Admitting: Adult Health

## 2019-04-27 ENCOUNTER — Ambulatory Visit: Payer: BC Managed Care – PPO | Admitting: Adult Health

## 2019-04-27 VITALS — BP 123/72 | HR 64 | Resp 16 | Ht 63.0 in | Wt 215.0 lb

## 2019-04-27 DIAGNOSIS — E669 Obesity, unspecified: Secondary | ICD-10-CM

## 2019-04-27 DIAGNOSIS — R5383 Other fatigue: Secondary | ICD-10-CM

## 2019-04-27 DIAGNOSIS — K5909 Other constipation: Secondary | ICD-10-CM

## 2019-04-27 DIAGNOSIS — E668 Other obesity: Secondary | ICD-10-CM | POA: Diagnosis not present

## 2019-04-27 MED ORDER — PHENTERMINE HCL 37.5 MG PO TABS: 37.5000 mg | ORAL_TABLET | Freq: Every day | ORAL | 0 refills | Status: DC

## 2019-04-27 NOTE — Progress Notes (Signed)
[facility] [place], [place] [address]  Internal MEDICINE  Office Visit Note  Patient Name: Hannah Stevens  [id number]  [id number]  Date of Service: 04/27/2019  Chief Complaint  Patient presents with  . Medical Management of Chronic Issues    weight loss management     HPI Pt is here today to restart her weight loss medication.  She reports a few months ago when covid started she did not come and get her RX refilled. She has gained 8 pounds since her last visit and she would like to start back on the medication.  She reports she works out 5 days a week, and drinks 100 oz of water a day.  She is also intermittent fasting.     Current Medication: Outpatient Encounter Medications as of 04/27/2019  Medication Sig  . etonogestrel-ethinyl estradiol (NUVARING) 0.12-0.015 MG/24HR vaginal ring INSERT VAGINALLY AND LEAVE IN PLACE FOR 3 CONSECUTIVE WEEKS, THEN REMOVE FOR 1 WEEK  . [DISCONTINUED] docusate sodium (STOOL SOFTENER LAXATIVE) 100 MG capsule Take 1 capsule (100 mg total) by mouth daily. (Patient not taking: Reported on 04/27/2019)  . [DISCONTINUED] phentermine (ADIPEX-P) 37.5 MG tablet Take 1 tablet (37.5 mg total) by mouth daily before breakfast. (Patient not taking: Reported on 04/27/2019)   No facility-administered encounter medications on file as of 04/27/2019.     Surgical History: Past Surgical History:  Procedure Laterality Date  . TONSILLECTOMY    . WISDOM TOOTH EXTRACTION      Medical History: Past Medical History:  Diagnosis Date  . Anxiety   . Ovarian cyst   . Pelvic pain   . Vaccine for human papilloma virus (HPV) types 6, 11, 16, and 18 administered     Family History: Family History  Problem Relation Age of Onset  . Diabetes Father     Social History   Socioeconomic History  . Marital status: Single    Spouse name: Not on file  . Number of children: Not on file  . Years of education: Not on file  . Highest education  level: Not on file  Occupational History  . Not on file  Social Needs  . Financial resource strain: Not on file  . Food insecurity    Worry: Not on file    Inability: Not on file  . Transportation needs    Medical: Not on file    Non-medical: Not on file  Tobacco Use  . Smoking status: Former Smoker    Years: 1.00    Quit date: 10/14/2014    Years since quitting: 4.5  . Smokeless tobacco: Never Used  Substance and Sexual Activity  . Alcohol use: Yes    Alcohol/week: 0.0 standard drinks  . Drug use: No  . Sexual activity: Yes    Birth control/protection: Inserts    Comment: Nuvaring  Lifestyle  . Physical activity    Days per week: Not on file    Minutes per session: Not on file  . Stress: Not on file  Relationships  . Social Herbalist on phone: Not on file    Gets together: Not on file    Attends religious service: Not on file    Active member of club or organization: Not on file    Attends meetings of clubs or organizations: Not on file    Relationship status: Not on file  . Intimate partner violence    Fear of current or ex partner: Not on file  Emotionally abused: Not on file    Physically abused: Not on file    Forced sexual activity: Not on file  Other Topics Concern  . Not on file  Social History Narrative  . Not on file      Review of Systems  Constitutional: Negative for chills, fatigue and unexpected weight change.  HENT: Negative for congestion, rhinorrhea, sneezing and sore throat.   Eyes: Negative for photophobia, pain and redness.  Respiratory: Negative for cough, chest tightness and shortness of breath.   Cardiovascular: Negative for chest pain and palpitations.  Gastrointestinal: Negative for abdominal pain, constipation, diarrhea, nausea and vomiting.  Endocrine: Negative.   Genitourinary: Negative for dysuria and frequency.  Musculoskeletal: Negative for arthralgias, back pain, joint swelling and neck pain.  Skin: Negative for  rash.  Allergic/Immunologic: Negative.   Neurological: Negative for tremors and numbness.  Hematological: Negative for adenopathy. Does not bruise/bleed easily.  Psychiatric/Behavioral: Negative for behavioral problems and sleep disturbance. The patient is not nervous/anxious.     Vital Signs: BP 123/72   Pulse 64   Resp 16   Ht 5\' 3"  (1.6 m)   Wt 215 lb (97.5 kg)   SpO2 99%   BMI 38.09 kg/m    Physical Exam Vitals signs and nursing note reviewed.  Constitutional:      General: She is not in acute distress.    Appearance: She is well-developed. She is not diaphoretic.  HENT:     Head: Normocephalic and atraumatic.     Mouth/Throat:     Pharynx: No oropharyngeal exudate.  Eyes:     Pupils: Pupils are equal, round, and reactive to light.  Neck:     Musculoskeletal: Normal range of motion and neck supple.     Thyroid: No thyromegaly.     Vascular: No JVD.     Trachea: No tracheal deviation.  Cardiovascular:     Rate and Rhythm: Normal rate and regular rhythm.     Heart sounds: Normal heart sounds. No murmur. No friction rub. No gallop.   Pulmonary:     Effort: Pulmonary effort is normal. No respiratory distress.     Breath sounds: Normal breath sounds. No wheezing or rales.  Chest:     Chest wall: No tenderness.  Abdominal:     Palpations: Abdomen is soft.     Tenderness: There is no abdominal tenderness. There is no guarding.  Musculoskeletal: Normal range of motion.  Lymphadenopathy:     Cervical: No cervical adenopathy.  Skin:    General: Skin is warm and dry.  Neurological:     Mental Status: She is alert and oriented to person, place, and time.     Cranial Nerves: No cranial nerve deficit.  Psychiatric:        Behavior: Behavior normal.        Thought Content: Thought content normal.        Judgment: Judgment normal.     Assessment/Plan: 1. Other constipation Pt reports good water intake, and has not had problems recently.  She knows that phentermine  can increase these symptoms.   2. Other fatigue Will continue to monitor.   3. Moderate obesity Obesity Counseling: Risk Assessment: An assessment of behavioral risk factors was made today and includes lack of exercise sedentary lifestyle, lack of portion control and poor dietary habits.  Risk Modification Advice: She was counseled on portion control guidelines. Restricting daily caloric intake to. . The detrimental long term effects of obesity on her health and  ongoing poor compliance was also discussed with the patient.  There is a liability release in patients' chart. There has been a 10 minute discussion about the side effects including but not limited to elevated blood pressure, anxiety, lack of sleep and dry mouth. Pt understands and will like to start/continue on appetite suppressant at this time. There will be one month RX given at the time of visit with proper follow up. Nova diet plan with restricted calories is given to the pt. Pt understands and agrees with  plan of treatment - phentermine (ADIPEX-P) 37.5 MG tablet; Take 1 tablet (37.5 mg total) by mouth daily before breakfast.  Dispense: 30 tablet; Refill: 0  General Counseling: Briea verbalizes understanding of the findings of todays visit and agrees with plan of treatment. I have discussed any further diagnostic evaluation that may be needed or ordered today. We also reviewed her medications today. she has been encouraged to call the office with any questions or concerns that should arise related to todays visit.    No orders of the defined types were placed in this encounter.   No orders of the defined types were placed in this encounter.   Time spent: 20 Minutes   This patient was seen by Blima LedgerAdam Gerardo Caiazzo AGNP-C in Collaboration with Dr Lyndon CodeFozia M Khan as a part of collaborative care agreement     Johnna AcostaAdam J. Salam Chesterfield AGNP-C Internal medicine

## 2019-05-17 ENCOUNTER — Other Ambulatory Visit: Payer: Self-pay

## 2019-05-17 ENCOUNTER — Other Ambulatory Visit: Payer: Self-pay | Admitting: Obstetrics and Gynecology

## 2019-05-17 DIAGNOSIS — Z20828 Contact with and (suspected) exposure to other viral communicable diseases: Secondary | ICD-10-CM | POA: Diagnosis not present

## 2019-05-17 DIAGNOSIS — Z3044 Encounter for surveillance of vaginal ring hormonal contraceptive device: Secondary | ICD-10-CM

## 2019-05-17 MED ORDER — ETONOGESTREL-ETHINYL ESTRADIOL 0.12-0.015 MG/24HR VA RING: VAGINAL_RING | VAGINAL | 2 refills | Status: DC

## 2019-05-24 ENCOUNTER — Ambulatory Visit: Payer: BC Managed Care – PPO | Admitting: Adult Health

## 2019-05-24 ENCOUNTER — Other Ambulatory Visit: Payer: Self-pay

## 2019-05-24 ENCOUNTER — Encounter: Payer: Self-pay | Admitting: Adult Health

## 2019-05-24 VITALS — BP 124/73 | HR 88 | Resp 16 | Ht 63.0 in | Wt 212.0 lb

## 2019-05-24 DIAGNOSIS — E668 Other obesity: Secondary | ICD-10-CM | POA: Diagnosis not present

## 2019-05-24 DIAGNOSIS — K5909 Other constipation: Secondary | ICD-10-CM

## 2019-05-24 DIAGNOSIS — R5383 Other fatigue: Secondary | ICD-10-CM | POA: Diagnosis not present

## 2019-05-24 MED ORDER — PHENTERMINE HCL 37.5 MG PO TABS: 37.5000 mg | ORAL_TABLET | Freq: Every day | ORAL | 0 refills | Status: DC

## 2019-05-24 NOTE — Progress Notes (Signed)
[facility] [address] [place], [place] [id number]  Internal MEDICINE  Office Visit Note  Patient Name: Hannah DalesLeah M Stallings  [id number] 02, 1992  [id number]  Date of Service: 05/24/2019  Chief Complaint  Patient presents with  . Medical Management of Chronic Issues    weight loss management     HPI  Pt is here for follow up.  Patient is using phentermine for weight loss.  Since our last visit they have lost 3 pounds.  The patient denies any chest pain, palpitations, shortness of breath, constipation, headaches or any other side effects of the medication.  Patient wishes to continue to use this medication for weight loss at this time. Pt is slightly tachycardic.  She just completed a work out, and is drinking coffee.  Repeat HR is 88 She also reports by her scale, she has lost 12 pounds.   Current Medication: Outpatient Encounter Medications as of 05/24/2019  Medication Sig  . etonogestrel-ethinyl estradiol (NUVARING) 0.12-0.015 MG/24HR vaginal ring INSERT VAGINALLY AND LEAVE IN PLACE FOR 3 CONSECUTIVE WEEKS, THEN REMOVE FOR 1 WEEK  . phentermine (ADIPEX-P) 37.5 MG tablet Take 1 tablet (37.5 mg total) by mouth daily before breakfast.   No facility-administered encounter medications on file as of 05/24/2019.     Surgical History: Past Surgical History:  Procedure Laterality Date  . TONSILLECTOMY    . WISDOM TOOTH EXTRACTION      Medical History: Past Medical History:  Diagnosis Date  . Anxiety   . Ovarian cyst   . Pelvic pain   . Vaccine for human papilloma virus (HPV) types 6, 11, 16, and 18 administered     Family History: Family History  Problem Relation Age of Onset  . Diabetes Father     Social History   Socioeconomic History  . Marital status: Single    Spouse name: Not on file  . Number of children: Not on file  . Years of education: Not on file  . Highest education level: Not on file  Occupational History  . Not on file  Social Needs  . Financial  resource strain: Not on file  . Food insecurity    Worry: Not on file    Inability: Not on file  . Transportation needs    Medical: Not on file    Non-medical: Not on file  Tobacco Use  . Smoking status: Former Smoker    Years: 1.00    Quit date: 10/14/2014    Years since quitting: 4.6  . Smokeless tobacco: Never Used  Substance and Sexual Activity  . Alcohol use: Yes    Alcohol/week: 0.0 standard drinks  . Drug use: No  . Sexual activity: Yes    Birth control/protection: Inserts    Comment: Nuvaring  Lifestyle  . Physical activity    Days per week: Not on file    Minutes per session: Not on file  . Stress: Not on file  Relationships  . Social Musicianconnections    Talks on phone: Not on file    Gets together: Not on file    Attends religious service: Not on file    Active member of club or organization: Not on file    Attends meetings of clubs or organizations: Not on file    Relationship status: Not on file  . Intimate partner violence    Fear of current or ex partner: Not on file    Emotionally abused: Not on file    Physically abused: Not on file  Forced sexual activity: Not on file  Other Topics Concern  . Not on file  Social History Narrative  . Not on file      Review of Systems  Constitutional: Negative for chills, fatigue and unexpected weight change.  HENT: Negative for congestion, rhinorrhea, sneezing and sore throat.   Eyes: Negative for photophobia, pain and redness.  Respiratory: Negative for cough, chest tightness and shortness of breath.   Cardiovascular: Negative for chest pain and palpitations.  Gastrointestinal: Negative for abdominal pain, constipation, diarrhea, nausea and vomiting.  Endocrine: Negative.   Genitourinary: Negative for dysuria and frequency.  Musculoskeletal: Negative for arthralgias, back pain, joint swelling and neck pain.  Skin: Negative for rash.  Allergic/Immunologic: Negative.   Neurological: Negative for tremors and  numbness.  Hematological: Negative for adenopathy. Does not bruise/bleed easily.  Psychiatric/Behavioral: Negative for behavioral problems and sleep disturbance. The patient is not nervous/anxious.     Vital Signs: BP 124/73   Pulse (!) 120   Resp 16   Ht 5\' 3"  (1.6 m)   Wt 212 lb (96.2 kg)   SpO2 98%   BMI 37.55 kg/m    Physical Exam Vitals signs and nursing note reviewed.  Constitutional:      General: She is not in acute distress.    Appearance: She is well-developed. She is not diaphoretic.  HENT:     Head: Normocephalic and atraumatic.     Mouth/Throat:     Pharynx: No oropharyngeal exudate.  Eyes:     Pupils: Pupils are equal, round, and reactive to light.  Neck:     Musculoskeletal: Normal range of motion and neck supple.     Thyroid: No thyromegaly.     Vascular: No JVD.     Trachea: No tracheal deviation.  Cardiovascular:     Rate and Rhythm: Normal rate and regular rhythm.     Heart sounds: Normal heart sounds. No murmur. No friction rub. No gallop.   Pulmonary:     Effort: Pulmonary effort is normal. No respiratory distress.     Breath sounds: Normal breath sounds. No wheezing or rales.  Chest:     Chest wall: No tenderness.  Abdominal:     Palpations: Abdomen is soft.     Tenderness: There is no abdominal tenderness. There is no guarding.  Musculoskeletal: Normal range of motion.  Lymphadenopathy:     Cervical: No cervical adenopathy.  Skin:    General: Skin is warm and dry.  Neurological:     Mental Status: She is alert and oriented to person, place, and time.     Cranial Nerves: No cranial nerve deficit.  Psychiatric:        Behavior: Behavior normal.        Thought Content: Thought content normal.        Judgment: Judgment normal.     Assessment/Plan: 1. Other constipation Improved.  Continue present management.  2. Other fatigue Improving. Continue present management.   3. Moderate obesity Obesity Counseling: Risk Assessment: An  assessment of behavioral risk factors was made today and includes lack of exercise sedentary lifestyle, lack of portion control and poor dietary habits.  Risk Modification Advice: She was counseled on portion control guidelines. Restricting daily caloric intake to. . The detrimental long term effects of obesity on her health and ongoing poor compliance was also discussed with the patient.    General Counseling: Aubriella verbalizes understanding of the findings of todays visit and agrees with plan of treatment. I have  discussed any further diagnostic evaluation that may be needed or ordered today. We also reviewed her medications today. she has been encouraged to call the office with any questions or concerns that should arise related to todays visit.    No orders of the defined types were placed in this encounter.   No orders of the defined types were placed in this encounter.   Time spent: 15 Minutes   This patient was seen by Orson Gear AGNP-C in Collaboration with Dr Lavera Guise as a part of collaborative care agreement     Kendell Bane AGNP-C Internal medicine

## 2019-06-23 ENCOUNTER — Ambulatory Visit: Payer: BC Managed Care – PPO | Admitting: Adult Health

## 2019-07-04 NOTE — Progress Notes (Signed)
PCP:  Carlean Jews, NP   Chief Complaint  Patient presents with  . Gynecologic Exam     HPI:      [name] is a 28 y.o. G0P0000 who LMP was Patient's last menstrual period was 06/18/2019 (exact date)., presents today for her annual examination.  Her menses are regular every 28-30 days, lasting 5 days.  Dysmenorrhea mild, occurring premenstrually. She does not  BTB.  Sex activity: single partner, contraception - NuvaRing vaginal inserts.  Last Pap: 06/29/18  Results were: no abnormalities  Hx of STDs: none  There is no FH of breast cancer. There is no FH of ovarian cancer. The patient does do self-breast exams.  Tobacco use: few cigs infrequently with alcohol use Alcohol use: social drinker No drug use.  Exercise: very active  She does get adequate calcium but not Vitamin D in her diet.  Pt with hx of hemorrhoids. Really bothersome/painful 7/20 till present. Uses stool softeners and fiber supp without some relief. Coffee works the best to keep her regular. Otilio Carpen has rectal bleeding. Has tried prep H without relief.  Pt's RT hip/groin pain resolved after PT due to MSK etiology. Doing well.  Past Medical History:  Diagnosis Date  . Anxiety   . Ovarian cyst   . Pelvic pain   . Vaccine for human papilloma virus (HPV) types 6, 11, 16, and 18 administered     Past Surgical History:  Procedure Laterality Date  . TONSILLECTOMY    . WISDOM TOOTH EXTRACTION      Family History  Problem Relation Age of Onset  . Diabetes Father     Social History   Socioeconomic History  . Marital status: Single    Spouse name: Not on file  . Number of children: Not on file  . Years of education: Not on file  . Highest education level: Not on file  Occupational History  . Not on file  Social Needs  . Financial resource strain: Not on file  . Food insecurity    Worry: Not on file    Inability: Not on file  . Transportation needs    Medical: Not on file     Non-medical: Not on file  Tobacco Use  . Smoking status: Former Smoker    Years: 1.00    Quit date: 10/14/2014    Years since quitting: 4.7  . Smokeless tobacco: Never Used  Substance and Sexual Activity  . Alcohol use: Yes    Alcohol/week: 0.0 standard drinks  . Drug use: No  . Sexual activity: Yes    Birth control/protection: Inserts    Comment: Nuvaring  Lifestyle  . Physical activity    Days per week: Not on file    Minutes per session: Not on file  . Stress: Not on file  Relationships  . Social Musician on phone: Not on file    Gets together: Not on file    Attends religious service: Not on file    Active member of club or organization: Not on file    Attends meetings of clubs or organizations: Not on file    Relationship status: Not on file  . Intimate partner violence    Fear of current or ex partner: Not on file    Emotionally abused: Not on file    Physically abused: Not on file    Forced sexual activity: Not on file  Other Topics Concern  . Not on file  Social History Narrative  . Not on file    Current Meds  Medication Sig  . etonogestrel-ethinyl estradiol (NUVARING) 0.12-0.015 MG/24HR vaginal ring INSERT VAGINALLY AND LEAVE IN PLACE FOR 3 CONSECUTIVE WEEKS, THEN REMOVE FOR 1 WEEK  . phentermine (ADIPEX-P) 37.5 MG tablet Take 1 tablet (37.5 mg total) by mouth daily before breakfast.  . [DISCONTINUED] etonogestrel-ethinyl estradiol (NUVARING) 0.12-0.015 MG/24HR vaginal ring INSERT VAGINALLY AND LEAVE IN PLACE FOR 3 CONSECUTIVE WEEKS, THEN REMOVE FOR 1 WEEK     ROS:  Review of Systems  Constitutional: Negative for fatigue, fever and unexpected weight change.  Respiratory: Negative for cough, shortness of breath and wheezing.   Cardiovascular: Negative for chest pain, palpitations and leg swelling.  Gastrointestinal: Negative for blood in stool, constipation, diarrhea, nausea and vomiting.  Endocrine: Negative for cold intolerance, heat  intolerance and polyuria.  Genitourinary: Negative for dyspareunia, dysuria, flank pain, frequency, genital sores, hematuria, menstrual problem, pelvic pain, urgency, vaginal bleeding, vaginal discharge and vaginal pain.  Musculoskeletal: Negative for back pain, joint swelling and myalgias.  Skin: Negative for rash.  Neurological: Negative for dizziness, syncope, light-headedness, numbness and headaches.  Hematological: Negative for adenopathy.  Psychiatric/Behavioral: Negative for agitation, confusion, sleep disturbance and suicidal ideas. The patient is not nervous/anxious.       Objective: BP 130/80   Ht 5\' 3"  (1.6 m)   Wt 215 lb (97.5 kg)   LMP 06/18/2019 (Exact Date)   BMI 38.09 kg/m    Physical Exam Constitutional:      Appearance: She is well-developed.  Genitourinary:     Vulva, vagina, uterus, right adnexa and left adnexa normal.     No vulval lesion or tenderness noted.     No vaginal discharge, erythema or tenderness.     No cervical motion tenderness or polyp.     Uterus is not enlarged or tender.     No right or left adnexal mass present.     Right adnexa not tender.     Left adnexa not tender.  Rectum:     External hemorrhoid present.  Neck:     Musculoskeletal: Normal range of motion.     Thyroid: No thyromegaly.  Cardiovascular:     Rate and Rhythm: Normal rate and regular rhythm.     Heart sounds: Normal heart sounds. No murmur.  Pulmonary:     Effort: Pulmonary effort is normal.     Breath sounds: Normal breath sounds.  Chest:     Breasts:        Right: No mass, nipple discharge, skin change or tenderness.        Left: No mass, nipple discharge, skin change or tenderness.  Abdominal:     Palpations: Abdomen is soft.     Tenderness: There is no abdominal tenderness. There is no guarding.  Musculoskeletal: Normal range of motion.  Neurological:     General: No focal deficit present.     Mental Status: She is alert and oriented to person, place, and  time.     Cranial Nerves: No cranial nerve deficit.  Skin:    General: Skin is warm and dry.  Psychiatric:        Mood and Affect: Mood normal.        Behavior: Behavior normal.        Thought Content: Thought content normal.        Judgment: Judgment normal.  Vitals signs reviewed.     Assessment/Plan: Encounter for annual routine gynecological examination  Encounter  for surveillance of vaginal ring hormonal contraceptive device - Plan: etonogestrel-ethinyl estradiol (NUVARING) 0.12-0.015 MG/24HR vaginal ring; nuvaring RF  External hemorrhoid - Plan: hydrocortisone-pramoxine (ANALPRAM-HC) 2.5-1 % rectal cream; try analpram crm. Keep stool soft. F/u prn.   Meds ordered this encounter  Medications  . hydrocortisone-pramoxine (ANALPRAM-HC) 2.5-1 % rectal cream    Sig: Place rectally 2 (two) times daily for 14 days.    Dispense:  30 g    Refill:  0    Order Specific Question:   Supervising Provider    Answer:   Gae Dry [id number]  . etonogestrel-ethinyl estradiol (NUVARING) 0.12-0.015 MG/24HR vaginal ring    Sig: INSERT VAGINALLY AND LEAVE IN PLACE FOR 3 CONSECUTIVE WEEKS, THEN REMOVE FOR 1 WEEK    Dispense:  3 each    Refill:  3    Order Specific Question:   Supervising Provider    Answer:   Gae Dry [[id number]]             GYN counsel adequate intake of calcium and vitamin D, diet and exercise     F/U  Return in about 1 year (around 07/04/2020).  Alicia B. Copland, PA-C 07/05/2019 8:58 AM

## 2019-07-05 ENCOUNTER — Encounter: Payer: Self-pay | Admitting: Obstetrics and Gynecology

## 2019-07-05 ENCOUNTER — Ambulatory Visit (INDEPENDENT_AMBULATORY_CARE_PROVIDER_SITE_OTHER): Payer: BC Managed Care – PPO | Admitting: Obstetrics and Gynecology

## 2019-07-05 ENCOUNTER — Other Ambulatory Visit: Payer: Self-pay

## 2019-07-05 VITALS — BP 130/80 | Ht 63.0 in | Wt 215.0 lb

## 2019-07-05 DIAGNOSIS — K644 Residual hemorrhoidal skin tags: Secondary | ICD-10-CM | POA: Insufficient documentation

## 2019-07-05 DIAGNOSIS — Z3044 Encounter for surveillance of vaginal ring hormonal contraceptive device: Secondary | ICD-10-CM

## 2019-07-05 DIAGNOSIS — Z01419 Encounter for gynecological examination (general) (routine) without abnormal findings: Secondary | ICD-10-CM

## 2019-07-05 HISTORY — DX: Residual hemorrhoidal skin tags: K64.4

## 2019-07-05 MED ORDER — HYDROCORT-PRAMOXINE (PERIANAL) 2.5-1 % EX CREA: TOPICAL_CREAM | Freq: Two times a day (BID) | CUTANEOUS | 0 refills | Status: AC

## 2019-07-05 MED ORDER — ETONOGESTREL-ETHINYL ESTRADIOL 0.12-0.015 MG/24HR VA RING: VAGINAL_RING | VAGINAL | 3 refills | Status: DC

## 2019-07-05 NOTE — Patient Instructions (Signed)
I value your feedback and entrusting us with your care. If you get a [facility] patient survey, I would appreciate you taking the time to let us know about your experience today. Thank you! 

## 2019-07-07 ENCOUNTER — Telehealth: Payer: Self-pay

## 2019-07-07 NOTE — Telephone Encounter (Signed)
Called pt, no answer, left detailed msg

## 2019-07-07 NOTE — Telephone Encounter (Signed)
There are multiple meds, but not sure what is covered on her formulary. Can check with pharmacy or insurance as to what is covered. Also ~$40 with [email or web address] coupon.

## 2019-07-07 NOTE — Telephone Encounter (Signed)
Please advise 

## 2019-07-07 NOTE — Telephone Encounter (Signed)
Pt states that her hemrrhoid cream that [facility] sent in this week is not covered by ins. Wondering if there is something different that she can be RX'ed?

## 2019-11-24 ENCOUNTER — Telehealth: Payer: Self-pay

## 2019-11-24 NOTE — Telephone Encounter (Signed)
RECORD REQUEST COMPLETED. COLLECTED FORM FEE OVER THE PHONE AND LAID CONFIRMATION ON TATS DESK, PLACED RECORDS IN MAIL.

## 2019-12-18 DIAGNOSIS — Z20828 Contact with and (suspected) exposure to other viral communicable diseases: Secondary | ICD-10-CM | POA: Diagnosis not present

## 2020-01-17 ENCOUNTER — Other Ambulatory Visit: Payer: Self-pay

## 2020-01-17 ENCOUNTER — Encounter: Payer: Self-pay | Admitting: Nurse Practitioner

## 2020-01-17 ENCOUNTER — Ambulatory Visit (INDEPENDENT_AMBULATORY_CARE_PROVIDER_SITE_OTHER): Payer: BC Managed Care – PPO | Admitting: Nurse Practitioner

## 2020-01-17 VITALS — BP 132/68 | HR 90 | Temp 97.8°F | Resp 16 | Ht 63.0 in | Wt 248.0 lb

## 2020-01-17 DIAGNOSIS — E669 Obesity, unspecified: Secondary | ICD-10-CM

## 2020-01-17 DIAGNOSIS — R635 Abnormal weight gain: Secondary | ICD-10-CM | POA: Diagnosis not present

## 2020-01-17 DIAGNOSIS — Z0001 Encounter for general adult medical examination with abnormal findings: Secondary | ICD-10-CM

## 2020-01-17 DIAGNOSIS — E668 Other obesity: Secondary | ICD-10-CM

## 2020-01-17 DIAGNOSIS — R300 Dysuria: Secondary | ICD-10-CM | POA: Diagnosis not present

## 2020-01-17 DIAGNOSIS — R7301 Impaired fasting glucose: Secondary | ICD-10-CM | POA: Diagnosis not present

## 2020-01-17 HISTORY — DX: Dysuria: R30.0

## 2020-01-17 MED ORDER — PHENTERMINE HCL 37.5 MG PO TABS: 37.5000 mg | ORAL_TABLET | Freq: Every day | ORAL | 0 refills | Status: DC

## 2020-01-17 NOTE — Progress Notes (Signed)
[facility] [address] [place], [place] [address]  Internal MEDICINE  Office Visit Note  Patient Name: Hannah Stevens  [id number]  [id number]  Date of Service: 01/17/2020   Pt is here for routine health maintenance examination  Chief Complaint  Patient presents with  . Annual Exam  . Anxiety     The patient is here for health maintenance exam. She has got married since her last visit. She is concerned about weight gain of more than 30 pounds since her last visit in this office. She has started to do intermittent fasting. Has started drinking protein drinks to supplement some of her meals. She has also started [employer] work outs. Has completed 26 days in a row. She is planning to start trying to get pregnant in June. She would like to lose at least 20 pounds prior to trying to get pregnant. She does not want obesity to be one thing that causes trouble in trying to conceive a child. She does have GYN provider she sees for well-woman exams. Her last pap smear was done in 2019 and was normal. She should have routine, fasting labs done.       Current Medication: Outpatient Encounter Medications as of 01/17/2020  Medication Sig  . etonogestrel-ethinyl estradiol (NUVARING) 0.12-0.015 MG/24HR vaginal ring INSERT VAGINALLY AND LEAVE IN PLACE FOR 3 CONSECUTIVE WEEKS, THEN REMOVE FOR 1 WEEK  . phentermine (ADIPEX-P) 37.5 MG tablet Take 1 tablet (37.5 mg total) by mouth daily before breakfast.   No facility-administered encounter medications on file as of 01/17/2020.    Surgical History: Past Surgical History:  Procedure Laterality Date  . TONSILLECTOMY    . WISDOM TOOTH EXTRACTION      Medical History: Past Medical History:  Diagnosis Date  . Anxiety   . Ovarian cyst   . Pelvic pain   . Vaccine for human papilloma virus (HPV) types 6, 11, 16, and 18 administered     Family History: Family History  Problem Relation Age of Onset  . Diabetes Father       Review  of Systems  Constitutional: Positive for fatigue and unexpected weight change. Negative for activity change and chills.  HENT: Negative for congestion, postnasal drip, rhinorrhea, sneezing and sore throat.   Respiratory: Negative for cough, chest tightness, shortness of breath and wheezing.   Cardiovascular: Negative for chest pain and palpitations.  Gastrointestinal: Negative for abdominal pain, constipation, diarrhea, nausea and vomiting.  Endocrine: Negative for cold intolerance, heat intolerance, polydipsia and polyuria.  Musculoskeletal: Negative for arthralgias, back pain, joint swelling and neck pain.  Skin: Negative for rash.  Allergic/Immunologic: Negative for environmental allergies.  Neurological: Negative for dizziness, tremors, numbness and headaches.  Hematological: Negative for adenopathy. Does not bruise/bleed easily.  Psychiatric/Behavioral: Negative for behavioral problems (Depression), sleep disturbance and suicidal ideas. The patient is nervous/anxious.     \ Today's Vitals   01/17/20 1352  BP: 138/86  Pulse: (!) 108  Resp: 16  Temp: 97.8 F (36.6 C)  SpO2: 98%  Weight: 248 lb (112.5 kg)  Height: 5\' 3"  (1.6 m)   Body mass index is 43.93 kg/m.  Physical Exam Vitals and nursing note reviewed.  Constitutional:      General: She is not in acute distress.    Appearance: Normal appearance. She is well-developed. She is obese. She is not diaphoretic.  HENT:     Head: Normocephalic and atraumatic.     Mouth/Throat:     Pharynx: No oropharyngeal exudate.  Eyes:     Pupils: Pupils are equal, round, and reactive to light.  Neck:     Thyroid: No thyromegaly.     Vascular: No JVD.     Trachea: No tracheal deviation.  Cardiovascular:     Rate and Rhythm: Normal rate and regular rhythm.     Pulses: Normal pulses.     Heart sounds: Normal heart sounds. No murmur. No friction rub. No gallop.   Pulmonary:     Effort: Pulmonary effort is normal. No respiratory  distress.     Breath sounds: Normal breath sounds. No wheezing or rales.  Chest:     Chest wall: No tenderness.  Abdominal:     General: Bowel sounds are normal.     Palpations: Abdomen is soft.     Tenderness: There is no abdominal tenderness.  Musculoskeletal:        General: Normal range of motion.     Cervical back: Normal range of motion and neck supple.  Lymphadenopathy:     Cervical: No cervical adenopathy.  Skin:    General: Skin is warm and dry.  Neurological:     Mental Status: She is alert and oriented to person, place, and time.     Cranial Nerves: No cranial nerve deficit.  Psychiatric:        Attention and Perception: Attention and perception normal.        Mood and Affect: Affect normal. Mood is anxious.        Speech: Speech normal.        Behavior: Behavior normal. Behavior is cooperative.        Thought Content: Thought content normal.        Cognition and Memory: Cognition and memory normal.        Judgment: Judgment normal.    Assessment/Plan: 1. Encounter for general adult medical examination with abnormal findings Annual health maintenance exam today. Lab slip given to check routine, fasting labs.   2. Abnormal weight gain Check thyroid panel for further evaluation.   3. Impaired fasting glucose Check HgbA1c   4. Dysuria - Urinalysis, Routine w reflex microscopic   5. Moderate Obesity Restart phentermine daily. Limit calorie intake to 1200-1500 calories per day. Continue with regular exercise program.   General Counseling: Delynn verbalizes understanding of the findings of todays visit and agrees with plan of treatment. I have discussed any further diagnostic evaluation that may be needed or ordered today. We also reviewed her medications today. she has been encouraged to call the office with any questions or concerns that should arise related to todays visit.    Counseling:   There is a liability release in patients' chart. There has been a 10  minute discussion about the side effects including but not limited to elevated blood pressure, anxiety, lack of sleep and dry mouth. Pt understands and will like to start/continue on appetite suppressant at this time. There will be one month RX given at the time of visit with proper follow up. Nova diet plan with restricted calories is given to the pt. Pt understands and agrees with  plan of treatment  This patient was seen by Leretha Pol FNP Collaboration with Dr Lavera Guise as a part of collaborative care agreement  Orders Placed This Encounter  Procedures  . Urinalysis, Routine w reflex microscopic    Total time spent: 45 Minutes  Time spent includes review of chart, medications, test results, and follow up plan with the patient.  Lavera Guise, MD  Internal Medicine

## 2020-01-18 LAB — URINALYSIS, ROUTINE W REFLEX MICROSCOPIC
Bilirubin, UA: NEGATIVE
Glucose, UA: NEGATIVE
Ketones, UA: NEGATIVE
Leukocytes,UA: NEGATIVE
Nitrite, UA: NEGATIVE
Protein,UA: NEGATIVE
RBC, UA: NEGATIVE
Specific Gravity, UA: 1.013 (ref 1.005–1.030)
Urobilinogen, Ur: 0.2 mg/dL (ref 0.2–1.0)
pH, UA: 6.5 (ref 5.0–7.5)

## 2020-01-20 ENCOUNTER — Other Ambulatory Visit: Payer: Self-pay | Admitting: Nurse Practitioner

## 2020-01-20 DIAGNOSIS — E559 Vitamin D deficiency, unspecified: Secondary | ICD-10-CM | POA: Diagnosis not present

## 2020-01-20 DIAGNOSIS — Z0001 Encounter for general adult medical examination with abnormal findings: Secondary | ICD-10-CM | POA: Diagnosis not present

## 2020-01-20 DIAGNOSIS — I10 Essential (primary) hypertension: Secondary | ICD-10-CM | POA: Diagnosis not present

## 2020-01-20 DIAGNOSIS — R7301 Impaired fasting glucose: Secondary | ICD-10-CM | POA: Diagnosis not present

## 2020-01-20 DIAGNOSIS — R635 Abnormal weight gain: Secondary | ICD-10-CM | POA: Diagnosis not present

## 2020-01-21 LAB — CBC
Hematocrit: 40.2 % (ref 34.0–46.6)
Hemoglobin: 13.8 g/dL (ref 11.1–15.9)
MCH: 29.8 pg (ref 26.6–33.0)
MCHC: 34.3 g/dL (ref 31.5–35.7)
MCV: 87 fL (ref 79–97)
Platelets: 306 10*3/uL (ref 150–450)
RBC: 4.63 x10E6/uL (ref 3.77–5.28)
RDW: 11.8 % (ref 11.7–15.4)
WBC: 4.6 10*3/uL (ref 3.4–10.8)

## 2020-01-21 LAB — VITAMIN D 25 HYDROXY (VIT D DEFICIENCY, FRACTURES): Vit D, 25-Hydroxy: 35.9 ng/mL (ref 30.0–100.0)

## 2020-01-21 LAB — T4, FREE: Free T4: 1.36 ng/dL (ref 0.82–1.77)

## 2020-01-21 LAB — COMPREHENSIVE METABOLIC PANEL
ALT: 16 IU/L (ref 0–32)
AST: 18 IU/L (ref 0–40)
Albumin/Globulin Ratio: 1.7 (ref 1.2–2.2)
Albumin: 4.3 g/dL (ref 3.9–5.0)
Alkaline Phosphatase: 77 IU/L (ref 39–117)
BUN/Creatinine Ratio: 11 (ref 9–23)
BUN: 10 mg/dL (ref 6–20)
Bilirubin Total: 0.3 mg/dL (ref 0.0–1.2)
CO2: 23 mmol/L (ref 20–29)
Calcium: 9.3 mg/dL (ref 8.7–10.2)
Chloride: 102 mmol/L (ref 96–106)
Creatinine, Ser: 0.93 mg/dL (ref 0.57–1.00)
GFR calc Af Amer: 97 mL/min/{1.73_m2} (ref >59)
GFR calc non Af Amer: 84 mL/min/{1.73_m2} (ref >59)
Globulin, Total: 2.5 g/dL (ref 1.5–4.5)
Glucose: 87 mg/dL (ref 65–99)
Potassium: 4.3 mmol/L (ref 3.5–5.2)
Sodium: 139 mmol/L (ref 134–144)
Total Protein: 6.8 g/dL (ref 6.0–8.5)

## 2020-01-21 LAB — TSH: TSH: 1.52 u[IU]/mL (ref 0.450–4.500)

## 2020-01-21 LAB — LIPID PANEL W/O CHOL/HDL RATIO
Cholesterol, Total: 167 mg/dL (ref 100–199)
HDL: 58 mg/dL (ref >39)
LDL Chol Calc (NIH): 91 mg/dL (ref 0–99)
Triglycerides: 99 mg/dL (ref 0–149)
VLDL Cholesterol Cal: 18 mg/dL (ref 5–40)

## 2020-01-21 LAB — HGB A1C W/O EAG: Hgb A1c MFr Bld: 4.8 % (ref 4.8–5.6)

## 2020-01-25 NOTE — Progress Notes (Signed)
Labs all normal. Discuss at visit 02/12/2020

## 2020-02-01 ENCOUNTER — Encounter: Payer: BC Managed Care – PPO | Admitting: Nurse Practitioner

## 2020-02-10 ENCOUNTER — Telehealth: Payer: Self-pay

## 2020-02-10 NOTE — Telephone Encounter (Signed)
Confirmed and screened for 02-14-20 ov. 

## 2020-02-14 ENCOUNTER — Other Ambulatory Visit: Payer: Self-pay

## 2020-02-14 ENCOUNTER — Encounter: Payer: Self-pay | Admitting: Nurse Practitioner

## 2020-02-14 ENCOUNTER — Ambulatory Visit: Payer: BC Managed Care – PPO | Admitting: Nurse Practitioner

## 2020-02-14 VITALS — BP 120/72 | HR 75 | Temp 97.3°F | Resp 16 | Ht 63.0 in | Wt 238.0 lb

## 2020-02-14 DIAGNOSIS — E668 Other obesity: Secondary | ICD-10-CM | POA: Diagnosis not present

## 2020-02-14 DIAGNOSIS — K5909 Other constipation: Secondary | ICD-10-CM

## 2020-02-14 MED ORDER — PHENTERMINE HCL 37.5 MG PO TABS: 37.5000 mg | ORAL_TABLET | Freq: Every day | ORAL | 0 refills | Status: DC

## 2020-02-14 NOTE — Progress Notes (Signed)
[facility] [address] [place], [place] [address]  Internal MEDICINE  Office Visit Note  Patient Name: Hannah Stevens  [id number]  [id number]  Date of Service: 02/26/2020  Chief Complaint  Patient presents with  . Follow-up    weight management     The patient is here for follow up of weight management. She restarted on phentermine at her last visit. Has lost 10 pounds since last visit. She has some constipation associated with taking this medication. She has been taking OTC stool softeners which have kept this from happening. Blood pressure is stable. She has had no other side efffects associated with taking this medicatoin.      Current Medication: Outpatient Encounter Medications as of 02/14/2020  Medication Sig  . etonogestrel-ethinyl estradiol (NUVARING) 0.12-0.015 MG/24HR vaginal ring INSERT VAGINALLY AND LEAVE IN PLACE FOR 3 CONSECUTIVE WEEKS, THEN REMOVE FOR 1 WEEK  . phentermine (ADIPEX-P) 37.5 MG tablet Take 1 tablet (37.5 mg total) by mouth daily before breakfast.  . Prenatal Multivit-Min-Fe-FA (PRE-NATAL PO) Take by mouth daily.  . [DISCONTINUED] phentermine (ADIPEX-P) 37.5 MG tablet Take 1 tablet (37.5 mg total) by mouth daily before breakfast.   No facility-administered encounter medications on file as of 02/14/2020.    Surgical History: Past Surgical History:  Procedure Laterality Date  . TONSILLECTOMY    . WISDOM TOOTH EXTRACTION      Medical History: Past Medical History:  Diagnosis Date  . Anxiety   . Ovarian cyst   . Pelvic pain   . Vaccine for human papilloma virus (HPV) types 6, 11, 16, and 18 administered     Family History: Family History  Problem Relation Age of Onset  . Diabetes Father     Social History   Socioeconomic History  . Marital status: Married    Spouse name: Not on file  . Number of children: Not on file  . Years of education: Not on file  . Highest education level: Not on file  Occupational History  . Not  on file  Tobacco Use  . Smoking status: Former Smoker    Years: 1.00    Quit date: 10/14/2014    Years since quitting: 5.3  . Smokeless tobacco: Never Used  Substance and Sexual Activity  . Alcohol use: Yes    Alcohol/week: 0.0 standard drinks  . Drug use: No  . Sexual activity: Yes    Birth control/protection: Inserts    Comment: Nuvaring  Other Topics Concern  . Not on file  Social History Narrative  . Not on file   Social Determinants of Health   Financial Resource Strain:   . Difficulty of Paying Living Expenses:   Food Insecurity:   . Worried About Programme researcher, broadcasting/film/video in the Last Year:   . Barista in the Last Year:   Transportation Needs:   . Freight forwarder (Medical):   Marland Kitchen Lack of Transportation (Non-Medical):   Physical Activity:   . Days of Exercise per Week:   . Minutes of Exercise per Session:   Stress:   . Feeling of Stress :   Social Connections:   . Frequency of Communication with Friends and Family:   . Frequency of Social Gatherings with Friends and Family:   . Attends Religious Services:   . Active Member of Clubs or Organizations:   . Attends Banker Meetings:   Marland Kitchen Marital Status:   Intimate Partner Violence:   . Fear of Current or Ex-Partner:   .  Emotionally Abused:   Marland Kitchen Physically Abused:   . Sexually Abused:       Review of Systems  Constitutional: Positive for fatigue. Negative for activity change, chills and unexpected weight change.       Ten pound weight gain since her last visit.   HENT: Negative for congestion, postnasal drip, rhinorrhea, sneezing and sore throat.   Respiratory: Negative for cough, chest tightness, shortness of breath and wheezing.   Cardiovascular: Negative for chest pain and palpitations.  Gastrointestinal: Negative for abdominal pain, constipation, diarrhea, nausea and vomiting.  Endocrine: Negative for cold intolerance, heat intolerance, polydipsia and polyuria.  Musculoskeletal: Negative  for arthralgias, back pain, joint swelling and neck pain.  Skin: Negative for rash.  Allergic/Immunologic: Negative for environmental allergies.  Neurological: Negative for dizziness, tremors, numbness and headaches.  Hematological: Negative for adenopathy. Does not bruise/bleed easily.  Psychiatric/Behavioral: Negative for behavioral problems (Depression), sleep disturbance and suicidal ideas. The patient is nervous/anxious.     Today's Vitals   02/14/20 1032  BP: 120/72  Pulse: 75  Resp: 16  Temp: (!) 97.3 F (36.3 C)  SpO2: 97%  Weight: 238 lb (108 kg)  Height: 5\' 3"  (1.6 m)   Body mass index is 42.16 kg/m.  Physical Exam Vitals and nursing note reviewed.  Constitutional:      General: She is not in acute distress.    Appearance: Normal appearance. She is well-developed. She is obese. She is not diaphoretic.  HENT:     Head: Normocephalic and atraumatic.     Mouth/Throat:     Pharynx: No oropharyngeal exudate.  Eyes:     Pupils: Pupils are equal, round, and reactive to light.  Neck:     Thyroid: No thyromegaly.     Vascular: No JVD.     Trachea: No tracheal deviation.  Cardiovascular:     Rate and Rhythm: Normal rate and regular rhythm.     Pulses: Normal pulses.     Heart sounds: Normal heart sounds. No murmur. No friction rub. No gallop.   Pulmonary:     Effort: Pulmonary effort is normal. No respiratory distress.     Breath sounds: Normal breath sounds. No wheezing or rales.  Chest:     Chest wall: No tenderness.  Abdominal:     Palpations: Abdomen is soft.  Musculoskeletal:        General: Normal range of motion.     Cervical back: Normal range of motion and neck supple.  Lymphadenopathy:     Cervical: No cervical adenopathy.  Skin:    General: Skin is warm and dry.  Neurological:     Mental Status: She is alert and oriented to person, place, and time.     Cranial Nerves: No cranial nerve deficit.  Psychiatric:        Attention and Perception:  Attention and perception normal.        Mood and Affect: Affect normal. Mood is anxious.        Speech: Speech normal.        Behavior: Behavior normal. Behavior is cooperative.        Thought Content: Thought content normal.        Cognition and Memory: Cognition and memory normal.        Judgment: Judgment normal.   Assessment/Plan: 1. Other constipation Well managed with OTC stool softeners and laxatives. Continue to monitor.   2. Moderate obesity Improving. May continue phentermine 37.5mg  tablets daily. Limit calorie intake to 1500 calories  per day. incoporate exercise into daily routine.  - phentermine (ADIPEX-P) 37.5 MG tablet; Take 1 tablet (37.5 mg total) by mouth daily before breakfast.  Dispense: 30 tablet; Refill: 0  General Counseling: Kayci verbalizes understanding of the findings of todays visit and agrees with plan of treatment. I have discussed any further diagnostic evaluation that may be needed or ordered today. We also reviewed her medications today. she has been encouraged to call the office with any questions or concerns that should arise related to todays visit.    There is a liability release in patients' chart. There has been a 10 minute discussion about the side effects including but not limited to elevated blood pressure, anxiety, lack of sleep and dry mouth. Pt understands and will like to start/continue on appetite suppressant at this time. There will be one month RX given at the time of visit with proper follow up. Nova diet plan with restricted calories is given to the pt. Pt understands and agrees with  plan of treatment  This patient was seen by Leretha Pol FNP Collaboration with Dr Lavera Guise as a part of collaborative care agreement  Meds ordered this encounter  Medications  . phentermine (ADIPEX-P) 37.5 MG tablet    Sig: Take 1 tablet (37.5 mg total) by mouth daily before breakfast.    Dispense:  30 tablet    Refill:  0    Order Specific  Question:   Supervising Provider    Answer:   Lavera Guise [[id number]]    Total time spent: 20 Minutes   Time spent includes review of chart, medications, test results, and follow up plan with the patient.      Dr Lavera Guise Internal medicine

## 2020-03-01 IMAGING — CR DG HIP (WITH OR WITHOUT PELVIS) 2-3V*R*
1 series · 3 of 3 positions shown · non-contrast
Comparison: None.

CLINICAL DATA: Severe right groin pain

EXAM:
DG HIP (WITH OR WITHOUT PELVIS) 2-3V RIGHT

[Series 1: dg hip unilat w or w/o pelvis 2-3 views  · non-contrast · 0.14mm/px · 3 of 3 slices shown]
[im 1/3]
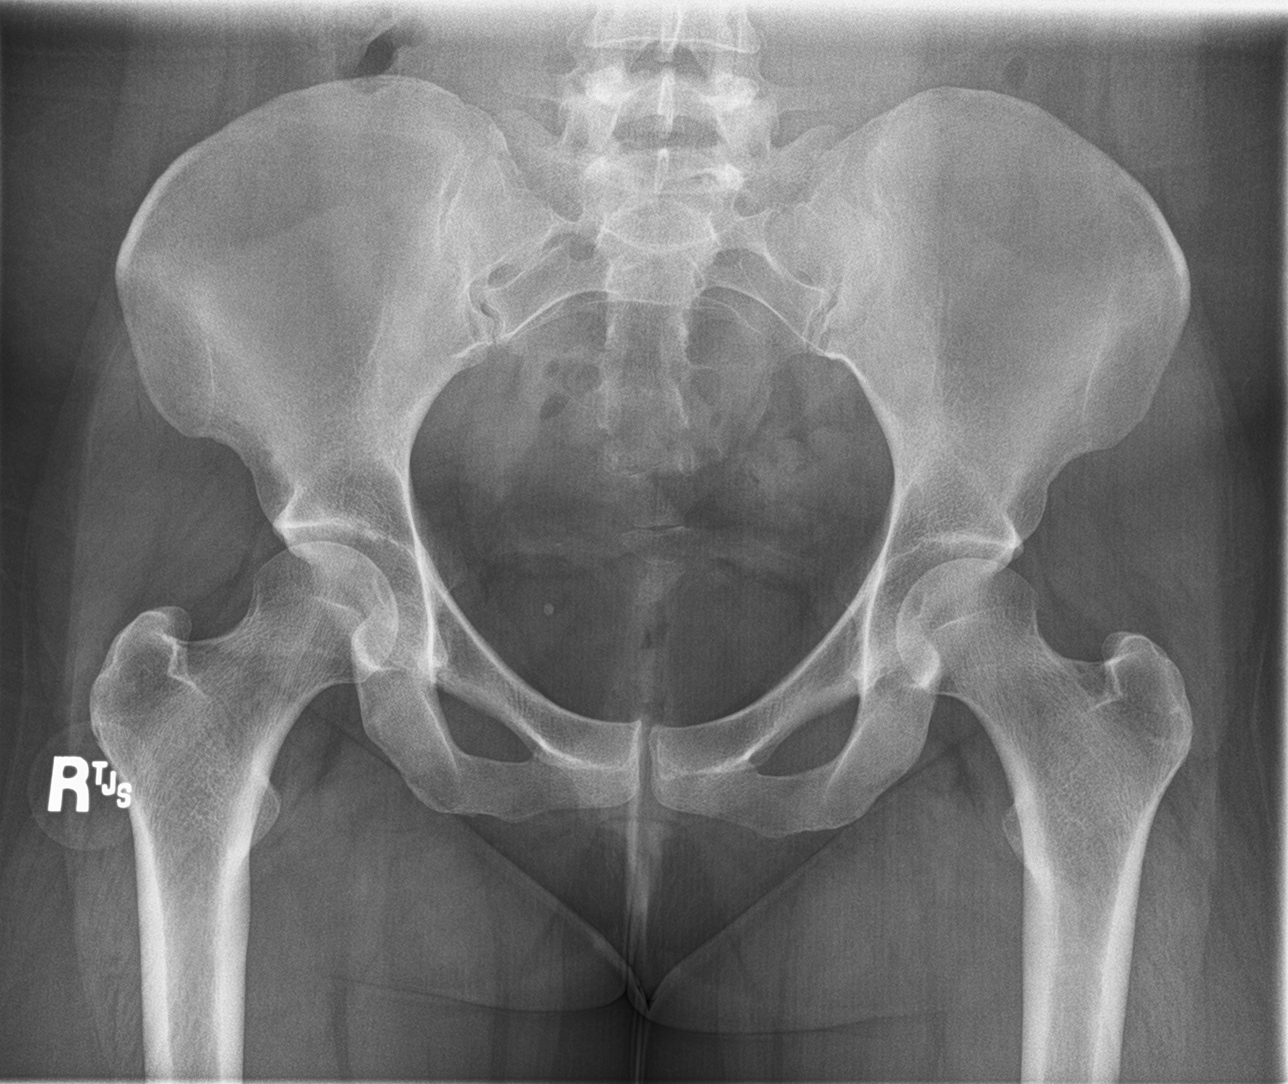
[im 2/3]
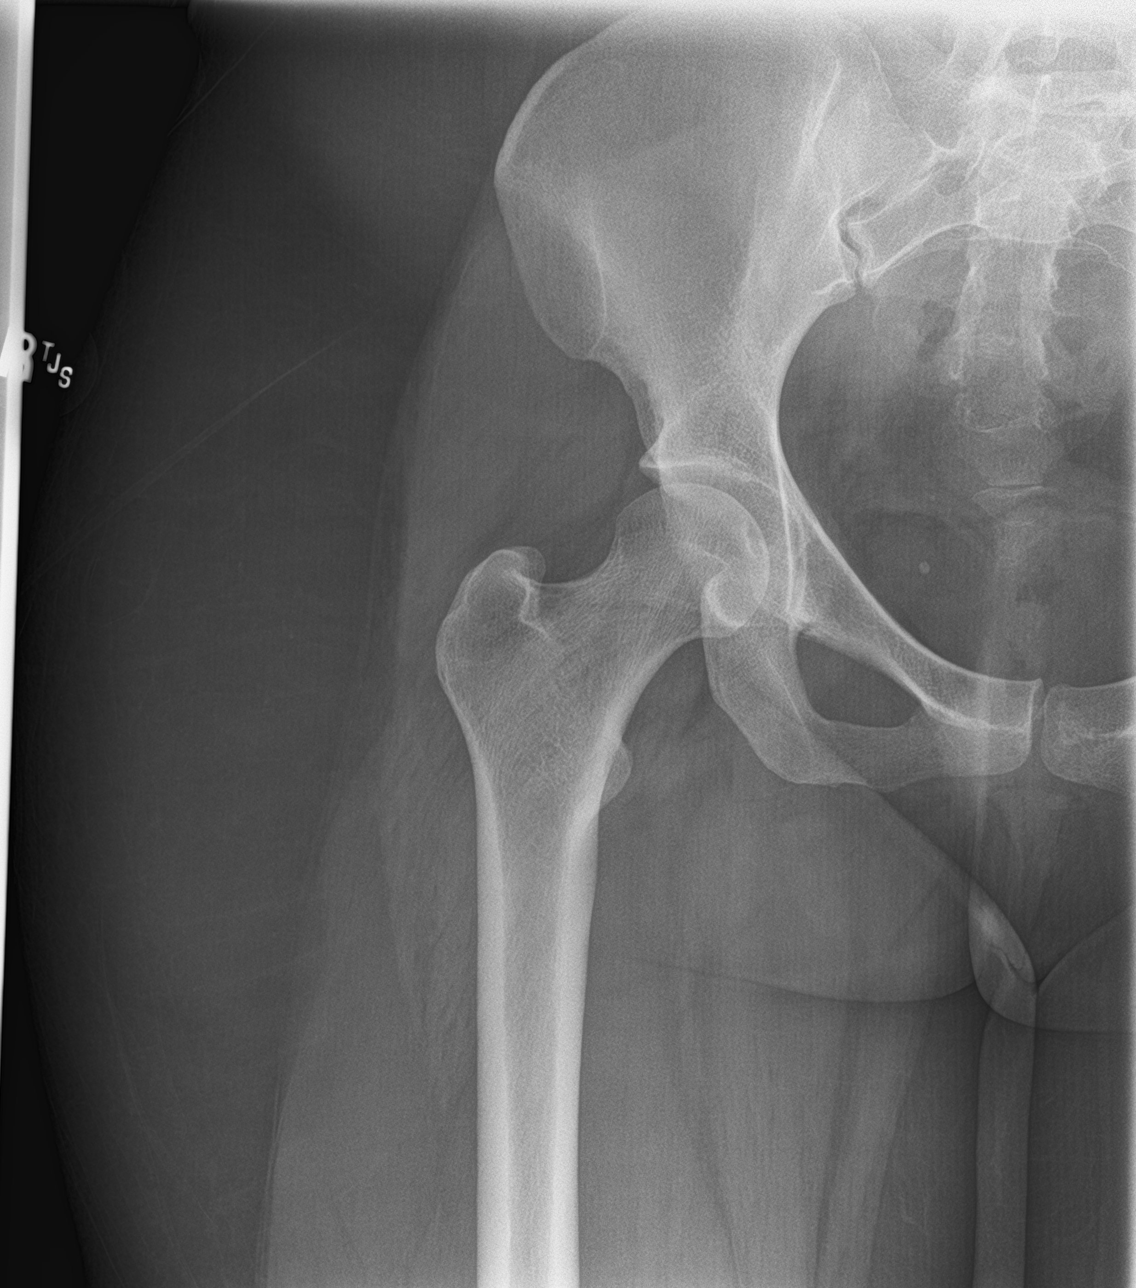
[im 3/3]
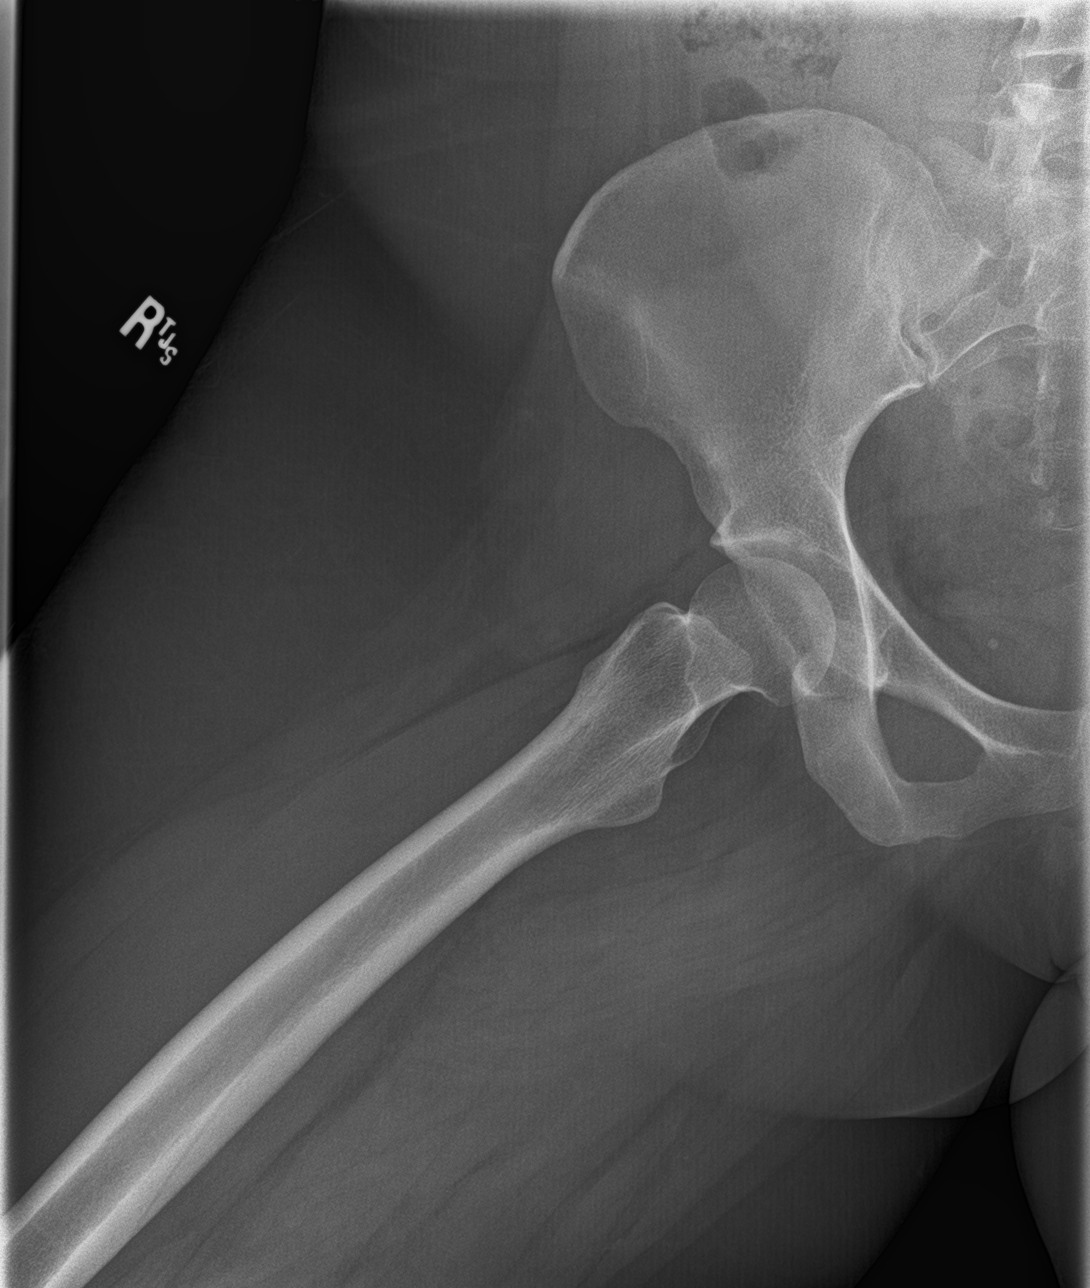

[3 of 3 positions shown; findings below may reference images not displayed]

FINDINGS: There is no evidence of hip fracture or dislocation. There is no
evidence of arthropathy or other focal bone abnormality.
IMPRESSION: Negative.

## 2020-03-10 ENCOUNTER — Telehealth: Payer: Self-pay

## 2020-03-10 NOTE — Telephone Encounter (Signed)
Confirmed and screened for 03-14-20 ov. 

## 2020-03-14 ENCOUNTER — Encounter: Payer: Self-pay | Admitting: Nurse Practitioner

## 2020-03-14 ENCOUNTER — Ambulatory Visit: Payer: BC Managed Care – PPO | Admitting: Nurse Practitioner

## 2020-03-14 ENCOUNTER — Other Ambulatory Visit: Payer: Self-pay

## 2020-03-14 VITALS — BP 133/82 | HR 80 | Temp 97.7°F | Resp 16 | Ht 63.0 in | Wt 233.0 lb

## 2020-03-14 DIAGNOSIS — R5383 Other fatigue: Secondary | ICD-10-CM

## 2020-03-14 DIAGNOSIS — E668 Other obesity: Secondary | ICD-10-CM

## 2020-03-14 MED ORDER — PHENTERMINE HCL 37.5 MG PO TABS: 37.5000 mg | ORAL_TABLET | Freq: Every day | ORAL | 0 refills | Status: DC

## 2020-03-14 NOTE — Progress Notes (Signed)
[facility] [address] [place], [place] [address]  Internal MEDICINE  Office Visit Note  Patient Name: Hannah Stevens  [id number]  [id number]  Date of Service: 03/14/2020  Chief Complaint  Patient presents with  . Follow-up    Weight Management    The patient is here for routine follow up of weight management. Currently taking phentermine to help suppress appetite. Goal is to lose 20 pounds so that she feels comfortable conceiving her first pregnancy. She has lost five pounds since her last visit. She has lost 15 pounds since initially starting on this program. She is limiting calorie intake to 1200 calories per day and she is exercising nearly every day. She denies negative side effects related to taking this medication.        Current Medication: Outpatient Encounter Medications as of 03/14/2020  Medication Sig  . etonogestrel-ethinyl estradiol (NUVARING) 0.12-0.015 MG/24HR vaginal ring INSERT VAGINALLY AND LEAVE IN PLACE FOR 3 CONSECUTIVE WEEKS, THEN REMOVE FOR 1 WEEK  . phentermine (ADIPEX-P) 37.5 MG tablet Take 1 tablet (37.5 mg total) by mouth daily before breakfast.  . Prenatal Multivit-Min-Fe-FA (PRE-NATAL PO) Take by mouth daily.  . [DISCONTINUED] phentermine (ADIPEX-P) 37.5 MG tablet Take 1 tablet (37.5 mg total) by mouth daily before breakfast.   No facility-administered encounter medications on file as of 03/14/2020.    Surgical History: Past Surgical History:  Procedure Laterality Date  . TONSILLECTOMY    . WISDOM TOOTH EXTRACTION      Medical History: Past Medical History:  Diagnosis Date  . Anxiety   . Ovarian cyst   . Pelvic pain   . Vaccine for human papilloma virus (HPV) types 6, 11, 16, and 18 administered     Family History: Family History  Problem Relation Age of Onset  . Diabetes Father     Social History   Socioeconomic History  . Marital status: Married    Spouse name: Not on file  . Number of children: Not on file  . Years  of education: Not on file  . Highest education level: Not on file  Occupational History  . Not on file  Tobacco Use  . Smoking status: Former Smoker    Years: 1.00    Quit date: 10/14/2014    Years since quitting: 5.4  . Smokeless tobacco: Never Used  Substance and Sexual Activity  . Alcohol use: Yes    Alcohol/week: 0.0 standard drinks    Comment: occasionally  . Drug use: No  . Sexual activity: Yes    Birth control/protection: Inserts    Comment: Nuvaring  Other Topics Concern  . Not on file  Social History Narrative  . Not on file   Social Determinants of Health   Financial Resource Strain:   . Difficulty of Paying Living Expenses:   Food Insecurity:   . Worried About Programme researcher, broadcasting/film/video in the Last Year:   . Barista in the Last Year:   Transportation Needs:   . Freight forwarder (Medical):   Marland Kitchen Lack of Transportation (Non-Medical):   Physical Activity:   . Days of Exercise per Week:   . Minutes of Exercise per Session:   Stress:   . Feeling of Stress :   Social Connections:   . Frequency of Communication with Friends and Family:   . Frequency of Social Gatherings with Friends and Family:   . Attends Religious Services:   . Active Member of Clubs or Organizations:   .  Attends Banker Meetings:   Marland Kitchen Marital Status:   Intimate Partner Violence:   . Fear of Current or Ex-Partner:   . Emotionally Abused:   Marland Kitchen Physically Abused:   . Sexually Abused:       Review of Systems  Constitutional: Positive for fatigue. Negative for activity change, chills and unexpected weight change.       Five pound weight loss since her last visit.   HENT: Negative for congestion, postnasal drip, rhinorrhea, sneezing and sore throat.   Respiratory: Negative for cough, chest tightness, shortness of breath and wheezing.   Cardiovascular: Negative for chest pain and palpitations.  Gastrointestinal: Negative for abdominal pain, constipation, diarrhea, nausea and  vomiting.  Endocrine: Negative for cold intolerance, heat intolerance, polydipsia and polyuria.  Musculoskeletal: Negative for arthralgias, back pain, joint swelling and neck pain.  Skin: Negative for rash.  Allergic/Immunologic: Negative for environmental allergies.  Neurological: Negative for dizziness, tremors, numbness and headaches.  Hematological: Negative for adenopathy. Does not bruise/bleed easily.  Psychiatric/Behavioral: Negative for behavioral problems (Depression), sleep disturbance and suicidal ideas. The patient is nervous/anxious.     Today's Vitals   03/14/20 1053  BP: 133/82  Pulse: 80  Resp: 16  Temp: 97.7 F (36.5 C)  SpO2: 99%  Weight: 233 lb (105.7 kg)  Height: 5\' 3"  (1.6 m)   Body mass index is 41.27 kg/m.  Physical Exam Vitals and nursing note reviewed.  Constitutional:      General: She is not in acute distress.    Appearance: Normal appearance. She is well-developed. She is obese. She is not diaphoretic.  HENT:     Head: Normocephalic and atraumatic.     Mouth/Throat:     Pharynx: No oropharyngeal exudate.  Eyes:     Pupils: Pupils are equal, round, and reactive to light.  Neck:     Thyroid: No thyromegaly.     Vascular: No JVD.     Trachea: No tracheal deviation.  Cardiovascular:     Rate and Rhythm: Normal rate and regular rhythm.     Pulses: Normal pulses.     Heart sounds: Normal heart sounds. No murmur. No friction rub. No gallop.   Pulmonary:     Effort: Pulmonary effort is normal. No respiratory distress.     Breath sounds: Normal breath sounds. No wheezing or rales.  Chest:     Chest wall: No tenderness.  Abdominal:     Palpations: Abdomen is soft.  Musculoskeletal:        General: Normal range of motion.     Cervical back: Normal range of motion and neck supple.  Lymphadenopathy:     Cervical: No cervical adenopathy.  Skin:    General: Skin is warm and dry.  Neurological:     Mental Status: She is alert and oriented to  person, place, and time.     Cranial Nerves: No cranial nerve deficit.  Psychiatric:        Attention and Perception: Attention and perception normal.        Mood and Affect: Affect normal. Mood is anxious.        Speech: Speech normal.        Behavior: Behavior normal. Behavior is cooperative.        Thought Content: Thought content normal.        Cognition and Memory: Cognition and memory normal.        Judgment: Judgment normal.    Assessment/Plan:  1. Other fatigue Improving with  weight loss. Will continue to monitor.   2. Moderate obesity Improving. May continue phentermine 37.5mg  tablets daily. Limit calorie intake to 1200 calories per day and exercise routinely.  - phentermine (ADIPEX-P) 37.5 MG tablet; Take 1 tablet (37.5 mg total) by mouth daily before breakfast.  Dispense: 30 tablet; Refill: 0   General Counseling: Vilma verbalizes understanding of the findings of todays visit and agrees with plan of treatment. I have discussed any further diagnostic evaluation that may be needed or ordered today. We also reviewed her medications today. she has been encouraged to call the office with any questions or concerns that should arise related to todays visit.   There is a liability release in patients' chart. There has been a 10 minute discussion about the side effects including but not limited to elevated blood pressure, anxiety, lack of sleep and dry mouth. Pt understands and will like to start/continue on appetite suppressant at this time. There will be one month RX given at the time of visit with proper follow up. Nova diet plan with restricted calories is given to the pt. Pt understands and agrees with  plan of treatment  This patient was seen by Leretha Pol FNP Collaboration with Dr Lavera Guise as a part of collaborative care agreement   Meds ordered this encounter  Medications  . phentermine (ADIPEX-P) 37.5 MG tablet    Sig: Take 1 tablet (37.5 mg total) by mouth daily  before breakfast.    Dispense:  30 tablet    Refill:  0    Order Specific Question:   Supervising Provider    Answer:   Lavera Guise [[id number]]    Total time spent: 20 Minutes   Time spent includes review of chart, medications, test results, and follow up plan with the patient.      Dr Lavera Guise Internal medicine

## 2020-04-07 ENCOUNTER — Telehealth: Payer: Self-pay

## 2020-04-07 NOTE — Telephone Encounter (Signed)
Lmom to confirm and screen for 04-11-20 ov. 

## 2020-04-10 ENCOUNTER — Telehealth: Payer: Self-pay

## 2020-04-10 NOTE — Telephone Encounter (Signed)
Patient cancelled appointment on 04/11/2020. klh

## 2020-04-11 ENCOUNTER — Ambulatory Visit: Payer: BC Managed Care – PPO | Admitting: Nurse Practitioner

## 2020-05-24 NOTE — Progress Notes (Signed)
Hannah Freshwater, NP   Chief Complaint  Patient presents with  . Consult    HPI:      Hannah Stevens is a 29 y.o. G0P0000 whose LMP was Patient's last menstrual period was 05/12/2020., presents today for pre-conception counseling. Was on nuvaring, stopped 6/21. Had spontaneous menses 7/5-04/20/20 and 7/30-05/15/20. Menses lasting 4 days, heavy initially, changing super tampons Q1-2 hrs, no BTB, mild dysmen, no meds taken. Hx of regular periods prior to nuvaring. Would like to conceive. Started PNVs. Doing period tracker app and urine ovulation pred kit this month (used just around ovulation time) and was neg. Pt worried about infertility and wonders what she needs to do.  Husband without children.  Last annual 9/21  Past Medical History:  Diagnosis Date  . Anxiety   . Ovarian cyst   . Pelvic pain   . Vaccine for human papilloma virus (HPV) types 6, 11, 16, and 18 administered     Past Surgical History:  Procedure Laterality Date  . TONSILLECTOMY    . WISDOM TOOTH EXTRACTION      Family History  Problem Relation Age of Onset  . Diabetes Father     Social History   Socioeconomic History  . Marital status: Married    Spouse name: Not on file  . Number of children: Not on file  . Years of education: Not on file  . Highest education level: Not on file  Occupational History  . Not on file  Tobacco Use  . Smoking status: Former Smoker    Years: 1.00    Quit date: 10/14/2014    Years since quitting: 5.6  . Smokeless tobacco: Never Used  Vaping Use  . Vaping Use: Never used  Substance and Sexual Activity  . Alcohol use: Yes    Alcohol/week: 0.0 standard drinks    Comment: occasionally  . Drug use: No  . Sexual activity: Yes    Birth control/protection: Inserts    Comment: Nuvaring  Other Topics Concern  . Not on file  Social History Narrative  . Not on file   Social Determinants of Health   Financial Resource Strain:   . Difficulty of Paying Living  Expenses:   Food Insecurity:   . Worried About Charity fundraiser in the Last Year:   . Arboriculturist in the Last Year:   Transportation Needs:   . Film/video editor (Medical):   Marland Kitchen Lack of Transportation (Non-Medical):   Physical Activity:   . Days of Exercise per Week:   . Minutes of Exercise per Session:   Stress:   . Feeling of Stress :   Social Connections:   . Frequency of Communication with Friends and Family:   . Frequency of Social Gatherings with Friends and Family:   . Attends Religious Services:   . Active Member of Clubs or Organizations:   . Attends Archivist Meetings:   Marland Kitchen Marital Status:   Intimate Partner Violence:   . Fear of Current or Ex-Partner:   . Emotionally Abused:   Marland Kitchen Physically Abused:   . Sexually Abused:     Outpatient Medications Prior to Visit  Medication Sig Dispense Refill  . Prenatal Multivit-Min-Fe-FA (PRE-NATAL PO) Take by mouth daily.     Marland Kitchen etonogestrel-ethinyl estradiol (NUVARING) 0.12-0.015 MG/24HR vaginal ring INSERT VAGINALLY AND LEAVE IN PLACE FOR 3 CONSECUTIVE WEEKS, THEN REMOVE FOR 1 WEEK (Patient not taking: Reported on 05/25/2020) 3 each 3  .  phentermine (ADIPEX-P) 37.5 MG tablet Take 1 tablet (37.5 mg total) by mouth daily before breakfast. (Patient not taking: Reported on 05/25/2020) 30 tablet 0   No facility-administered medications prior to visit.      ROS:  Review of Systems  Constitutional: Negative for fever.  Gastrointestinal: Negative for blood in stool, constipation, diarrhea, nausea and vomiting.  Genitourinary: Negative for dyspareunia, dysuria, flank pain, frequency, hematuria, urgency, vaginal bleeding, vaginal discharge and vaginal pain.  Musculoskeletal: Negative for back pain.  Skin: Negative for rash.  BREAST: No symptoms   OBJECTIVE:   Vitals:  BP 120/80   Ht _0  (1.6 m)   Wt 245 lb (111.1 kg)   LMP 05/12/2020   BMI 43.40 kg/m   Physical Exam Vitals reviewed.  Constitutional:       Appearance: She is well-developed.  Pulmonary:     Effort: Pulmonary effort is normal.  Musculoskeletal:        General: Normal range of motion.     Cervical back: Normal range of motion.  Skin:    General: Skin is warm and dry.  Neurological:     General: No focal deficit present.     Mental Status: She is alert and oriented to person, place, and time.     Cranial Nerves: No cranial nerve deficit.  Psychiatric:        Mood and Affect: Mood normal.        Behavior: Behavior normal.        Thought Content: Thought content normal.        Judgment: Judgment normal.    Assessment/Plan: Pre-conception counseling--discussed giving it more time to have better idea about cycle regularity off nuvaring and ovulation timing. Also need to do urine ovulation pred kits earlier in cycle. Discussed best time to conceive and recommended sex QOD for 3 days before and after ovulation. Given her age, she needs to try for a yr to conceive. Cont PNVs. Will f/u at 9/21 annual.     Return if symptoms worsen or fail to improve.  Keaisha Sublette B. Voncile Schwarz, PA-C 05/25/2020 12:01 PM

## 2020-05-25 ENCOUNTER — Encounter: Payer: Self-pay | Admitting: Obstetrics and Gynecology

## 2020-05-25 ENCOUNTER — Ambulatory Visit (INDEPENDENT_AMBULATORY_CARE_PROVIDER_SITE_OTHER): Payer: BC Managed Care – PPO | Admitting: Obstetrics and Gynecology

## 2020-05-25 ENCOUNTER — Other Ambulatory Visit: Payer: Self-pay

## 2020-05-25 VITALS — BP 120/80 | Ht 63.0 in | Wt 245.0 lb

## 2020-05-25 DIAGNOSIS — Z3169 Encounter for other general counseling and advice on procreation: Secondary | ICD-10-CM | POA: Diagnosis not present

## 2020-05-25 NOTE — Patient Instructions (Signed)
I value your feedback and entrusting us with your care. If you get a [facility] patient survey, I would appreciate you taking the time to let us know about your experience today. Thank you!  As of September 23, 2019, your lab results will be released to your MyChart immediately, before I even have a chance to see them. Please give me time to review them and contact you if there are any abnormalities. Thank you for your patience.  

## 2020-06-13 DIAGNOSIS — Z20822 Contact with and (suspected) exposure to covid-19: Secondary | ICD-10-CM | POA: Diagnosis not present

## 2020-06-28 DIAGNOSIS — Z20822 Contact with and (suspected) exposure to covid-19: Secondary | ICD-10-CM | POA: Diagnosis not present

## 2020-07-05 NOTE — Progress Notes (Signed)
PCP:  Carlean Jews, NP   Chief Complaint  Patient presents with  . Gynecologic Exam    annual exam     HPI:      [name] is a 29 y.o. G0P0000 who LMP was Patient's last menstrual period was 06/21/2020., presents today for her annual examination.  Her menses are regular every 25 days, lasting 4 days, mod to heavy flow since stopping BC. Dysmenorrhea none. Doesn't usually have BTB but did for 4 days mid cycle this month. Pt has also started new diet and sometimes notes period changes with diet changes.  Sex activity: single partner, contraception - none, wants to conceive. Stopped nuvaring 6/21. Taking PNVs.  Last Pap: 06/29/18  Results were: no abnormalities  Hx of STDs: none  There is no FH of breast cancer. There is no FH of ovarian cancer. The patient does do self-breast exams.  Tobacco use: none Alcohol use: social drinker No drug use.  Exercise: min active  She does get adequate calcium and Vitamin D in her diet.   Past Medical History:  Diagnosis Date  . Anxiety   . Ovarian cyst   . Pelvic pain   . Vaccine for human papilloma virus (HPV) types 6, 11, 16, and 18 administered     Past Surgical History:  Procedure Laterality Date  . TONSILLECTOMY    . WISDOM TOOTH EXTRACTION      Family History  Problem Relation Age of Onset  . Diabetes Father     Social History   Socioeconomic History  . Marital status: Married    Spouse name: Not on file  . Number of children: Not on file  . Years of education: Not on file  . Highest education level: Not on file  Occupational History  . Not on file  Tobacco Use  . Smoking status: Former Smoker    Years: 1.00    Quit date: 10/14/2014    Years since quitting: 5.7  . Smokeless tobacco: Never Used  Vaping Use  . Vaping Use: Never used  Substance and Sexual Activity  . Alcohol use: Yes    Alcohol/week: 0.0 standard drinks    Comment: occasionally  . Drug use: No  . Sexual activity: Yes    Birth  control/protection: Inserts    Comment: Nuvaring  Other Topics Concern  . Not on file  Social History Narrative  . Not on file   Social Determinants of Health   Financial Resource Strain:   . Difficulty of Paying Living Expenses: Not on file  Food Insecurity:   . Worried About Programme researcher, broadcasting/film/video in the Last Year: Not on file  . Ran Out of Food in the Last Year: Not on file  Transportation Needs:   . Lack of Transportation (Medical): Not on file  . Lack of Transportation (Non-Medical): Not on file  Physical Activity:   . Days of Exercise per Week: Not on file  . Minutes of Exercise per Session: Not on file  Stress:   . Feeling of Stress : Not on file  Social Connections:   . Frequency of Communication with Friends and Family: Not on file  . Frequency of Social Gatherings with Friends and Family: Not on file  . Attends Religious Services: Not on file  . Active Member of Clubs or Organizations: Not on file  . Attends Banker Meetings: Not on file  . Marital Status: Not on file  Intimate Partner Violence:   .  Fear of Current or Ex-Partner: Not on file  . Emotionally Abused: Not on file  . Physically Abused: Not on file  . Sexually Abused: Not on file    No outpatient medications have been marked as taking for the 07/06/20 encounter (Office Visit) with Minoru Chap, Ilona Sorrel, PA-C.     ROS:  Review of Systems  Constitutional: Negative for fatigue, fever and unexpected weight change.  Respiratory: Negative for cough, shortness of breath and wheezing.   Cardiovascular: Negative for chest pain, palpitations and leg swelling.  Gastrointestinal: Negative for blood in stool, constipation, diarrhea, nausea and vomiting.  Endocrine: Negative for cold intolerance, heat intolerance and polyuria.  Genitourinary: Negative for dyspareunia, dysuria, flank pain, frequency, genital sores, hematuria, menstrual problem, pelvic pain, urgency, vaginal bleeding, vaginal discharge and  vaginal pain.  Musculoskeletal: Negative for back pain, joint swelling and myalgias.  Skin: Negative for rash.  Neurological: Negative for dizziness, syncope, light-headedness, numbness and headaches.  Hematological: Negative for adenopathy.  Psychiatric/Behavioral: Negative for agitation, confusion, sleep disturbance and suicidal ideas. The patient is not nervous/anxious.       Objective: BP 116/72   Pulse 86   Resp 18   Ht 5\' 3"  (1.6 m)   Wt 245 lb (111.1 kg)   LMP 06/21/2020   SpO2 98%   BMI 43.40 kg/m    Physical Exam Constitutional:      Appearance: She is well-developed.  Genitourinary:     Vulva, vagina, uterus, right adnexa and left adnexa normal.     No vulval lesion or tenderness noted.     No vaginal discharge, erythema or tenderness.     No cervical motion tenderness or polyp.     Uterus is not enlarged or tender.     No right or left adnexal mass present.     Right adnexa not tender.     Left adnexa not tender.  Rectum:     External hemorrhoid present.  Neck:     Thyroid: No thyromegaly.  Cardiovascular:     Rate and Rhythm: Normal rate and regular rhythm.     Heart sounds: Normal heart sounds. No murmur heard.   Pulmonary:     Effort: Pulmonary effort is normal.     Breath sounds: Normal breath sounds.  Chest:     Breasts:        Right: No mass, nipple discharge, skin change or tenderness.        Left: No mass, nipple discharge, skin change or tenderness.  Abdominal:     Palpations: Abdomen is soft.     Tenderness: There is no abdominal tenderness. There is no guarding.  Musculoskeletal:        General: Normal range of motion.     Cervical back: Normal range of motion.  Neurological:     General: No focal deficit present.     Mental Status: She is alert and oriented to person, place, and time.     Cranial Nerves: No cranial nerve deficit.  Skin:    General: Skin is warm and dry.  Psychiatric:        Mood and Affect: Mood normal.         Behavior: Behavior normal.        Thought Content: Thought content normal.        Judgment: Judgment normal.  Vitals reviewed.     Assessment/Plan: Encounter for annual routine gynecological examination  Pre-conception counseling--cont PNVs. F/u prn NOB. F/u if no conception by 6/22. Discussed  trying for a yr if under 30 if cycles normal.    Breakthrough bleeding--1 episode. Will cont to follow cycles and eval further if sx persist.      GYN counsel adequate intake of calcium and vitamin D, diet and exercise     F/U  Return in about 1 year (around 07/06/2021).  Mishti Swanton B. Tanairy Payeur, PA-C 07/06/2020 9:48 AM

## 2020-07-06 ENCOUNTER — Other Ambulatory Visit: Payer: Self-pay

## 2020-07-06 ENCOUNTER — Ambulatory Visit (INDEPENDENT_AMBULATORY_CARE_PROVIDER_SITE_OTHER): Payer: BC Managed Care – PPO | Admitting: Obstetrics and Gynecology

## 2020-07-06 ENCOUNTER — Encounter: Payer: Self-pay | Admitting: Obstetrics and Gynecology

## 2020-07-06 VITALS — BP 116/72 | HR 86 | Resp 18 | Ht 63.0 in | Wt 245.0 lb

## 2020-07-06 DIAGNOSIS — Z01419 Encounter for gynecological examination (general) (routine) without abnormal findings: Secondary | ICD-10-CM

## 2020-07-06 DIAGNOSIS — N921 Excessive and frequent menstruation with irregular cycle: Secondary | ICD-10-CM

## 2020-07-06 DIAGNOSIS — Z3169 Encounter for other general counseling and advice on procreation: Secondary | ICD-10-CM | POA: Diagnosis not present

## 2020-07-06 NOTE — Patient Instructions (Signed)
I value your feedback and entrusting us with your care. If you get a [facility] patient survey, I would appreciate you taking the time to let us know about your experience today. Thank you!  As of September 23, 2019, your lab results will be released to your MyChart immediately, before I even have a chance to see them. Please give me time to review them and contact you if there are any abnormalities. Thank you for your patience.  

## 2020-07-11 ENCOUNTER — Ambulatory Visit: Payer: BC Managed Care – PPO | Admitting: Dermatology

## 2020-07-11 ENCOUNTER — Other Ambulatory Visit: Payer: Self-pay

## 2020-07-11 DIAGNOSIS — L989 Disorder of the skin and subcutaneous tissue, unspecified: Secondary | ICD-10-CM | POA: Diagnosis not present

## 2020-07-11 DIAGNOSIS — L0291 Cutaneous abscess, unspecified: Secondary | ICD-10-CM

## 2020-07-11 DIAGNOSIS — L02416 Cutaneous abscess of left lower limb: Secondary | ICD-10-CM | POA: Diagnosis not present

## 2020-07-11 MED ORDER — MUPIROCIN 2 % EX OINT: TOPICAL_OINTMENT | CUTANEOUS | 0 refills | Status: DC

## 2020-07-11 MED ORDER — DOXYCYCLINE HYCLATE 100 MG PO CAPS: ORAL_CAPSULE | ORAL | 0 refills | Status: DC

## 2020-07-11 NOTE — Progress Notes (Signed)
   Follow-Up Visit   Subjective  Hannah Stevens is a 29 y.o. female who presents for the following: Boil (Started Saturday with a little pain, had gotten progressively worse yesterday, very painful. She has used hot compresses. ). It hasn't drained.  She had a similar episode a couple years ago in the groin.   The following portions of the chart were reviewed this encounter and updated as appropriate:      Review of Systems:  No other skin or systemic complaints except as noted in HPI or Assessment and Plan.  Objective  Well appearing patient in no apparent distress; mood and affect are within normal limits.  A focused examination was performed including left medial thigh. Relevant physical exam findings are noted in the Assessment and Plan.  Objective  Left Medial Upper Thigh: Indistinct fluctuant nodule with erythema 4.0 x 2.5 cm; spreading adjacent erythema -edge outlined in pen Tender to touch   Assessment & Plan  Abscess Left Medial Upper Thigh  possible staph with early cellulitis I&D with culture today  Start doxycycline 100mg  take 1 po BID x 2 weeks, then decrease to QD until finished dsp #60 0Rf.  Start Mupirocin 2% ointment Apply to AA  with bandage changes. Start Hibiclens cleanser in shower daily to AA  Bacterial Culture performed today. Pt to RTC if redness spreading beyond marked edge  Doxycycline should be taken with food to prevent nausea. Do not lay down for 30 minutes after taking. Be cautious with sun exposure and use good sun protection while on this medication. Pregnant women should not take this medication.    doxycycline (VIBRAMYCIN) 100 MG capsule - Left Medial Upper Thigh  mupirocin ointment (BACTROBAN) 2 % - Left Medial Upper Thigh  Anaerobic and Aerobic Culture - Left Medial Upper Thigh  Incision and Drainage - Left Medial Upper Thigh Location: left medial upper thigh  Informed Consent: Discussed risks (permanent scarring, light or dark  discoloration, infection, pain, bleeding, bruising, redness, damage to adjacent structures, and recurrence of the lesion) and benefits of the procedure, as well as the alternatives.  Informed consent was obtained.  Preparation: The area was prepped with alcohol.  Anesthesia: Lidocaine 1% with epinephrine 16 cc  Procedure Details: An incision with 11 blade was made overlying the lesion. The lesion drained pus and blood. Bacterial culture obtained.  A small amount of fluid was drained.  Curettage performed. Antibiotic ointment and a sterile pressure dressing were applied. The patient tolerated procedure well.  Total number of lesions drained: 1  Plan: The patient was instructed on post-op care. Change dressing daily. Recommend OTC analgesia as needed for pain.   Painful skin lesion  Return if symptoms worsen or fail to improve.   Documentation: I have reviewed the above documentation for accuracy and completeness, and I agree with the above.  MD

## 2020-07-11 NOTE — Patient Instructions (Signed)
Wound Care Instructions  Cleanse wound gently with Hibiclens once a day then pat dry with clean gauze. Apply a thing coat of Mupirocin ointment over the wound (unless you have an allergy to this).  Cover the wound with fresh, clean, nonstick gauze and secure with paper tape. You may use Band-Aids in place of gauze and tape if the would is small enough, but would recommend trimming much of the tape off as there is often too much. Sometimes Band-Aids can irritate the skin.  If you experience any problems, such as abnormal amounts of bleeding, swelling, significant bruising, significant pain, or evidence of infection, please call the office immediately.     Start doxycycline 100mg  take 1 pill twice a day x 2 weeks, then decrease to once a day until finshed.  Doxycycline should be taken with food to prevent nausea. Do not lay down for 30 minutes after taking. Be cautious with sun exposure and use good sun protection while on this medication. Pregnant women should not take this medication.

## 2020-07-12 ENCOUNTER — Other Ambulatory Visit: Payer: Self-pay | Admitting: Dermatology

## 2020-07-12 DIAGNOSIS — L0291 Cutaneous abscess, unspecified: Secondary | ICD-10-CM | POA: Diagnosis not present

## 2020-07-17 LAB — ANAEROBIC AND AEROBIC CULTURE

## 2020-07-20 ENCOUNTER — Telehealth: Payer: Self-pay

## 2020-07-20 MED ORDER — CEFDINIR 300 MG PO CAPS: 300.0000 mg | ORAL_CAPSULE | Freq: Two times a day (BID) | ORAL | 0 refills | Status: DC

## 2020-07-20 NOTE — Telephone Encounter (Signed)
Patient advised and prescription sent in.   Per Dr. Willeen Niece:  Culture positive for group B strep Stop doxycycline Start Omnicef 300 mg po bid x 10 days

## 2020-08-30 DIAGNOSIS — Z20822 Contact with and (suspected) exposure to covid-19: Secondary | ICD-10-CM | POA: Diagnosis not present

## 2020-08-31 DIAGNOSIS — Z20822 Contact with and (suspected) exposure to covid-19: Secondary | ICD-10-CM | POA: Diagnosis not present

## 2020-08-31 DIAGNOSIS — Z03818 Encounter for observation for suspected exposure to other biological agents ruled out: Secondary | ICD-10-CM | POA: Diagnosis not present

## 2020-09-13 ENCOUNTER — Encounter: Payer: Self-pay | Admitting: Nurse Practitioner

## 2020-09-13 ENCOUNTER — Ambulatory Visit: Payer: BC Managed Care – PPO | Admitting: Nurse Practitioner

## 2020-09-13 VITALS — Temp 98.6°F | Ht 63.0 in | Wt 235.0 lb

## 2020-09-13 DIAGNOSIS — J069 Acute upper respiratory infection, unspecified: Secondary | ICD-10-CM

## 2020-09-13 DIAGNOSIS — R059 Cough, unspecified: Secondary | ICD-10-CM | POA: Diagnosis not present

## 2020-09-13 HISTORY — DX: Acute upper respiratory infection, unspecified: J06.9

## 2020-09-13 MED ORDER — HYDROCOD POLST-CPM POLST ER 10-8 MG/5ML PO SUER: 5.0000 mL | Freq: Two times a day (BID) | ORAL | 0 refills | Status: DC | PRN

## 2020-09-13 MED ORDER — PREDNISONE 10 MG (21) PO TBPK: ORAL_TABLET | ORAL | 0 refills | Status: DC

## 2020-09-13 MED ORDER — CEFUROXIME AXETIL 500 MG PO TABS: 500.0000 mg | ORAL_TABLET | Freq: Two times a day (BID) | ORAL | 0 refills | Status: DC

## 2020-09-13 NOTE — Progress Notes (Signed)
[facility] [address] [place], [place] [address]  Internal MEDICINE  Telephone Visit  Patient Name: Hannah Stevens  [id number]  [id number]  Date of Service: 09/13/2020  I connected with the patient at 12:34pm by telephone and verified the patients identity using two identifiers.   I discussed the limitations, risks, security and privacy concerns of performing an evaluation and management service by telephone and the availability of in person appointments. I also discussed with the patient that there may be a patient responsible charge related to the service.  The patient expressed understanding and agrees to proceed.    Chief Complaint  Patient presents with  . Telephone Assessment  . Telephone Screen    [id number]  . Cough    green mucus   . Sore Throat  . Sinusitis    The patient has been contacted via telephone for follow up visit due to concerns for spread of novel coronavirus.  The patient presents for sick visit. She states that she has been feeling poorly since Friday. Has head congestions, sore throat, headache, and now cough. She has green colored mucus coming from her nose. Was unable to go to work yesterday. Cough has become worse. Denies fever, chills, or body aches. Did have some tussionex leftover from previous URI which did help. She has taken two OTC COVID 19 tests. Both have come back negative. Second test was this morning and was negative. She is taking OTC medication for symptom relief. States that it is really not helping her at all. She feels very tired. Is still able to taste and smell.       Current Medication: Outpatient Encounter Medications as of 09/13/2020  Medication Sig  . Docusate Sodium (DSS) 100 MG CAPS docusate sodium 100 mg capsule  TAKE 1 CAPSULE BY MOUTH EVERY DAY  . doxycycline (VIBRAMYCIN) 100 MG capsule Take twice daily x 2 weeks, then decrease to once daily until finished.  . mupirocin ointment (BACTROBAN) 2 % Apply to affected  area 1-2 times a day with bandage changes.  . phentermine (ADIPEX-P) 37.5 MG tablet Take 1 tablet (37.5 mg total) by mouth daily before breakfast.  . Prenatal Multivit-Min-Fe-FA (PRE-NATAL PO) Take by mouth daily.   . [DISCONTINUED] cefdinir (OMNICEF) 300 MG capsule Take 1 capsule (300 mg total) by mouth 2 (two) times daily.  . cefUROXime (CEFTIN) 500 MG tablet Take 1 tablet (500 mg total) by mouth 2 (two) times daily with a meal.  . chlorpheniramine-HYDROcodone (TUSSIONEX PENNKINETIC ER) 10-8 MG/5ML SUER Take 5 mLs by mouth every 12 (twelve) hours as needed for cough.  . etonogestrel-ethinyl estradiol (NUVARING) 0.12-0.015 MG/24HR vaginal ring INSERT VAGINALLY AND LEAVE IN PLACE FOR 3 CONSECUTIVE WEEKS, THEN REMOVE FOR 1 WEEK (Patient not taking: Reported on 05/25/2020)  . predniSONE (STERAPRED UNI-PAK 21 TAB) 10 MG (21) TBPK tablet 6 day taper - take by mouth as directed for 6 days   No facility-administered encounter medications on file as of 09/13/2020.    Surgical History: Past Surgical History:  Procedure Laterality Date  . TONSILLECTOMY    . WISDOM TOOTH EXTRACTION      Medical History: Past Medical History:  Diagnosis Date  . Anxiety   . Ovarian cyst   . Pelvic pain   . Vaccine for human papilloma virus (HPV) types 6, 11, 16, and 18 administered     Family History: Family History  Problem Relation Age of Onset  . Diabetes Father     Social History  Socioeconomic History  . Marital status: Married    Spouse name: Not on file  . Number of children: Not on file  . Years of education: Not on file  . Highest education level: Not on file  Occupational History  . Not on file  Tobacco Use  . Smoking status: Former Smoker    Years: 1.00    Quit date: 10/14/2014    Years since quitting: 5.9  . Smokeless tobacco: Never Used  Vaping Use  . Vaping Use: Never used  Substance and Sexual Activity  . Alcohol use: Yes    Alcohol/week: 0.0 standard drinks    Comment:  occasionally  . Drug use: No  . Sexual activity: Yes    Birth control/protection: Inserts    Comment: Nuvaring  Other Topics Concern  . Not on file  Social History Narrative  . Not on file   Social Determinants of Health   Financial Resource Strain:   . Difficulty of Paying Living Expenses: Not on file  Food Insecurity:   . Worried About Programme researcher, broadcasting/film/video in the Last Year: Not on file  . Ran Out of Food in the Last Year: Not on file  Transportation Needs:   . Lack of Transportation (Medical): Not on file  . Lack of Transportation (Non-Medical): Not on file  Physical Activity:   . Days of Exercise per Week: Not on file  . Minutes of Exercise per Session: Not on file  Stress:   . Feeling of Stress : Not on file  Social Connections:   . Frequency of Communication with Friends and Family: Not on file  . Frequency of Social Gatherings with Friends and Family: Not on file  . Attends Religious Services: Not on file  . Active Member of Clubs or Organizations: Not on file  . Attends Banker Meetings: Not on file  . Marital Status: Not on file  Intimate Partner Violence:   . Fear of Current or Ex-Partner: Not on file  . Emotionally Abused: Not on file  . Physically Abused: Not on file  . Sexually Abused: Not on file      Review of Systems  Constitutional: Positive for fatigue. Negative for appetite change, chills and fever.  HENT: Positive for congestion, ear pain, postnasal drip, rhinorrhea, sinus pain, sore throat and voice change.   Respiratory: Positive for cough. Negative for wheezing.   Gastrointestinal: Negative for nausea and vomiting.  Musculoskeletal: Negative for arthralgias and myalgias.  Neurological: Positive for headaches.  Hematological: Positive for adenopathy.    Today's Vitals   09/13/20 1132  Temp: 98.6 F (37 C)  Weight: 235 lb (106.6 kg)  Height: 5\' 3"  (1.6 m)   Body mass index is 41.63 kg/m.  Observation/Objective:   The  patient is alert and oriented. She is pleasant and answers all questions appropriately. Breathing is non-labored. She is in no acute distress at this time. The patient is nasally congested and sounds hoarse. She has dey and harsh cough.    Assessment/Plan: 1. Acute upper respiratory infection Start ceftin 500mg  twice daily for 10 days. Add prednisone taper. Take as directed for 6 days. Rest and increase fluids. Continue to take OTC medications as needed and as indicated for acute symptoms.  - cefUROXime (CEFTIN) 500 MG tablet; Take 1 tablet (500 mg total) by mouth 2 (two) times daily with a meal.  Dispense: 20 tablet; Refill: 0 - predniSONE (STERAPRED UNI-PAK 21 TAB) 10 MG (21) TBPK tablet; 6 day taper -  take by mouth as directed for 6 days  Dispense: 21 tablet; Refill: 0  2. Cough New prescription for tussinex. May take up to twice daily as needed for cough.  Advised patient not to overuse this medicine and not to mix with other medications or alcohol as it can cause respiratory distress, sleepiness or dizziness. Should also avoid driving. Patient voiced understanding and agreement.  - chlorpheniramine-HYDROcodone (TUSSIONEX PENNKINETIC ER) 10-8 MG/5ML SUER; Take 5 mLs by mouth every 12 (twelve) hours as needed for cough.  Dispense: 115 mL; Refill: 0  General Counseling: Milessa verbalizes understanding of the findings of today's phone visit and agrees with plan of treatment. I have discussed any further diagnostic evaluation that may be needed or ordered today. We also reviewed her medications today. she has been encouraged to call the office with any questions or concerns that should arise related to todays visit.  Rest and increase fluids. Continue using OTC medication to control symptoms.   This patient was seen by Vincent Gros FNP Collaboration with Dr Lyndon Code as a part of collaborative care agreement  Meds ordered this encounter  Medications  . cefUROXime (CEFTIN) 500 MG tablet     Sig: Take 1 tablet (500 mg total) by mouth 2 (two) times daily with a meal.    Dispense:  20 tablet    Refill:  0    Order Specific Question:   Supervising Provider    Answer:   Lyndon Code [[id number]]  . predniSONE (STERAPRED UNI-PAK 21 TAB) 10 MG (21) TBPK tablet    Sig: 6 day taper - take by mouth as directed for 6 days    Dispense:  21 tablet    Refill:  0    Order Specific Question:   Supervising Provider    Answer:   Lyndon Code [[id number]]  . chlorpheniramine-HYDROcodone (TUSSIONEX PENNKINETIC ER) 10-8 MG/5ML SUER    Sig: Take 5 mLs by mouth every 12 (twelve) hours as needed for cough.    Dispense:  115 mL    Refill:  0    Order Specific Question:   Supervising Provider    Answer:   Lyndon Code [[id number]]    Time spent: 13 Minutes    Dr Lyndon Code Internal medicine

## 2020-09-18 ENCOUNTER — Encounter: Payer: Self-pay | Admitting: Obstetrics and Gynecology

## 2020-09-25 DIAGNOSIS — Z03818 Encounter for observation for suspected exposure to other biological agents ruled out: Secondary | ICD-10-CM | POA: Diagnosis not present

## 2020-10-14 NOTE — L&D Delivery Note (Signed)
       Delivery Note   [name] is a 30 y.o. G1P0000 at [redacted]w[redacted]d Estimated Date of Delivery: 06/28/21  PRE-OPERATIVE DIAGNOSIS:  1) [redacted]w[redacted]d pregnancy. Elevated BMI pregnancy   POST-OPERATIVE DIAGNOSIS:  1) [redacted]w[redacted]d pregnancy s/p Vaginal, Vacuum (Extractor)    Delivery Type: Vaginal, Vacuum (Extractor)    Delivery Anesthesia: Epidural   Labor Complications:   prolonged active stage    ESTIMATED BLOOD LOSS: 300 ml    FINDINGS:   1) female infant, Apgar scores of 7   at 1 minute and 9   at 5 minutes and a birthweight of 109.7  ounces.    2) Nuchal cord: No  SPECIMENS:   PLACENTA:   Appearance: Intact , 3 vessel cord , cord blood sample collected   Removal: Spontaneous      Disposition: per protocol    DISPOSITION:  Infant to left in stable condition in the delivery room, with L&D personnel and mother,  NARRATIVE SUMMARY: Labor course:  [name] is a G1P0000 at [redacted]w[redacted]d who presented for induction of labor.  She progressed well in labor with Cytotec, foley bulb,  and pitocin.  She received the appropriate epidural anesthesia and proceeded to complete dilation. She evidenced adequate maternal expulsive effort during the second stage. After 2.5 hrs of pushing. Dr. Logan Bores called for assistance with Vacuum delivery. Kiwi Vacuum was placed at plus 3 station. Pt pushed with 3 contractions while tension applied to kiwi. No pop offs occurred. She went on to deliver a viable  female infant " Hannah Stevens ". The placenta delivered without problems and was noted to be complete. A perineal and vaginal examination was performed. Episiotomy/Lacerations: 1st degree , bilateral periurethral.   The 1st degree laceration was repaired with 3-0 Vicryl Rapide suture . The periurethral abrasions were hemostatic and not in need of repair. . The patient tolerated this well.  Doreene Burke, CNM  06/26/2021 4:18 PM

## 2020-10-16 DIAGNOSIS — Z8616 Personal history of COVID-19: Secondary | ICD-10-CM | POA: Insufficient documentation

## 2020-10-23 ENCOUNTER — Telehealth: Payer: Self-pay

## 2020-10-23 ENCOUNTER — Encounter: Payer: Self-pay | Admitting: Surgical

## 2020-10-23 NOTE — Telephone Encounter (Signed)
LM on patients voicemail of this message. I have also sent a my chart message to the patient.

## 2020-10-23 NOTE — Telephone Encounter (Signed)
Patient called in stating that she

## 2020-10-23 NOTE — Telephone Encounter (Signed)
Please contact patient. Send safe pregnancy medication list via MyChart. Monitor symptoms and rest at home. If symptoms worsen, then reach out to discuss MAB infusion (within 7 days, supply is low). Review red flag symptoms and when to call. Keep appointment as scheduled. Thanks, JML

## 2020-10-23 NOTE — Telephone Encounter (Signed)
Patient called in stating that she recently had tested positive for COVID and was wanting to know if there is anything she needs to worry about as she is pregnant. Patient is a new patient and is scheduled to see you. Could you please advise?

## 2020-11-02 NOTE — Patient Instructions (Addendum)
WHAT OB PATIENTS CAN EXPECT   Confirmation of pregnancy and ultrasound ordered if medically indicated-[redacted] weeks gestation  New OB (NOB) intake with nurse and New OB (NOB) labs- [redacted] weeks gestation  New OB (NOB) physical examination with provider- 11/[redacted] weeks gestation  Flu vaccine-[redacted] weeks gestation  Anatomy scan-[redacted] weeks gestation  Glucose tolerance test, blood work to test for anemia, T-dap vaccine-[redacted] weeks gestation  Vaginal swabs/cultures-STD/Group B strep-[redacted] weeks gestation  Appointments every 4 weeks until 28 weeks  Every 2 weeks from 28 weeks until 36 weeks  Weekly visits from 36 weeks until delivery  Morning Sickness  Morning sickness is when you feel like you may vomit (feel nauseous) during pregnancy. Sometimes, you may vomit. Morning sickness most often happens in the morning, but it can also happen at any time of the day. Some women may have morning sickness that makes them vomit all the time. This is a more serious problem that needs treatment. What are the causes? The cause of this condition is not known. What increases the risk?  You had vomiting or a feeling like you may vomit before your pregnancy.  You had morning sickness in another pregnancy.  You are pregnant with more than one baby, such as twins. What are the signs or symptoms?  Feeling like you may vomit.  Vomiting. How is this treated? Treatment is usually not needed for this condition. You may only need to change what you eat. In some cases, your doctor may give you some things to take for your condition. These include:  Vitamin B6 supplements.  Medicines to treat the feeling that you may vomit.  Ginger. Follow these instructions at home: Medicines  Take over-the-counter and prescription medicines only as told by your doctor. Do not take any medicines until you talk with your doctor about them first.  Take multivitamins before you get pregnant. These can stop or lessen the symptoms of morning  sickness. Eating and drinking  Eat dry toast or crackers before getting out of bed.  Eat 5 or 6 small meals a day.  Eat dry and bland foods like rice and baked potatoes.  Do not eat greasy, fatty, or spicy foods.  Have someone cook for you if the smell of food causes you to vomit or to feel like you may vomit.  If you feel like you may vomit after taking prenatal vitamins, take them at night or with a snack.  Eat protein foods when you need a snack. Nuts, yogurt, and cheese are good choices.  Drink fluids throughout the day.  Try ginger ale made with real ginger, ginger tea made from fresh grated ginger, or ginger candies. General instructions  Do not smoke or use any products that contain nicotine or tobacco. If you need help quitting, ask your doctor.  Use an air purifier to keep the air in your house free of smells.  Get lots of fresh air.  Try to avoid smells that make you feel sick.  Try wearing an acupressure wristband. This is a wristband that is used to treat seasickness.  Try a treatment called acupuncture. In this treatment, a doctor puts needles into certain areas of your body to make you feel better. Contact a doctor if:  You need medicine to feel better.  You feel dizzy or light-headed.  You are losing weight. Get help right away if:  The feeling that you may vomit will not go away, or you cannot stop vomiting.  You faint.  You have  very bad pain in your belly. Summary  Morning sickness is when you feel like you may vomit (feel nauseous) during pregnancy.  You may feel sick in the morning, but you can feel this way at any time of the day.  Making some changes to what you eat may help your symptoms go away. This information is not intended to replace advice given to you by your health care provider. Make sure you discuss any questions you have with your health care provider. Document Revised: 05/15/2020 Document Reviewed: 04/24/2020 Elsevier Patient  Education  2021 [employer]. AboveDiscount.com.cy.html">  First Trimester of Pregnancy  The first trimester of pregnancy starts on the first day of your last menstrual period until the end of week 12. This is also called months 1 through 3 of pregnancy. Body changes during your first trimester Your body goes through many changes during pregnancy. The changes usually return to normal after your baby is born. Physical changes  You may gain or lose weight.  Your breasts may grow larger and hurt. The area around your nipples may get darker.  Dark spots or blotches may develop on your face.  You may have changes in your hair. Health changes  You may feel like you might vomit (nauseous), and you may vomit.  You may have heartburn.  You may have headaches.  You may have trouble pooping (constipation).  Your gums may bleed. Other changes  You may get tired easily.  You may pee (urinate) more often.  Your menstrual periods will stop.  You may not feel hungry.  You may want to eat certain kinds of food.  You may have changes in your emotions from day to day.  You may have more dreams. Follow these instructions at home: Medicines  Take over-the-counter and prescription medicines only as told by your doctor. Some medicines are not safe during pregnancy.  Take a prenatal vitamin that contains at least 600 micrograms (mcg) of folic acid. Eating and drinking  Eat healthy meals that include: ? Fresh fruits and vegetables. ? Whole grains. ? Good sources of protein, such as meat, eggs, or tofu. ? Low-fat dairy products.  Avoid raw meat and unpasteurized juice, milk, and cheese.  If you feel like you may vomit, or you vomit: ? Eat 4 or 5 small meals a day instead of 3 large meals. ? Try eating a few soda crackers. ? Drink liquids between meals instead of during meals.  You may need to take these actions to prevent or treat trouble pooping: ? Drink  enough fluids to keep your pee (urine) pale yellow. ? Eat foods that are high in fiber. These include beans, whole grains, and fresh fruits and vegetables. ? Limit foods that are high in fat and sugar. These include fried or sweet foods. Activity  Exercise only as told by your doctor. Most people can do their usual exercise routine during pregnancy.  Stop exercising if you have cramps or pain in your lower belly (abdomen) or low back.  Do not exercise if it is too hot or too humid, or if you are in a place of great height (high altitude).  Avoid heavy lifting.  If you choose to, you may have sex unless your doctor tells you not to. Relieving pain and discomfort  Wear a good support bra if your breasts are sore.  Rest with your legs raised (elevated) if you have leg cramps or low back pain.  If you have bulging veins (varicose veins) in  your legs: ? Wear support hose as told by your doctor. ? Raise your feet for 15 minutes, 3-4 times a day. ? Limit salt in your food. Safety  Wear your seat belt at all times when you are in a car.  Talk with your doctor if someone is hurting you or yelling at you.  Talk with your doctor if you are feeling sad or have thoughts of hurting yourself. Lifestyle  Do not use hot tubs, steam rooms, or saunas.  Do not douche. Do not use tampons or scented sanitary pads.  Do not use herbal medicines, illegal drugs, or medicines that are not approved by your doctor. Do not drink alcohol.  Do not smoke or use any products that contain nicotine or tobacco. If you need help quitting, ask your doctor.  Avoid cat litter boxes and soil that is used by cats. These carry germs that can cause harm to the baby and can cause a loss of your baby by miscarriage or stillbirth. General instructions  Keep all follow-up visits. This is important.  Ask for help if you need counseling or if you need help with nutrition. Your doctor can give you advice or tell you where  to go for help.  Visit your dentist. At home, brush your teeth with a soft toothbrush. Floss gently.  Write down your questions. Take them to your prenatal visits. Where to find more information  American Pregnancy Association: americanpregnancy.org  [employer]necologists: www.acog.org  Office on Women's Health: KeywordPortfolios.com.br Contact a doctor if:  You are dizzy.  You have a fever.  You have mild cramps or pressure in your lower belly.  You have a nagging pain in your belly area.  You continue to feel like you may vomit, you vomit, or you have watery poop (diarrhea) for 24 hours or longer.  You have a bad-smelling fluid coming from your vagina.  You have pain when you pee.  You are exposed to a disease that spreads from person to person, such as chickenpox, measles, Zika virus, HIV, or hepatitis. Get help right away if:  You have spotting or bleeding from your vagina.  You have very bad belly cramping or pain.  You have shortness of breath or chest pain.  You have any kind of injury, such as from a fall or a car crash.  You have new or increased pain, swelling, or redness in an arm or leg. Summary  The first trimester of pregnancy starts on the first day of your last menstrual period until the end of week 12 (months 1 through 3).  Eat 4 or 5 small meals a day instead of 3 large meals.  Do not smoke or use any products that contain nicotine or tobacco. If you need help quitting, ask your doctor.  Keep all follow-up visits. This information is not intended to replace advice given to you by your health care provider. Make sure you discuss any questions you have with your health care provider. Document Revised: 03/08/2020 Document Reviewed: 01/13/2020 Elsevier Patient Education  [place]. Common Medications Safe in Pregnancy  Acne:      Constipation:  Benzoyl Peroxide     Colace  Clindamycin      Dulcolax  Suppository  Topica Erythromycin     Fibercon  Salicylic Acid      Metamucil         Miralax AVOID:        Senakot   Accutane  Cough:  Retin-A       Cough Drops  Tetracycline      Phenergan w/ Codeine if Rx  Minocycline      Robitussin (Plain & DM)  Antibiotics:     Crabs/Lice:  Ceclor       RID  Cephalosporins    AVOID:  E-Mycins      Kwell  Keflex  Macrobid/Macrodantin   Diarrhea:  Penicillin      Kao-Pectate  Zithromax      Imodium AD         PUSH FLUIDS AVOID:       Cipro     Fever:  Tetracycline      Tylenol (Regular or Extra  Minocycline       Strength)  Levaquin      Extra Strength-Do not          Exceed 8 tabs/24 hrs Caffeine:        <240m/day (equiv. To 1 cup of coffee or  approx. 3 12 oz sodas)         Gas: Cold/Hayfever:       Gas-X  Benadryl      Mylicon  Claritin       Phazyme  **Claritin-D        Chlor-Trimeton    Headaches:  Dimetapp      ASA-Free Excedrin  Drixoral-Non-Drowsy     Cold Compress  Mucinex (Guaifenasin)     Tylenol (Regular or Extra  Sudafed/Sudafed-12 Hour     Strength)  **Sudafed PE Pseudoephedrine   Tylenol Cold & Sinus     Vicks Vapor Rub  Zyrtec  **AVOID if Problems With Blood Pressure         Heartburn: Avoid lying down for at least 1 hour after meals  Aciphex      Maalox     Rash:  Milk of Magnesia     Benadryl    Mylanta       1% Hydrocortisone Cream  Pepcid  Pepcid Complete   Sleep Aids:  Prevacid      Ambien   Prilosec       Benadryl  Rolaids       Chamomile Tea  Tums (Limit 4/day)     Unisom         Tylenol PM         Warm milk-add vanilla or  Hemorrhoids:       Sugar for taste  Anusol/Anusol H.C.  (RX: Analapram 2.5%)  Sugar Substitutes:  Hydrocortisone OTC     Ok in moderation  Preparation H      Tucks        Vaseline lotion applied to tissue with wiping    Herpes:     Throat:  Acyclovir      Oragel  Famvir  Valtrex     Vaccines:         Flu Shot Leg  Cramps:       *Gardasil  Benadryl      Hepatitis A         Hepatitis B Nasal Spray:       Pneumovax  Saline Nasal Spray     Polio Booster         Tetanus Nausea:       Tuberculosis test or PPD  Vitamin B6 25 mg TID   AVOID:    Dramamine      *Gardasil  Emetrol       Live Poliovirus  Ginger Root 250 mg  QID    MMR (measles, mumps &  High Complex Carbs @ Bedtime    rebella)  Sea Bands-Accupressure    Varicella (Chickenpox)  Unisom 1/2 tab TID     *No known complications           If received before Pain:         Known pregnancy;   Darvocet       Resume series after  Lortab        Delivery  Percocet    Yeast:   Tramadol      Femstat  Tylenol 3      Gyne-lotrimin  Ultram       Monistat  Vicodin           MISC:         All Sunscreens           Hair Coloring/highlights          Insect Repellant's          (Including DEET)         Mystic Tans

## 2020-11-03 ENCOUNTER — Other Ambulatory Visit: Payer: Self-pay

## 2020-11-03 ENCOUNTER — Ambulatory Visit (INDEPENDENT_AMBULATORY_CARE_PROVIDER_SITE_OTHER): Payer: BC Managed Care – PPO | Admitting: Certified Nurse Midwife

## 2020-11-03 ENCOUNTER — Encounter: Payer: Self-pay | Admitting: Certified Nurse Midwife

## 2020-11-03 VITALS — BP 99/66 | HR 84 | Ht 63.0 in | Wt 258.4 lb

## 2020-11-03 DIAGNOSIS — Z3201 Encounter for pregnancy test, result positive: Secondary | ICD-10-CM

## 2020-11-03 DIAGNOSIS — N926 Irregular menstruation, unspecified: Secondary | ICD-10-CM | POA: Diagnosis not present

## 2020-11-03 DIAGNOSIS — Z8616 Personal history of COVID-19: Secondary | ICD-10-CM | POA: Diagnosis not present

## 2020-11-03 LAB — POCT URINE PREGNANCY: Preg Test, Ur: POSITIVE — AB

## 2020-11-03 NOTE — Progress Notes (Signed)
I have seen, interviewed, and examined the patient in conjunction with the [employer] and affirm the diagnosis and management plan.   Gunnar Bulla, CNM [facility], [facility] 11/03/20 3:42 PM

## 2020-11-03 NOTE — Progress Notes (Signed)
GYN ENCOUNTER NOTE  Subjective:       Hannah Stevens is a 30 y.o. G63P0000 female is here for pregnancy confirmation.  Reports breast tenderness, 3. Mild nausea without vomiting-worse with prenatal, and occasional cramping.   No longer has COVID symptoms.   Denies breathing difficulty, respiratory distress, chest pain, vaginal bleeding, and leg swelling or pain.  Gynecologic History  Patient's last menstrual period was 09/22/2020  Estimated date of birth: 06/29/2021  Gestational age: 22 weeks 0 days  Contraception: none   Last Pap: 06/2018. Results were: normal  Obstetric History  OB History  Gravida Para Term Preterm AB Living  1 0 0 0 0 0  SAB IAB Ectopic Multiple Live Births  0 0 0 0      # Outcome Date GA Lbr Len/2nd Weight Sex Delivery Anes PTL Lv  1 Current             Obstetric Comments  1st Menstrual Cycle: 9   1st Pregnancy: N/A    Past Medical History:  Diagnosis Date  . Anxiety   . Ovarian cyst   . Pelvic pain   . Vaccine for human papilloma virus (HPV) types 6, 11, 16, and 18 administered     Past Surgical History:  Procedure Laterality Date  . TONSILLECTOMY    . WISDOM TOOTH EXTRACTION      Current Outpatient Medications on File Prior to Visit  Medication Sig Dispense Refill  . FIBER ADULT GUMMIES PO Take by mouth.    . Prenatal Multivit-Min-Fe-FA (PRE-NATAL PO) Take by mouth daily.      No current facility-administered medications on file prior to visit.    No Known Allergies  Social History   Socioeconomic History  . Marital status: Married    Spouse name: Not on file  . Number of children: Not on file  . Years of education: Not on file  . Highest education level: Not on file  Occupational History  . Not on file  Tobacco Use  . Smoking status: Former Smoker    Years: 1.00    Quit date: 10/14/2014    Years since quitting: 6.0  . Smokeless tobacco: Never Used  Vaping Use  . Vaping Use: Never used  Substance and Sexual Activity   . Alcohol use: Not Currently    Alcohol/week: 0.0 standard drinks    Comment: occasionally  . Drug use: No  . Sexual activity: Yes  Other Topics Concern  . Not on file  Social History Narrative  . Not on file   Social Determinants of Health   Financial Resource Strain: Not on file  Food Insecurity: Not on file  Transportation Needs: Not on file  Physical Activity: Not on file  Stress: Not on file  Social Connections: Not on file  Intimate Partner Violence: Not on file    Family History  Problem Relation Age of Onset  . Healthy Mother   . Diabetes Father     The following portions of the patient's history were reviewed and updated as appropriate: allergies, current medications, past family history, past medical history, past social history, past surgical history and problem list.  Review of Systems  ROS- Negative other than what was reported above. Information obtained verbally from patient.  Objective:   BP 99/66   Pulse 84   Ht 5\' 3"  (1.6 m)   Wt 117.2 kg   LMP 09/26/2020   BMI 45.77 kg/m    CONSTITUTIONAL: Well-developed, well-nourished female in no acute  distress.   PHYSICAL EXAM: Not indicated.   Recent Results (from the past 2160 hour(s))  POCT urine pregnancy     Status: Abnormal   Collection Time: 11/03/20  1:39 PM  Result Value Ref Range   Preg Test, Ur Positive (A) Negative    Assessment:   1. Missed menses - POCT urine pregnancy   2. History of COVID-19   3. Positive urine pregnancy test  Plan:   Advised patient to take prenatal vitamins at night before bed.  First trimester education, see AVS.   Reviewed red flag symptoms and when to call.  RTC x 2-3 weeks for dating and viability ultrasound and nurse intake.  RTC x 6-7 weeks for New OB PE or sooner if needed.   Juliann Pares, Student-MidWife [employer] 11/03/20 2:18 PM

## 2020-11-03 NOTE — Progress Notes (Addendum)
Pt present for pregnancy confirmation. Pt's last cycle 09/22/2020. UPT-POSITIVE. Safe medication list and other prenatal information placed in patients chart. Pt is aware.

## 2020-11-22 ENCOUNTER — Other Ambulatory Visit: Payer: BC Managed Care – PPO

## 2020-11-23 ENCOUNTER — Ambulatory Visit (INDEPENDENT_AMBULATORY_CARE_PROVIDER_SITE_OTHER): Payer: BC Managed Care – PPO

## 2020-11-23 ENCOUNTER — Other Ambulatory Visit: Payer: BC Managed Care – PPO

## 2020-11-23 ENCOUNTER — Ambulatory Visit (INDEPENDENT_AMBULATORY_CARE_PROVIDER_SITE_OTHER): Payer: BC Managed Care – PPO | Admitting: Certified Nurse Midwife

## 2020-11-23 ENCOUNTER — Other Ambulatory Visit: Payer: Self-pay

## 2020-11-23 VITALS — BP 113/66 | HR 72 | Ht 63.0 in | Wt 260.6 lb

## 2020-11-23 DIAGNOSIS — N926 Irregular menstruation, unspecified: Secondary | ICD-10-CM | POA: Diagnosis not present

## 2020-11-23 DIAGNOSIS — Z3201 Encounter for pregnancy test, result positive: Secondary | ICD-10-CM | POA: Diagnosis not present

## 2020-11-23 DIAGNOSIS — Z113 Encounter for screening for infections with a predominantly sexual mode of transmission: Secondary | ICD-10-CM | POA: Diagnosis not present

## 2020-11-23 DIAGNOSIS — Z3401 Encounter for supervision of normal first pregnancy, first trimester: Secondary | ICD-10-CM

## 2020-11-23 DIAGNOSIS — R638 Other symptoms and signs concerning food and fluid intake: Secondary | ICD-10-CM | POA: Diagnosis not present

## 2020-11-23 DIAGNOSIS — Z0283 Encounter for blood-alcohol and blood-drug test: Secondary | ICD-10-CM | POA: Diagnosis not present

## 2020-11-23 NOTE — Progress Notes (Signed)
I have reviewed the record and concur with patient management and plan of care.    Serafina Royals, CNM [facility], [facility] 11/23/20 3:03 PM

## 2020-11-23 NOTE — Progress Notes (Signed)
Hannah Stevens presents for NOB nurse intake visit. Pregnancy confirmation done at [facility], 11/03/2020, with Jenetta Loges, CNM.  G 1.  P 0.  LMP 09/22/2020.  EDD 06/29/2021.  Ga [redacted]w[redacted]d. Pregnancy education material explained and given.  0 cats in the home.  NOB labs ordered. BMI greater than 30. TSH/HbgA1c ordered. Sickle cell not ordered due to race. HIV and drug screen explained and ordered. Genetic screening discussed. Genetic testing; Pt would like to get Natera. Ordered for Natera not done during nurse intake due to pt's BMI.  Pt to discuss genetic testing with provider. PNV encouraged. Pt to follow up with provider in 4 weeks for NOB physical.  FMLA;Drug/ HIV screening; [facility] finanical policy reviewed and signed by patient.

## 2020-11-23 NOTE — Patient Instructions (Signed)
WHAT OB PATIENTS CAN EXPECT   Confirmation of pregnancy and ultrasound ordered if medically indicated-[redacted] weeks gestation  New OB (NOB) intake with nurse and New OB (NOB) labs- [redacted] weeks gestation  New OB (NOB) physical examination with provider- 11/[redacted] weeks gestation  Flu vaccine-[redacted] weeks gestation  Anatomy scan-[redacted] weeks gestation  Glucose tolerance test, blood work to test for anemia, T-dap vaccine-[redacted] weeks gestation  Vaginal swabs/cultures-STD/Group B strep-[redacted] weeks gestation  Appointments every 4 weeks until 28 weeks  Every 2 weeks from 28 weeks until 36 weeks  Weekly visits from 36 weeks until delivery  [email or web address]       Morning Sickness  Morning sickness is when you feel like you may vomit (feel nauseous) during pregnancy. Sometimes, you may vomit. Morning sickness most often happens in the morning, but it can also happen at any time of the day. Some women may have morning sickness that makes them vomit all the time. This is a more serious problem that needs treatment. What are the causes? The cause of this condition is not known. What increases the risk?  You had vomiting or a feeling like you may vomit before your pregnancy.  You had morning sickness in another pregnancy.  You are pregnant with more than one baby, such as twins. What are the signs or symptoms?  Feeling like you may vomit.  Vomiting. How is this treated? Treatment is usually not needed for this condition. You may only need to change what you eat. In some cases, your doctor may give you some things to take for your condition. These include:  Vitamin B6 supplements.  Medicines to treat the feeling that you may vomit.  Ginger. Follow these instructions at home: Medicines  Take over-the-counter and prescription medicines only as told by your doctor. Do not take any medicines until you talk with your doctor about them first.  Take  multivitamins before you get pregnant. These can stop or lessen the symptoms of morning sickness. Eating and drinking  Eat dry toast or crackers before getting out of bed.  Eat 5 or 6 small meals a day.  Eat dry and bland foods like rice and baked potatoes.  Do not eat greasy, fatty, or spicy foods.  Have someone cook for you if the smell of food causes you to vomit or to feel like you may vomit.  If you feel like you may vomit after taking prenatal vitamins, take them at night or with a snack.  Eat protein foods when you need a snack. Nuts, yogurt, and cheese are good choices.  Drink fluids throughout the day.  Try ginger ale made with real ginger, ginger tea made from fresh grated ginger, or ginger candies. General instructions  Do not smoke or use any products that contain nicotine or tobacco. If you need help quitting, ask your doctor.  Use an air purifier to keep the air in your house free of smells.  Get lots of fresh air.  Try to avoid smells that make you feel sick.  Try wearing an acupressure wristband. This is a wristband that is used to treat seasickness.  Try a treatment called acupuncture. In this treatment, a doctor puts needles into certain areas of your body to make you feel better. Contact a doctor if:  You need medicine to feel better.  You feel dizzy or light-headed.  You are losing weight. Get help right away if:  The feeling that you may vomit will not go away, or you cannot stop  vomiting.  You faint.  You have very bad pain in your belly. Summary  Morning sickness is when you feel like you may vomit (feel nauseous) during pregnancy.  You may feel sick in the morning, but you can feel this way at any time of the day.  Making some changes to what you eat may help your symptoms go away. This information is not intended to replace advice given to you by your health care provider. Make sure you discuss any questions you have with your health care  provider. Document Revised: 05/15/2020 Document Reviewed: 04/24/2020 Elsevier Patient Education  2021 [place].  Prenatal Care Prenatal care is health care during pregnancy. It helps you and your unborn baby (fetus) stay as healthy as possible. Prenatal care may be provided by a midwife, a family practice doctor, a IT consultant (nurse practitioner or physician assistant), or a childbirth and pregnancy doctor (obstetrician). How does this affect me? During pregnancy, you will be closely monitored for any new conditions that might develop. To lower your risk of pregnancy complications, you and your health care provider will talk about any underlying conditions you have. How does this affect my baby? Early and consistent prenatal care increases the chance that your baby will be healthy during pregnancy. Prenatal care lowers the risk that your baby will be:  Born early (prematurely).  Smaller than expected at birth (small for gestational age). What can I expect at the first prenatal care visit? Your first prenatal care visit will likely be the longest. You should schedule your first prenatal care visit as soon as you know that you are pregnant. Your first visit is a good time to talk about any questions or concerns you have about pregnancy. Medical history At your visit, you and your health care provider will talk about your medical history, including:  Any past pregnancies.  Your family's medical history.  Medical history of the baby's father.  Any long-term (chronic) health conditions you have and how you manage them.  Any surgeries or procedures you have had.  Any current over-the-counter or prescription medicines, herbs, or supplements that you are taking.  Other factors that could pose a risk to your baby, including: ? Exposure to harmful chemicals or radiation at work or at home. ? Any substance use, including tobacco, alcohol, and drug use.  Your home setting and  your stress levels, including: ? Exposure to abuse or violence. ? Household financial strain.  Your daily health habits, including diet and exercise. Tests and screenings Your health care provider will:  Measure your weight, height, and blood pressure.  Do a physical exam, including a pelvic and breast exam.  Perform blood tests and urine tests to check for: ? Urinary tract infection. ? Sexually transmitted infections (STIs). ? Low iron levels in your blood (anemia). ? Blood type and certain proteins on red blood cells (Rh antibodies). ? Infections and immunity to viruses, such as hepatitis B and rubella. ? HIV (human immunodeficiency virus).  Discuss your options for genetic screening. Tips about staying healthy Your health care provider will also give you information about how to keep yourself and your baby healthy, including:  Nutrition and taking vitamins.  Physical activity.  How to manage pregnancy symptoms such as nausea and vomiting (morning sickness).  Infections and substances that may be harmful to your baby and how to avoid them.  Food safety.  Dental care.  Working.  Travel.  Warning signs to watch for and when to call your  health care provider. How often will I have prenatal care visits? After your first prenatal care visit, you will have regular visits throughout your pregnancy. The visit schedule is often as follows:  Up to week 28 of pregnancy: once every 4 weeks.  28-36 weeks: once every 2 weeks.  After 36 weeks: every week until delivery. Some women may have visits more or less often depending on any underlying health conditions and the health of the baby. Keep all follow-up and prenatal care visits. This is important. What happens during routine prenatal care visits? Your health care provider will:  Measure your weight and blood pressure.  Check for fetal heart sounds.  Measure the height of your uterus in your abdomen (fundal height).  This may be measured starting around week 20 of pregnancy.  Check the position of your baby inside your uterus.  Ask questions about your diet, sleeping patterns, and whether you can feel the baby move.  Review warning signs to watch for and signs of labor.  Ask about any pregnancy symptoms you are having and how you are dealing with them. Symptoms may include: ? Headaches. ? Nausea and vomiting. ? Vaginal discharge. ? Swelling. ? Fatigue. ? Constipation. ? Changes in your vision. ? Feeling persistently sad or anxious. ? Any discomfort, including back or pelvic pain. ? Bleeding or spotting. Make a list of questions to ask your health care provider at your routine visits.   What tests might I have during prenatal care visits? You may have blood, urine, and imaging tests throughout your pregnancy, such as:  Urine tests to check for glucose, protein, or signs of infection.  Glucose tests to check for a form of diabetes that can develop during pregnancy (gestational diabetes mellitus). This is usually done around week 24 of pregnancy.  Ultrasounds to check your baby's growth and development, to check for birth defects, and to check your baby's well-being. These can also help to decide when you should deliver your baby.  A test to check for group B strep (GBS) infection. This is usually done around week 36 of pregnancy.  Genetic testing. This may include blood, fluid, or tissue sampling, or imaging tests, such as an ultrasound. Some genetic tests are done during the first trimester and some are done during the second trimester. What else can I expect during prenatal care visits? Your health care provider may recommend getting certain vaccines during pregnancy. These may include:  A yearly flu shot (annual influenza vaccine). This is especially important if you will be pregnant during flu season.  Tdap (tetanus, diphtheria, pertussis) vaccine. Getting this vaccine during pregnancy can  protect your baby from whooping cough (pertussis) after birth. This vaccine may be recommended between weeks 27 and 36 of pregnancy.  A COVID-19 vaccine. Later in your pregnancy, your health care provider may give you information about:  Childbirth and breastfeeding classes.  Choosing a health care provider for your baby.  Umbilical cord banking.  Breastfeeding.  Birth control after your baby is born.  The hospital labor and delivery unit and how to set up a tour.  Registering at the hospital before you go into labor. Where to find more information  Office on Women's Health: TravelLesson.ca  American Pregnancy Association: americanpregnancy.org  March of Dimes: marchofdimes.org Summary  Prenatal care helps you and your baby stay as healthy as possible during pregnancy.  Your first prenatal care visit will most likely be the longest.  You will have visits and tests throughout your  pregnancy to monitor your health and your baby's health.  Bring a list of questions to your visits to ask your health care provider.  Make sure to keep all follow-up and prenatal care visits. This information is not intended to replace advice given to you by your health care provider. Make sure you discuss any questions you have with your health care provider. Document Revised: 07/13/2020 Document Reviewed: 07/13/2020 Elsevier Patient Education  2021 [place]. How a Baby Grows During Pregnancy Pregnancy begins when a female's sperm enters a female's egg. This is called fertilization. Fertilization usually happens in one of the fallopian tubes that connect the ovaries to the uterus. The fertilized egg moves down the fallopian tube to the uterus. Once it reaches the uterus, it implants into the lining of the uterus and begins to grow. For the first 8 weeks, the fertilized egg is called an embryo. After 8 weeks, it is called a fetus. As the fetus continues to grow, it receives oxygen and nutrients  through the placenta, which is an organ that grows to support the developing baby. The placenta is the life support system for the baby. It provides oxygen and nutrition and removes waste. How long does a typical pregnancy last? A pregnancy usually lasts 280 days, or about 40 weeks. Pregnancy is divided into three periods of growth, also called trimesters:  First trimester: 0-12 weeks.  Second trimester: 13-27 weeks.  Third trimester: 28-40 weeks. The day when your baby is ready to be born (full term) is your estimated date of delivery. However, most babies are not born on their estimated date of delivery. How does my baby develop month by month? First month  The fertilized egg attaches to the inside of the uterus.  Some cells will form the placenta. Others will form the fetus.  The arms, legs, brain, spinal cord, lungs, and heart begin to develop.  At the end of the first month, the heart begins to beat. Second month  The bones, inner ear, eyelids, hands, and feet form.  The genitals develop.  By the end of 8 weeks, all major organs are developing. Third month  All of the internal organs are forming.  Teeth develop below the gums.  Bones and muscles begin to grow. The spine can flex.  The skin is transparent.  Fingernails and toenails begin to form.  Arms and legs continue to grow longer, and hands and feet develop.  The fetus is about 3 inches (7.6 cm) long. Fourth month  The placenta is completely formed.  The external sex organs, neck, outer ear, eyebrows, eyelids, and fingernails are formed.  The fetus can hear, swallow, and move its arms and legs.  The kidneys begin to produce urine.  The skin is covered with a white, waxy coating (vernix) and very fine hair (lanugo). Fifth month  The fetus moves around more and can be felt for the first time (quickening).  The fetus starts to sleep and wake up and may begin to suck a finger.  The nails grow to the end  of the fingers.  The organ in the digestive system that makes bile (gallbladder) functions and helps to digest nutrients.  If the fetus is a female, eggs are present in the ovaries. If the fetus is a female, testicles start to move down into the scrotum. Sixth month  The lungs are formed.  The eyes open. The brain continues to develop.  Your baby has fingerprints and toe prints. Your baby's hair grows  thicker.  At the end of the second trimester, the fetus is about 9 inches (22.9 cm) long. Seventh month  The fetus kicks and stretches.  The eyes are developed enough to sense changes in light.  The hands can make a grasping motion.  The fetus responds to sound. Eighth month  Most organs and body systems are fully developed and functioning.  Bones harden, and taste buds develop. The fetus may hiccup.  Certain areas of the brain are still developing. The skull remains soft. Ninth month  The fetus gains about  lb (0.23 kg) each week.  The lungs are fully developed.  Patterns of sleep develop.  The fetus's head typically moves into a head-down position (vertex) in the uterus to prepare for birth.  The fetus weighs 6-9 lb (2.72-4.08 kg) and is 19-20 inches (48.26-50.8 cm) long.   How do I know if my baby is developing well? Always talk with your health care provider about any concerns that you may have about your pregnancy and your baby. At each prenatal visit, your health care provider will do several different tests to check on your health and keep track of your baby's development. These include:  Fundal height and position. To do this, your health care provider will: ? Measure your growing belly from your pubic bone to the top of the uterus using a tape measure. ? Feel your belly to determine your baby's position.  Heartbeat. An ultrasound in the first trimester can confirm pregnancy and show a heartbeat, depending on how far along you are. Your health care provider will  check your baby's heart rate at every prenatal visit. You will also have a second trimester ultrasound to check your baby's development. Follow these instructions at home:  Take prenatal vitamins as told by your health care provider. These include vitamins such as folic acid, iron, calcium, and vitamin D. They are important for healthy development.  Take over-the-counter and prescription medicines only as told by your health care provider.  Keep all follow-up visits. This is important. Follow-up visits include prenatal care and screening tests. Summary  A pregnancy usually lasts 280 days, or about 40 weeks. Pregnancy is divided into three periods of growth, also called trimesters.  Your health care provider will monitor your baby's growth and development throughout your pregnancy.  Follow your health care provider's recommendations about taking prenatal vitamins and medicines during your pregnancy.  Talk with your health care provider if you have any concerns about your pregnancy or your developing baby. This information is not intended to replace advice given to you by your health care provider. Make sure you discuss any questions you have with your health care provider. Document Revised: 03/08/2020 Document Reviewed: 01/13/2020 Elsevier Patient Education  2021 [employer]. [email or web address]">  First Trimester of Pregnancy  The first trimester of pregnancy starts on the first day of your last menstrual period until the end of week 12. This is also called months 1 through 3 of pregnancy. Body changes during your first trimester Your body goes through many changes during pregnancy. The changes usually return to normal after your baby is born. Physical changes  You may gain or lose weight.  Your breasts may grow larger and hurt. The area around your nipples may get darker.  Dark spots or blotches may develop on your face.  You may have changes in your  hair. Health changes  You may feel like you might vomit (nauseous), and you may vomit.  You may have  heartburn.  You may have headaches.  You may have trouble pooping (constipation).  Your gums may bleed. Other changes  You may get tired easily.  You may pee (urinate) more often.  Your menstrual periods will stop.  You may not feel hungry.  You may want to eat certain kinds of food.  You may have changes in your emotions from day to day.  You may have more dreams. Follow these instructions at home: Medicines  Take over-the-counter and prescription medicines only as told by your doctor. Some medicines are not safe during pregnancy.  Take a prenatal vitamin that contains at least 600 micrograms (mcg) of folic acid. Eating and drinking  Eat healthy meals that include: ? Fresh fruits and vegetables. ? Whole grains. ? Good sources of protein, such as meat, eggs, or tofu. ? Low-fat dairy products.  Avoid raw meat and unpasteurized juice, milk, and cheese.  If you feel like you may vomit, or you vomit: ? Eat 4 or 5 small meals a day instead of 3 large meals. ? Try eating a few soda crackers. ? Drink liquids between meals instead of during meals.  You may need to take these actions to prevent or treat trouble pooping: ? Drink enough fluids to keep your pee (urine) pale yellow. ? Eat foods that are high in fiber. These include beans, whole grains, and fresh fruits and vegetables. ? Limit foods that are high in fat and sugar. These include fried or sweet foods. Activity  Exercise only as told by your doctor. Most people can do their usual exercise routine during pregnancy.  Stop exercising if you have cramps or pain in your lower belly (abdomen) or low back.  Do not exercise if it is too hot or too humid, or if you are in a place of great height (high altitude).  Avoid heavy lifting.  If you choose to, you may have sex unless your doctor tells you not  to. Relieving pain and discomfort  Wear a good support bra if your breasts are sore.  Rest with your legs raised (elevated) if you have leg cramps or low back pain.  If you have bulging veins (varicose veins) in your legs: ? Wear support hose as told by your doctor. ? Raise your feet for 15 minutes, 3-4 times a day. ? Limit salt in your food. Safety  Wear your seat belt at all times when you are in a car.  Talk with your doctor if someone is hurting you or yelling at you.  Talk with your doctor if you are feeling sad or have thoughts of hurting yourself. Lifestyle  Do not use hot tubs, steam rooms, or saunas.  Do not douche. Do not use tampons or scented sanitary pads.  Do not use herbal medicines, illegal drugs, or medicines that are not approved by your doctor. Do not drink alcohol.  Do not smoke or use any products that contain nicotine or tobacco. If you need help quitting, ask your doctor.  Avoid cat litter boxes and soil that is used by cats. These carry germs that can cause harm to the baby and can cause a loss of your baby by miscarriage or stillbirth. General instructions  Keep all follow-up visits. This is important.  Ask for help if you need counseling or if you need help with nutrition. Your doctor can give you advice or tell you where to go for help.  Visit your dentist. At home, brush your teeth with a soft toothbrush.  Floss gently.  Write down your questions. Take them to your prenatal visits. Where to find more information  American Pregnancy Association: americanpregnancy.org  [employer] of Obstetricians and Gynecologists: www.acog.org  Office on Women's Health: KeywordPortfolios.com.br Contact a doctor if:  You are dizzy.  You have a fever.  You have mild cramps or pressure in your lower belly.  You have a nagging pain in your belly area.  You continue to feel like you may vomit, you vomit, or you have watery poop (diarrhea) for 24  hours or longer.  You have a bad-smelling fluid coming from your vagina.  You have pain when you pee.  You are exposed to a disease that spreads from person to person, such as chickenpox, measles, Zika virus, HIV, or hepatitis. Get help right away if:  You have spotting or bleeding from your vagina.  You have very bad belly cramping or pain.  You have shortness of breath or chest pain.  You have any kind of injury, such as from a fall or a car crash.  You have new or increased pain, swelling, or redness in an arm or leg. Summary  The first trimester of pregnancy starts on the first day of your last menstrual period until the end of week 12 (months 1 through 3).  Eat 4 or 5 small meals a day instead of 3 large meals.  Do not smoke or use any products that contain nicotine or tobacco. If you need help quitting, ask your doctor.  Keep all follow-up visits. This information is not intended to replace advice given to you by your health care provider. Make sure you discuss any questions you have with your health care provider. Document Revised: 03/08/2020 Document Reviewed: 01/13/2020 Elsevier Patient Education  2021 [place]. Commonly Asked Questions During Pregnancy  Cats: A parasite can be excreted in cat feces.  To avoid exposure you need to have another person empty the little box.  If you must empty the litter box you will need to wear gloves.  Wash your hands after handling your cat.  This parasite can also be found in raw or undercooked meat so this should also be avoided.  Colds, Sore Throats, Flu: Please check your medication sheet to see what you can take for symptoms.  If your symptoms are unrelieved by these medications please call the office.  Dental Work: Most any dental work Investment banker, corporate recommends is permitted.  X-rays should only be taken during the first trimester if absolutely necessary.  Your abdomen should be shielded with a lead apron during all x-rays.   Please notify your provider prior to receiving any x-rays.  Novocaine is fine; gas is not recommended.  If your dentist requires a note from [place] prior to dental work please call the office and we will provide one for you.  Exercise: Exercise is an important part of staying healthy during your pregnancy.  You may continue most exercises you were accustomed to prior to pregnancy.  Later in your pregnancy you will most likely notice you have difficulty with activities requiring balance like riding a bicycle.  It is important that you listen to your body and avoid activities that put you at a higher risk of falling.  Adequate rest and staying well hydrated are a must!  If you have questions about the safety of specific activities ask your provider.    Exposure to Children with illness: Try to avoid obvious exposure; report any symptoms to [place] when noted,  If you have chicken pos, red measles or mumps, you should be immune to these diseases.   Please do not take any vaccines while pregnant unless you have checked with your OB provider.  Fetal Movement: After 28 weeks we recommend you do "kick counts" twice daily.  Lie or sit down in a calm quiet environment and count your baby movements "kicks".  You should feel your baby at least 10 times per hour.  If you have not felt 10 kicks within the first hour get up, walk around and have something sweet to eat or drink then repeat for an additional hour.  If count remains less than 10 per hour notify your provider.  Fumigating: Follow your pest control agent's advice as to how long to stay out of your home.  Ventilate the area well before re-entering.  Hemorrhoids:   Most over-the-counter preparations can be used during pregnancy.  Check your medication to see what is safe to use.  It is important to use a stool softener or fiber in your diet and to drink lots of liquids.  If hemorrhoids seem to be getting worse please call the office.   Hot Tubs:  Hot tubs Jacuzzis  and saunas are not recommended while pregnant.  These increase your internal body temperature and should be avoided.  Intercourse:  Sexual intercourse is safe during pregnancy as long as you are comfortable, unless otherwise advised by your provider.  Spotting may occur after intercourse; report any bright red bleeding that is heavier than spotting.  Labor:  If you know that you are in labor, please go to the hospital.  If you are unsure, please call the office and let us help you decide what to do.  Lifting, straining, etc:  If your job requires heavy lifting or straining please check with your provider for any limitations.  Generally, you should not lift items heavier than that you can lift simply with your hands and arms (no back muscles)  Painting:  Paint fumes do not harm your pregnancy, but may make you ill and should be avoided if possible.  Latex or water based paints have less odor than oils.  Use adequate ventilation while painting.  Permanents & Hair Color:  Chemicals in hair dyes are not recommended as they cause increase hair dryness which can increase hair loss during pregnancy.  " Highlighting" and permanents are allowed.  Dye may be absorbed differently and permanents may not hold as well during pregnancy.  Sunbathing:  Use a sunscreen, as skin burns easily during pregnancy.  Drink plenty of fluids; avoid over heating.  Tanning Beds:  Because their possible side effects are still unknown, tanning beds are not recommended.  Ultrasound Scans:  Routine ultrasounds are performed at approximately 20 weeks.  You will be able to see your baby's general anatomy an if you would like to know the gender this can usually be determined as well.  If it is questionable when you conceived you may also receive an ultrasound early in your pregnancy for dating purposes.  Otherwise ultrasound exams are not routinely performed unless there is a medical necessity.  Although you can request a scan we ask  that you pay for it when conducted because insurance does not cover " patient request" scans.  Work: If your pregnancy proceeds without complications you may work until your due date, unless your physician or employer advises otherwise.  Round Ligament Pain/Pelvic Discomfort:  Sharp, shooting pains not associated with bleeding are fairly common,  usually occurring in the second trimester of pregnancy.  They tend to be worse when standing up or when you remain standing for long periods of time.  These are the result of pressure of certain pelvic ligaments called "round ligaments".  Rest, Tylenol and heat seem to be the most effective relief.  As the womb and fetus grow, they rise out of the pelvis and the discomfort improves.  Please notify the office if your pain seems different than that described.  It may represent a more serious condition.  Common Medications Safe in Pregnancy  Acne:      Constipation:  Benzoyl Peroxide     Colace  Clindamycin      Dulcolax Suppository  Topica Erythromycin     Fibercon  Salicylic Acid      Metamucil         Miralax AVOID:        Senakot   Accutane    Cough:  Retin-A       Cough Drops  Tetracycline      Phenergan w/ Codeine if Rx  Minocycline      Robitussin (Plain & DM)  Antibiotics:     Crabs/Lice:  Ceclor       RID  Cephalosporins    AVOID:  E-Mycins      Kwell  Keflex  Macrobid/Macrodantin   Diarrhea:  Penicillin      Kao-Pectate  Zithromax      Imodium AD         PUSH FLUIDS AVOID:       Cipro     Fever:  Tetracycline      Tylenol (Regular or Extra  Minocycline       Strength)  Levaquin      Extra Strength-Do not          Exceed 8 tabs/24 hrs Caffeine:        '200mg'$ /day (equiv. To 1 cup of coffee or  approx. 3 12 oz sodas)         Gas: Cold/Hayfever:       Gas-X  Benadryl      Mylicon  Claritin       Phazyme  **Claritin-D        Chlor-Trimeton    Headaches:  Dimetapp      ASA-Free Excedrin  Drixoral-Non-Drowsy     Cold  Compress  Mucinex (Guaifenasin)     Tylenol (Regular or Extra  Sudafed/Sudafed-12 Hour     Strength)  **Sudafed PE Pseudoephedrine   Tylenol Cold & Sinus     Vicks Vapor Rub  Zyrtec  **AVOID if Problems With Blood Pressure         Heartburn: Avoid lying down for at least 1 hour after meals  Aciphex      Maalox     Rash:  Milk of Magnesia     Benadryl    Mylanta       1% Hydrocortisone Cream  Pepcid  Pepcid Complete   Sleep Aids:  Prevacid      Ambien   Prilosec       Benadryl  Rolaids       Chamomile Tea  Tums (Limit 4/day)     Unisom         Tylenol PM         Warm milk-add vanilla or  Hemorrhoids:       Sugar for taste  Anusol/Anusol H.C.  (RX: Analapram 2.5%)  Sugar Substitutes:  Hydrocortisone OTC  Ok in moderation  Preparation H      Tucks        Vaseline lotion applied to tissue with wiping    Herpes:     Throat:  Acyclovir      Oragel  Famvir  Valtrex     Vaccines:         Flu Shot Leg Cramps:       *Gardasil  Benadryl      Hepatitis A         Hepatitis B Nasal Spray:       Pneumovax  Saline Nasal Spray     Polio Booster         Tetanus Nausea:       Tuberculosis test or PPD  Vitamin B6 25 mg TID   AVOID:    Dramamine      *Gardasil  Emetrol       Live Poliovirus  Ginger Root 250 mg QID    MMR (measles, mumps &  High Complex Carbs @ Bedtime    rebella)  Sea Bands-Accupressure    Varicella (Chickenpox)  Unisom 1/2 tab TID     *No known complications           If received before Pain:         Known pregnancy;   Darvocet       Resume series after  Lortab        Delivery  Percocet    Yeast:   Tramadol      Femstat  Tylenol 3      Gyne-lotrimin  Ultram       Monistat  Vicodin           MISC:         All Sunscreens           Hair Coloring/highlights          Insect Repellant's          (Including DEET)         Mystic Tans  

## 2020-11-24 ENCOUNTER — Encounter: Payer: Self-pay | Admitting: Certified Nurse Midwife

## 2020-11-24 DIAGNOSIS — Z6740 Type O blood, Rh positive: Secondary | ICD-10-CM | POA: Insufficient documentation

## 2020-11-24 LAB — URINALYSIS, ROUTINE W REFLEX MICROSCOPIC
Bilirubin, UA: NEGATIVE
Glucose, UA: NEGATIVE
Ketones, UA: NEGATIVE
Nitrite, UA: NEGATIVE
Protein,UA: NEGATIVE
RBC, UA: NEGATIVE
Specific Gravity, UA: 1.006 (ref 1.005–1.030)
Urobilinogen, Ur: 0.2 mg/dL (ref 0.2–1.0)
pH, UA: 7.5 (ref 5.0–7.5)

## 2020-11-24 LAB — MICROSCOPIC EXAMINATION
Bacteria, UA: NONE SEEN
Casts: NONE SEEN /lpf
RBC, Urine: NONE SEEN /hpf (ref 0–2)
WBC, UA: NONE SEEN /hpf (ref 0–5)

## 2020-11-24 LAB — HEPATITIS C ANTIBODY: Hep C Virus Ab: <0.1 s/co ratio (ref 0.0–0.9)

## 2020-11-24 LAB — RUBELLA SCREEN: Rubella Antibodies, IGG: 1.67 index (ref >0.99)

## 2020-11-24 LAB — ABO AND RH: Rh Factor: POSITIVE

## 2020-11-24 LAB — ANTIBODY SCREEN: Antibody Screen: NEGATIVE

## 2020-11-24 LAB — HEMOGLOBIN A1C
Est. average glucose Bld gHb Est-mCnc: 100 mg/dL
Hgb A1c MFr Bld: 5.1 % (ref 4.8–5.6)

## 2020-11-24 LAB — RPR: RPR Ser Ql: NONREACTIVE

## 2020-11-24 LAB — TSH: TSH: 1.3 u[IU]/mL (ref 0.450–4.500)

## 2020-11-24 LAB — GC/CHLAMYDIA PROBE AMP
Chlamydia trachomatis, NAA: NEGATIVE
Neisseria Gonorrhoeae by PCR: NEGATIVE

## 2020-11-24 LAB — HBV PRENATAL SCREEN: Hepatitis B Surface Ag: NEGATIVE

## 2020-11-24 LAB — HIV ANTIBODY (ROUTINE TESTING W REFLEX): HIV Screen 4th Generation wRfx: NONREACTIVE

## 2020-11-25 LAB — DRUG PROFILE, UR, 9 DRUGS (LABCORP)
Amphetamines, Urine: NEGATIVE ng/mL
Barbiturate Quant, Ur: NEGATIVE ng/mL
Benzodiazepine Quant, Ur: NEGATIVE ng/mL
Cannabinoid Quant, Ur: NEGATIVE ng/mL
Cocaine (Metab.): NEGATIVE ng/mL
Methadone Screen, Urine: NEGATIVE ng/mL
Opiate Quant, Ur: NEGATIVE ng/mL
PCP Quant, Ur: NEGATIVE ng/mL
Propoxyphene: NEGATIVE ng/mL

## 2020-11-25 LAB — NICOTINE SCREEN, URINE: Cotinine Ql Scrn, Ur: NEGATIVE ng/mL

## 2020-11-25 LAB — URINE CULTURE, OB REFLEX

## 2020-11-25 LAB — CULTURE, OB URINE

## 2020-12-15 ENCOUNTER — Encounter: Payer: Self-pay | Admitting: Certified Nurse Midwife

## 2020-12-15 ENCOUNTER — Ambulatory Visit: Payer: BC Managed Care – PPO | Admitting: Certified Nurse Midwife

## 2020-12-15 ENCOUNTER — Other Ambulatory Visit: Payer: Self-pay

## 2020-12-15 VITALS — BP 125/75 | HR 98 | Wt 266.1 lb

## 2020-12-15 DIAGNOSIS — O99210 Obesity complicating pregnancy, unspecified trimester: Secondary | ICD-10-CM

## 2020-12-15 DIAGNOSIS — Z3401 Encounter for supervision of normal first pregnancy, first trimester: Secondary | ICD-10-CM

## 2020-12-15 DIAGNOSIS — Z3481 Encounter for supervision of other normal pregnancy, first trimester: Secondary | ICD-10-CM | POA: Diagnosis not present

## 2020-12-15 DIAGNOSIS — Z1379 Encounter for other screening for genetic and chromosomal anomalies: Secondary | ICD-10-CM | POA: Insufficient documentation

## 2020-12-15 DIAGNOSIS — Z3A12 12 weeks gestation of pregnancy: Secondary | ICD-10-CM | POA: Diagnosis not present

## 2020-12-15 DIAGNOSIS — Z130 Encounter for screening for diseases of the blood and blood-forming organs and certain disorders involving the immune mechanism: Secondary | ICD-10-CM

## 2020-12-15 HISTORY — DX: Encounter for other screening for genetic and chromosomal anomalies: Z13.79

## 2020-12-15 LAB — POCT URINALYSIS DIPSTICK OB
Bilirubin, UA: NEGATIVE
Blood, UA: NEGATIVE
Glucose, UA: NEGATIVE
Ketones, UA: NEGATIVE
Leukocytes, UA: NEGATIVE
Nitrite, UA: NEGATIVE
POC,PROTEIN,UA: NEGATIVE
Spec Grav, UA: 1.025 (ref 1.010–1.025)
Urobilinogen, UA: 0.2 E.U./dL
pH, UA: 6 (ref 5.0–8.0)

## 2020-12-15 MED ORDER — ASPIRIN EC 81 MG PO TBEC: 81.0000 mg | DELAYED_RELEASE_TABLET | Freq: Every day | ORAL | 2 refills | Status: DC

## 2020-12-15 NOTE — Progress Notes (Signed)
I have seen, interviewed, and examined the patient in conjunction with the [employer] [facility] [employer] and affirm the diagnosis and management plan.   Gunnar Bulla, CNM [facility], [facility] 12/15/20 4:54 PM

## 2020-12-15 NOTE — Progress Notes (Addendum)
NEW OB HISTORY AND PHYSICAL  SUBJECTIVE:       Hannah Stevens is a 30 y.o. G27P0000 female, Patient's last menstrual period was 09/22/2020., Estimated Date of Delivery: 06/29/21, [redacted]w[redacted]d, presents today for establishment of Prenatal Care.  Desires genetic testing. Prefers midwifery care.   Reports intermittent numbness and tingling in left leg, improving fatigue and increased flatus.   Denies difficulty breathing, respiratory distress, chest pain, nausea or vomiting, and leg swelling.  Gynecologic History  Patient's last menstrual period was 09/22/2020.    Contraception: none   Last Pap: Patient reported pap done last year and was Normal. 06/29/18 is documented in computer. Results were: normal  Obstetric History  OB History  Gravida Para Term Preterm AB Living  1 0 0 0 0 0  SAB IAB Ectopic Multiple Live Births  0 0 0 0      # Outcome Date GA Lbr Len/2nd Weight Sex Delivery Anes PTL Lv  1 Current             Obstetric Comments  1st Menstrual Cycle: 9   1st Pregnancy: N/A    Past Medical History:  Diagnosis Date  . Anxiety   . Ovarian cyst   . Pelvic pain   . Vaccine for human papilloma virus (HPV) types 6, 11, 16, and 18 administered     Past Surgical History:  Procedure Laterality Date  . TONSILLECTOMY    . WISDOM TOOTH EXTRACTION      Current Outpatient Medications on File Prior to Visit  Medication Sig Dispense Refill  . FIBER ADULT GUMMIES PO Take by mouth.    . Prenatal Multivit-Min-Fe-FA (PRE-NATAL PO) Take by mouth daily.      No current facility-administered medications on file prior to visit.    No Known Allergies  Social History   Socioeconomic History  . Marital status: Married    Spouse name: Not on file  . Number of children: Not on file  . Years of education: Not on file  . Highest education level: Not on file  Occupational History  . Not on file  Tobacco Use  . Smoking status: Former Smoker    Years: 1.00    Quit date: 10/14/2014     Years since quitting: 6.1  . Smokeless tobacco: Never Used  Vaping Use  . Vaping Use: Never used  Substance and Sexual Activity  . Alcohol use: Not Currently    Alcohol/week: 0.0 standard drinks    Comment: occasionally  . Drug use: No  . Sexual activity: Yes  Other Topics Concern  . Not on file  Social History Narrative  . Not on file   Social Determinants of Health   Financial Resource Strain: Not on file  Food Insecurity: Not on file  Transportation Needs: Not on file  Physical Activity: Not on file  Stress: Not on file  Social Connections: Not on file  Intimate Partner Violence: Not on file    Family History  Problem Relation Age of Onset  . Healthy Mother   . Diabetes Father     The following portions of the patient's history were reviewed and updated as appropriate: allergies, current medications, past OB history, past medical history, past surgical history, past family history, past social history, and problem list.  Review of Systems:  ROS negative except as noted above. Information obtained from patient.   OBJECTIVE:  BP 125/75   Pulse 98   Wt 120.7 kg   LMP 09/22/2020   BMI 47.14  kg/m    Initial Physical Exam (New OB)  GENERAL APPEARANCE: alert, well appearing, in no apparent distress, oriented to person, place and time, well hydrated  HEAD: normocephalic, atraumatic  THYROID: no thyromegaly or masses present  BREASTS: no masses noted, no significant tenderness, no palpable axillary nodes, no skin changes  LUNGS: clear to auscultation, no wheezes, rales or rhonchi, symmetric air entry  HEART: regular rate and rhythm, no murmurs  ABDOMEN: soft, nontender, nondistended, no abnormal masses, no epigastric pain, obese and fundus soft, nontender 12 weeks size. FHT obtained from abdominal ultrasound.  EXTREMITIES: no redness or tenderness in the calves or thighs, no edema, no limitation in range of motion  SKIN: normal coloration and turgor, no  rashes  NEUROLOGIC: alert, oriented, normal speech, no focal findings or movement disorder noted  PELVIC EXAM: Deferred exam  ASSESSMENT:  Normal pregnancy  [redacted] weeks gestation in pregnancy  Obesity in pregnancy  Screening for anemia deficiency   PLAN: Prenatal care  New OB counseling:  The patient has been given an overview regarding routine prenatal care.  Recommendations regarding diet, weight gain, and exercise in pregnancy were given.  Prenatal testing, optional genetic testing, and ultrasound use in pregnancy were reviewed.   Benefits of Breast Feeding were discussed. The patient is encouraged to consider nursing her baby  post partum.  CBC and Panorama today, see orders  Rx baby Aspirin   Discussed weight gain in pregnancy and diet, education in chart, see AVS.  Anticipatory guidance regarding course of prenatal care.  Reviewed red flag symptoms and when to call.  RTC x 4 weeks for ROB with ANNIE or sooner if needed.  Juliann Pares, Student-MidWife [employer] 12/15/20 4:26 PM

## 2020-12-15 NOTE — Patient Instructions (Addendum)
Eating Plan for Pregnant Women While you are pregnant, your body requires additional nutrition to help support your growing baby. You also have a higher need for some vitamins and minerals, such as folic acid, calcium, iron, and vitamin D. Eating a healthy, well-balanced diet is very important for your health and your baby's health. Your need for extra calories varies over the course of your pregnancy. Pregnancy is divided into three trimesters, with each trimester lasting 3 months. For most women, it is recommended to consume:  150 extra calories a day during the first trimester.  300 extra calories a day during the second trimester.  300 extra calories a day during the third trimester. What are tips for following this plan? Cooking  Practice good food safety and cleanliness. Wash your hands before you eat and after you prepare raw meat. Wash all fruits and vegetables well before peeling or eating. Taking these actions can help to prevent foodborne illnesses that can be very dangerous to your baby, such as listeriosis. Ask your health care provider for more information about listeriosis.  Make sure that all meats, poultry, and eggs are cooked to food-safe temperatures or "well-done." Meal planning  Eat a variety of foods (especially fruits and vegetables) to get a full range of vitamins and minerals.  Two or more servings of fish are recommended each week in order to get the most benefits from omega-3 fatty acids that are found in seafood. Choose fish that are lower in mercury, such as salmon and pollock.  Limit your overall intake of foods that have "empty calories." These are foods that have little nutritional value, such as sweets, desserts, candies, and sugar-sweetened beverages.  Drinks that contain caffeine are okay to drink, but it is better to avoid caffeine. Keep your total caffeine intake to less than 200 mg each day (which is 12 oz or 355 mL of coffee, tea, or soda) or the limit as  told by your health care provider.   General information  Do not try to lose weight or go on a diet during pregnancy.  Take a prenatal vitamin to help meet your additional vitamin and mineral needs during pregnancy, specifically for folic acid, iron, calcium, and vitamin D.  Remember to stay active. Ask your health care provider what types of exercise and activities are safe for you. What does 150 extra calories look like? Healthy options that provide 150 extra calories each day could be any of the following:  6-8 oz (170-227 g) plain low-fat yogurt with  cup (70 g) berries.  1 apple with 2 tsp (11 g) peanut butter.  Cut-up vegetables with  cup (60 g) hummus.  8 fl oz (237 mL) low-fat chocolate milk.  1 stick of string cheese with 1 medium orange.  1 peanut butter and jelly sandwich that is made with one slice of whole-wheat bread and 1 tsp (5 g) of peanut butter. For 300 extra calories, you could eat two of these healthy options each day. What is a healthy amount of weight to gain? The right amount of weight gain for you is based on your BMI (body mass index) before you became pregnant.  If your BMI was less than 18 (underweight), you should gain 28-40 lb (13-18 kg).  If your BMI was 18-24.9 (normal), you should gain 25-35 lb (11-16 kg).  If your BMI was 25-29.9 (overweight), you should gain 15-25 lb (7-11 kg).  If your BMI was 30 or greater (obese), you should gain 11-20 lb (  5-9 kg). What if I am having twins or multiples? Generally, if you are carrying twins or multiples:  You may need to eat 300-600 extra calories a day.  The recommended range for total weight gain is 25-54 lb (11-25 kg), depending on your BMI before pregnancy.  Talk with your health care provider to find out about nutritional needs, weight gain, and exercise that is right for you. What foods should I eat? Fruits All fruits. Eat a variety of colors and types of fruit. Remember to wash your fruits well  before peeling or eating. Vegetables All vegetables. Eat a variety of colors and types of vegetables. Remember to wash your vegetables well before peeling or eating. Grains All grains. Choose whole grains, such as whole-wheat bread, oatmeal, or brown rice. Meats and other protein foods Lean meats, including chicken, [place], and lean cuts of beef, veal, or pork. Fish that is higher in omega-3 fatty acids and lower in mercury, such as salmon, herring, mussels, trout, sardines, pollock, shrimp, crab, and lobster. Tofu. Tempeh. Beans. Eggs. Peanut butter and other nut butters. Dairy Pasteurized milk and milk alternatives, such as almond milk. Pasteurized yogurt and pasteurized cheese. Cottage cheese. Sour cream. Beverages Water. Juices that contain 100% fruit juice or vegetable juice. Caffeine-free teas and decaffeinated coffee. Fats and oils Fats and oils are okay to include in moderation. Sweets and desserts Sweets and desserts are okay to include in moderation. Seasoning and other foods All pasteurized condiments. The items listed above may not be a complete list of foods and beverages you can eat. Contact a dietitian for more information.   What foods should I avoid? Fruits Raw (unpasteurized) fruit juices. Vegetables Unpasteurized vegetable juices. Meats and other protein foods Precooked or cured meat, such as bologna, hot dogs, sausages, or meat loaves. (If you must eat those meats, reheat them until they are steaming hot.) Refrigerated pate, meat spreads from a meat counter, or smoked seafood that is found in the refrigerated section of a store. Raw or undercooked meats, poultry, and eggs. Raw fish, such as sushi or sashimi. Fish that have high mercury content, such as tilefish, shark, swordfish, and king mackerel. Dairy Unpasteurized milk and any foods that have unpasteurized milk in them. Soft cheeses, such as feta, queso blanco, queso fresco, [place], [place], panela, and blue-veined  cheeses (unless they are made with pasteurized milk, which must be stated on the label). Beverages Alcohol. Sugar-sweetened beverages, such as sodas, teas, or energy drinks. Seasoning and other foods Homemade fermented foods and drinks, such as pickles, sauerkraut, or kombucha drinks. (Store-bought pasteurized versions of these are okay.) Salads that are made in a store or deli, such as ham salad, chicken salad, egg salad, tuna salad, and seafood salad. The items listed above may not be a complete list of foods and beverages you should avoid. Contact a dietitian for more information. Where to find more information To calculate the number of calories you need based on your height, weight, and activity level, you can use an online calculator such as:  https://www.hunter.com/ To calculate how much weight you should gain during pregnancy, you can use an online pregnancy weight gain calculator such as:  MassVoice.es To learn more about eating fish during pregnancy, talk with your health care provider or visit:  [employer] Summary  While you are pregnant, your body requires additional nutrition to help support your growing baby.  Eat a variety of foods, especially fruits and vegetables, to get a full range of vitamins and minerals.  Practice good food safety and cleanliness. Wash your hands before you eat and after you prepare raw meat. Wash all fruits and vegetables well before peeling or eating. Taking these actions can help to prevent foodborne illnesses, such as listeriosis, that can be very dangerous to your baby.  Do not eat raw meat or fish. Do not eat fish that have high mercury content, such as tilefish, shark, swordfish, and king mackerel. Do not eat raw (unpasteurized) dairy.  Take a prenatal vitamin to help meet your additional vitamin and mineral needs during pregnancy, specifically for folic acid, iron, calcium, and vitamin D. This information is not intended to replace  advice given to you by your health care provider. Make sure you discuss any questions you have with your health care provider. Document Revised: 04/27/2020 Document Reviewed: 04/27/2020 Elsevier Patient Education  2021 [place].   Sciatica  Sciatica is pain, weakness, tingling, or loss of feeling (numbness) along the sciatic nerve. The sciatic nerve starts in the lower back and goes down the back of each leg. Sciatica usually goes away on its own or with treatment. Sometimes, sciatica may come back (recur). What are the causes? This condition happens when the sciatic nerve is pinched or has pressure put on it. This may be the result of:  A disk in between the bones of the spine bulging out too far (herniated disk).  Changes in the spinal disks that occur with aging.  A condition that affects a muscle in the butt.  Extra bone growth near the sciatic nerve.  A break (fracture) of the area between your hip bones (pelvis).  Pregnancy.  Tumor. This is rare. What increases the risk? You are more likely to develop this condition if you:  Play sports that put pressure or stress on the spine.  Have poor strength and ease of movement (flexibility).  Have had a back injury in the past.  Have had back surgery.  Sit for long periods of time.  Do activities that involve bending or lifting over and over again.  Are very overweight (obese). What are the signs or symptoms? Symptoms can vary from mild to very bad. They may include:  Any of these problems in the lower back, leg, hip, or butt: ? Mild tingling, loss of feeling, or dull aches. ? Burning sensations. ? Sharp pains.  Loss of feeling in the back of the calf or the sole of the foot.  Leg weakness.  Very bad back pain that makes it hard to move. These symptoms may get worse when you cough, sneeze, or laugh. They may also get worse when you sit or stand for long periods of time. How is this treated? This condition  often gets better without any treatment. However, treatment may include:  Changing or cutting back on physical activity when you have pain.  Doing exercises and stretching.  Putting ice or heat on the affected area.  Medicines that help: ? To relieve pain and swelling. ? To relax your muscles.  Shots (injections) of medicines that help to relieve pain, irritation, and swelling.  Surgery. Follow these instructions at home: Medicines  Take over-the-counter and prescription medicines only as told by your doctor.  Ask your doctor if the medicine prescribed to you: ? Requires you to avoid driving or using heavy machinery. ? Can cause trouble pooping (constipation). You may need to take these steps to prevent or treat trouble pooping:  Drink enough fluids to keep your pee (urine) pale yellow.  Take over-the-counter or prescription medicines.  Eat foods that are high in fiber. These include beans, whole grains, and fresh fruits and vegetables.  Limit foods that are high in fat and sugar. These include fried or sweet foods. Managing pain  If told, put ice on the affected area. ? Put ice in a plastic bag. ? Place a towel between your skin and the bag. ? Leave the ice on for 20 minutes, 2-3 times a day.  If told, put heat on the affected area. Use the heat source that your doctor tells you to use, such as a moist heat pack or a heating pad. ? Place a towel between your skin and the heat source. ? Leave the heat on for 20-30 minutes. ? Remove the heat if your skin turns bright red. This is very important if you are unable to feel pain, heat, or cold. You may have a greater risk of getting burned.      Activity  Return to your normal activities as told by your doctor. Ask your doctor what activities are safe for you.  Avoid activities that make your symptoms worse.  Take short rests during the day. ? When you rest for a long time, do some physical activity or stretching between  periods of rest. ? Avoid sitting for a long time without moving. Get up and move around at least one time each hour.  Exercise and stretch regularly, as told by your doctor.  Do not lift anything that is heavier than 10 lb (4.5 kg) while you have symptoms of sciatica. ? Avoid lifting heavy things even when you do not have symptoms. ? Avoid lifting heavy things over and over.  When you lift objects, always lift in a way that is safe for your body. To do this, you should: ? Bend your knees. ? Keep the object close to your body. ? Avoid twisting.   General instructions  Stay at a healthy weight.  Wear comfortable shoes that support your feet. Avoid wearing high heels.  Avoid sleeping on a mattress that is too soft or too hard. You might have less pain if you sleep on a mattress that is firm enough to support your back.  Keep all follow-up visits as told by your doctor. This is important. Contact a doctor if:  You have pain that: ? Wakes you up when you are sleeping. ? Gets worse when you lie down. ? Is worse than the pain you have had in the past. ? Lasts longer than 4 weeks.  You lose weight without trying. Get help right away if:  You cannot control when you pee (urinate) or poop (have a bowel movement).  You have weakness in any of these areas and it gets worse: ? Lower back. ? The area between your hip bones. ? Butt. ? Legs.  You have redness or swelling of your back.  You have a burning feeling when you pee. Summary  Sciatica is pain, weakness, tingling, or loss of feeling (numbness) along the sciatic nerve.  This condition happens when the sciatic nerve is pinched or has pressure put on it.  Sciatica can cause pain, tingling, or loss of feeling (numbness) in the lower back, legs, hips, and butt.  Treatment often includes rest, exercise, medicines, and putting ice or heat on the affected area. This information is not intended to replace advice given to you by  your health care provider. Make sure you discuss any questions you have with your health  care provider. Document Revised: 10/19/2018 Document Reviewed: 10/19/2018 Elsevier Patient Education  [place].    Common Medications Safe in Pregnancy  Acne:      Constipation:  Benzoyl Peroxide     Colace  Clindamycin      Dulcolax Suppository  Topica Erythromycin     Fibercon  Salicylic Acid      Metamucil         Miralax AVOID:        Senakot   Accutane    Cough:  Retin-A       Cough Drops  Tetracycline      Phenergan w/ Codeine if Rx  Minocycline      Robitussin (Plain & DM)  Antibiotics:     Crabs/Lice:  Ceclor       RID  Cephalosporins    AVOID:  E-Mycins      Kwell  Keflex  Macrobid/Macrodantin   Diarrhea:  Penicillin      Kao-Pectate  Zithromax      Imodium AD         PUSH FLUIDS AVOID:       Cipro     Fever:  Tetracycline      Tylenol (Regular or Extra  Minocycline       Strength)  Levaquin      Extra Strength-Do not          Exceed 8 tabs/24 hrs Caffeine:        '200mg'$ /day (equiv. To 1 cup of coffee or  approx. 3 12 oz sodas)         Gas: Cold/Hayfever:       Gas-X  Benadryl      Mylicon  Claritin       Phazyme  **Claritin-D        Chlor-Trimeton    Headaches:  Dimetapp      ASA-Free Excedrin  Drixoral-Non-Drowsy     Cold Compress  Mucinex (Guaifenasin)     Tylenol (Regular or Extra  Sudafed/Sudafed-12 Hour     Strength)  **Sudafed PE Pseudoephedrine   Tylenol Cold & Sinus     Vicks Vapor Rub  Zyrtec  **AVOID if Problems With Blood Pressure         Heartburn: Avoid lying down for at least 1 hour after meals  Aciphex      Maalox     Rash:  Milk of Magnesia     Benadryl    Mylanta       1% Hydrocortisone Cream  Pepcid  Pepcid Complete   Sleep Aids:  Prevacid      Ambien   Prilosec       Benadryl  Rolaids       Chamomile Tea  Tums (Limit 4/day)     Unisom         Tylenol PM         Warm milk-add vanilla or  Hemorrhoids:       Sugar for  taste  Anusol/Anusol H.C.  (RX: Analapram 2.5%)  Sugar Substitutes:  Hydrocortisone OTC     Ok in moderation  Preparation H      Tucks        Vaseline lotion applied to tissue with wiping    Herpes:     Throat:  Acyclovir      Oragel  Famvir  Valtrex     Vaccines:         Flu Shot Leg Cramps:       *Gardasil  Benadryl  Hepatitis A         Hepatitis B Nasal Spray:       Pneumovax  Saline Nasal Spray     Polio Booster         Tetanus Nausea:       Tuberculosis test or PPD  Vitamin B6 25 mg TID   AVOID:    Dramamine      *Gardasil  Emetrol       Live Poliovirus  Ginger Root 250 mg QID    MMR (measles, mumps &  High Complex Carbs @ Bedtime    rebella)  Sea Bands-Accupressure    Varicella (Chickenpox)  Unisom 1/2 tab TID     *No known complications           If received before Pain:         Known pregnancy;   Darvocet       Resume series after  Lortab        Delivery  Percocet    Yeast:   Tramadol      Femstat  Tylenol 3      Gyne-lotrimin  Ultram       Monistat  Vicodin           MISC:         All Sunscreens           Hair Coloring/highlights          Insect Repellant's          (Including DEET)         Mystic Tans   Second Trimester of Pregnancy  The second trimester of pregnancy is from week 13 through week 27. This is also called months 4 through 6 of pregnancy. This is often the time when you feel your best. During the second trimester:  Morning sickness is less or has stopped.  You may have more energy.  You may feel hungry more often. At this time, your unborn baby (fetus) is growing very fast. At the end of the sixth month, the unborn baby may be up to 12 inches long and weigh about 1 pounds. You will likely start to feel the baby move between 16 and 20 weeks of pregnancy. Body changes during your second trimester Your body continues to go through many changes during this time. The changes vary and generally return to normal after the baby is  born. Physical changes  You will gain more weight.  You may start to get stretch marks on your hips, belly (abdomen), and breasts.  Your breasts will grow and may hurt.  Dark spots or blotches may develop on your face.  A dark line from your belly button to the pubic area (linea nigra) may appear.  You may have changes in your hair. Health changes  You may have headaches.  You may have heartburn.  You may have trouble pooping (constipation).  You may have hemorrhoids or swollen, bulging veins (varicose veins).  Your gums may bleed.  You may pee (urinate) more often.  You may have back pain. Follow these instructions at home: Medicines  Take over-the-counter and prescription medicines only as told by your doctor. Some medicines are not safe during pregnancy.  Take a prenatal vitamin that contains at least 600 micrograms (mcg) of folic acid. Eating and drinking  Eat healthy meals that include: ? Fresh fruits and vegetables. ? Whole grains. ? Good sources of protein, such as meat, eggs, or tofu. ? Low-fat dairy products.  Avoid  raw meat and unpasteurized juice, milk, and cheese.  You may need to take these actions to prevent or treat trouble pooping: ? Drink enough fluids to keep your pee (urine) pale yellow. ? Eat foods that are high in fiber. These include beans, whole grains, and fresh fruits and vegetables. ? Limit foods that are high in fat and sugar. These include fried or sweet foods. Activity  Exercise only as told by your doctor. Most people can do their usual exercise during pregnancy. Try to exercise for 30 minutes at least 5 days a week.  Stop exercising if you have pain or cramps in your belly or lower back.  Do not exercise if it is too hot or too humid, or if you are in a place of great height (high altitude).  Avoid heavy lifting.  If you choose to, you may have sex unless your doctor tells you not to. Relieving pain and discomfort  Wear a  good support bra if your breasts are sore.  Take warm water baths (sitz baths) to soothe pain or discomfort caused by hemorrhoids. Use hemorrhoid cream if your doctor approves.  Rest with your legs raised (elevated) if you have leg cramps or low back pain.  If you develop bulging veins in your legs: ? Wear support hose as told by your doctor. ? Raise your feet for 15 minutes, 3-4 times a day. ? Limit salt in your food. Safety  Wear your seat belt at all times when you are in a car.  Talk with your doctor if someone is hurting you or yelling at you a lot. Lifestyle  Do not use hot tubs, steam rooms, or saunas.  Do not douche. Do not use tampons or scented sanitary pads.  Avoid cat litter boxes and soil used by cats. These carry germs that can harm your baby and can cause a loss of your baby by miscarriage or stillbirth.  Do not use herbal medicines, illegal drugs, or medicines that are not approved by your doctor. Do not drink alcohol.  Do not smoke or use any products that contain nicotine or tobacco. If you need help quitting, ask your doctor. General instructions  Keep all follow-up visits. This is important.  Ask your doctor about local prenatal classes.  Ask your doctor about the right foods to eat or for help finding a counselor. Where to find more information  American Pregnancy Association: americanpregnancy.org  [employer]: www.acog.org  Office on [employer]: KeywordPortfolios.com.br Contact a doctor if:  You have a headache that does not go away when you take medicine.  You have changes in how you see, or you see spots in front of your eyes.  You have mild cramps, pressure, or pain in your lower belly.  You continue to feel like you may vomit (nauseous), you vomit, or you have watery poop (diarrhea).  You have bad-smelling fluid coming from your vagina.  You have pain when you pee or your pee smells  bad.  You have very bad swelling of your face, hands, ankles, feet, or legs.  You have a fever. Get help right away if:  You are leaking fluid from your vagina.  You have spotting or bleeding from your vagina.  You have very bad belly cramping or pain.  You have trouble breathing.  You have chest pain.  You faint.  You have not felt your baby move for the time period told by your doctor.  You have new or  increased pain, swelling, or redness in an arm or leg. Summary  The second trimester of pregnancy is from week 13 through week 27 (months 4 through 6).  Eat healthy meals.  Exercise as told by your doctor. Most people can do their usual exercise during pregnancy.  Do not use herbal medicines, illegal drugs, or medicines that are not approved by your doctor. Do not drink alcohol.  Call your doctor if you get sick or if you notice anything unusual about your pregnancy. This information is not intended to replace advice given to you by your health care provider. Make sure you discuss any questions you have with your health care provider. Document Revised: 03/08/2020 Document Reviewed: 01/13/2020 Elsevier Patient Education  2021 [employer].

## 2020-12-15 NOTE — Progress Notes (Signed)
NOB AE 12 weeks, Pt c/o left leg being numb, wants genetic testing.

## 2020-12-16 LAB — COMPREHENSIVE METABOLIC PANEL
ALT: 20 IU/L (ref 0–32)
AST: 17 IU/L (ref 0–40)
Albumin/Globulin Ratio: 1.6 (ref 1.2–2.2)
Albumin: 4.1 g/dL (ref 3.9–5.0)
Alkaline Phosphatase: 103 IU/L (ref 44–121)
BUN/Creatinine Ratio: 10 (ref 9–23)
BUN: 7 mg/dL (ref 6–20)
Bilirubin Total: <0.2 mg/dL (ref 0.0–1.2)
CO2: 23 mmol/L (ref 20–29)
Calcium: 9.1 mg/dL (ref 8.7–10.2)
Chloride: 99 mmol/L (ref 96–106)
Creatinine, Ser: 0.69 mg/dL (ref 0.57–1.00)
Globulin, Total: 2.5 g/dL (ref 1.5–4.5)
Glucose: 83 mg/dL (ref 65–99)
Potassium: 3.6 mmol/L (ref 3.5–5.2)
Sodium: 137 mmol/L (ref 134–144)
Total Protein: 6.6 g/dL (ref 6.0–8.5)
eGFR: 120 mL/min/{1.73_m2} (ref >59)

## 2020-12-16 LAB — CBC
Hematocrit: 38.1 % (ref 34.0–46.6)
Hemoglobin: 13.1 g/dL (ref 11.1–15.9)
MCH: 30.2 pg (ref 26.6–33.0)
MCHC: 34.4 g/dL (ref 31.5–35.7)
MCV: 88 fL (ref 79–97)
Platelets: 302 10*3/uL (ref 150–450)
RBC: 4.34 x10E6/uL (ref 3.77–5.28)
RDW: 12.4 % (ref 11.7–15.4)
WBC: 8.7 10*3/uL (ref 3.4–10.8)

## 2020-12-16 LAB — URIC ACID: Uric Acid: 3.8 mg/dL (ref 2.6–6.2)

## 2020-12-18 ENCOUNTER — Encounter: Payer: BC Managed Care – PPO | Admitting: Certified Nurse Midwife

## 2020-12-28 ENCOUNTER — Telehealth: Payer: Self-pay | Admitting: Certified Nurse Midwife

## 2020-12-28 NOTE — Telephone Encounter (Signed)
Called patient to review OB Payment Plan- she asked if we have received her gender results yet. Please Advise.

## 2020-12-29 ENCOUNTER — Telehealth: Payer: Self-pay | Admitting: Certified Nurse Midwife

## 2020-12-29 NOTE — Telephone Encounter (Signed)
New message   Patient calling for genetic testing results.

## 2021-01-01 NOTE — Telephone Encounter (Signed)
FH made pt aware.

## 2021-01-15 ENCOUNTER — Encounter: Payer: Self-pay | Admitting: Certified Nurse Midwife

## 2021-01-15 ENCOUNTER — Other Ambulatory Visit: Payer: Self-pay

## 2021-01-15 ENCOUNTER — Ambulatory Visit (INDEPENDENT_AMBULATORY_CARE_PROVIDER_SITE_OTHER): Payer: BC Managed Care – PPO | Admitting: Certified Nurse Midwife

## 2021-01-15 VITALS — BP 125/73 | HR 93 | Wt 269.9 lb

## 2021-01-15 DIAGNOSIS — Z3A16 16 weeks gestation of pregnancy: Secondary | ICD-10-CM

## 2021-01-15 DIAGNOSIS — Z3402 Encounter for supervision of normal first pregnancy, second trimester: Secondary | ICD-10-CM

## 2021-01-15 LAB — POCT URINALYSIS DIPSTICK OB
Bilirubin, UA: NEGATIVE
Blood, UA: NEGATIVE
Glucose, UA: NEGATIVE
Ketones, UA: NEGATIVE
Leukocytes, UA: NEGATIVE
Nitrite, UA: NEGATIVE
POC,PROTEIN,UA: NEGATIVE
Spec Grav, UA: 1.025 (ref 1.010–1.025)
Urobilinogen, UA: 0.2 E.U./dL
pH, UA: 6.5 (ref 5.0–8.0)

## 2021-01-15 NOTE — Patient Instructions (Signed)
Second Trimester of Pregnancy  The second trimester of pregnancy is from week 13 through week 27. This is also called months 4 through 6 of pregnancy. This is often the time when you feel your best. During the second trimester:  Morning sickness is less or has stopped.  You may have more energy.  You may feel hungry more often. At this time, your unborn baby (fetus) is growing very fast. At the end of the sixth month, the unborn baby may be up to 12 inches long and weigh about 1 pounds. You will likely start to feel the baby move between 16 and 20 weeks of pregnancy. Body changes during your second trimester Your body continues to go through many changes during this time. The changes vary and generally return to normal after the baby is born. Physical changes  You will gain more weight.  You may start to get stretch marks on your hips, belly (abdomen), and breasts.  Your breasts will grow and may hurt.  Dark spots or blotches may develop on your face.  A dark line from your belly button to the pubic area (linea nigra) may appear.  You may have changes in your hair. Health changes  You may have headaches.  You may have heartburn.  You may have trouble pooping (constipation).  You may have hemorrhoids or swollen, bulging veins (varicose veins).  Your gums may bleed.  You may pee (urinate) more often.  You may have back pain. Follow these instructions at home: Medicines  Take over-the-counter and prescription medicines only as told by your doctor. Some medicines are not safe during pregnancy.  Take a prenatal vitamin that contains at least 600 micrograms (mcg) of folic acid. Eating and drinking  Eat healthy meals that include: ? Fresh fruits and vegetables. ? Whole grains. ? Good sources of protein, such as meat, eggs, or tofu. ? Low-fat dairy products.  Avoid raw meat and unpasteurized juice, milk, and cheese.  You may need to take these actions to prevent or  treat trouble pooping: ? Drink enough fluids to keep your pee (urine) pale yellow. ? Eat foods that are high in fiber. These include beans, whole grains, and fresh fruits and vegetables. ? Limit foods that are high in fat and sugar. These include fried or sweet foods. Activity  Exercise only as told by your doctor. Most people can do their usual exercise during pregnancy. Try to exercise for 30 minutes at least 5 days a week.  Stop exercising if you have pain or cramps in your belly or lower back.  Do not exercise if it is too hot or too humid, or if you are in a place of great height (high altitude).  Avoid heavy lifting.  If you choose to, you may have sex unless your doctor tells you not to. Relieving pain and discomfort  Wear a good support bra if your breasts are sore.  Take warm water baths (sitz baths) to soothe pain or discomfort caused by hemorrhoids. Use hemorrhoid cream if your doctor approves.  Rest with your legs raised (elevated) if you have leg cramps or low back pain.  If you develop bulging veins in your legs: ? Wear support hose as told by your doctor. ? Raise your feet for 15 minutes, 3-4 times a day. ? Limit salt in your food. Safety  Wear your seat belt at all times when you are in a car.  Talk with your doctor if someone is hurting you or yelling  at you a lot. Lifestyle  Do not use hot tubs, steam rooms, or saunas.  Do not douche. Do not use tampons or scented sanitary pads.  Avoid cat litter boxes and soil used by cats. These carry germs that can harm your baby and can cause a loss of your baby by miscarriage or stillbirth.  Do not use herbal medicines, illegal drugs, or medicines that are not approved by your doctor. Do not drink alcohol.  Do not smoke or use any products that contain nicotine or tobacco. If you need help quitting, ask your doctor. General instructions  Keep all follow-up visits. This is important.  Ask your doctor about local  prenatal classes.  Ask your doctor about the right foods to eat or for help finding a counselor. Where to find more information  American Pregnancy Association: americanpregnancy.org  American College of Obstetricians and Gynecologists: www.acog.org  Office on Women's Health: womenshealth.gov/pregnancy Contact a doctor if:  You have a headache that does not go away when you take medicine.  You have changes in how you see, or you see spots in front of your eyes.  You have mild cramps, pressure, or pain in your lower belly.  You continue to feel like you may vomit (nauseous), you vomit, or you have watery poop (diarrhea).  You have bad-smelling fluid coming from your vagina.  You have pain when you pee or your pee smells bad.  You have very bad swelling of your face, hands, ankles, feet, or legs.  You have a fever. Get help right away if:  You are leaking fluid from your vagina.  You have spotting or bleeding from your vagina.  You have very bad belly cramping or pain.  You have trouble breathing.  You have chest pain.  You faint.  You have not felt your baby move for the time period told by your doctor.  You have new or increased pain, swelling, or redness in an arm or leg. Summary  The second trimester of pregnancy is from week 13 through week 27 (months 4 through 6).  Eat healthy meals.  Exercise as told by your doctor. Most people can do their usual exercise during pregnancy.  Do not use herbal medicines, illegal drugs, or medicines that are not approved by your doctor. Do not drink alcohol.  Call your doctor if you get sick or if you notice anything unusual about your pregnancy. This information is not intended to replace advice given to you by your health care provider. Make sure you discuss any questions you have with your health care provider. Document Revised: 03/08/2020 Document Reviewed: 01/13/2020 Elsevier Patient Education  2021 Elsevier  Inc. Common Medications Safe in Pregnancy  Acne:      Constipation:  Benzoyl Peroxide     Colace  Clindamycin      Dulcolax Suppository  Topica Erythromycin     Fibercon  Salicylic Acid      Metamucil         Miralax AVOID:        Senakot   Accutane    Cough:  Retin-A       Cough Drops  Tetracycline      Phenergan w/ Codeine if Rx  Minocycline      Robitussin (Plain & DM)  Antibiotics:     Crabs/Lice:  Ceclor       RID  Cephalosporins    AVOID:  E-Mycins      Kwell  Keflex  Macrobid/Macrodantin   Diarrhea:    Penicillin      Kao-Pectate  Zithromax      Imodium AD         PUSH FLUIDS AVOID:       Cipro     Fever:  Tetracycline      Tylenol (Regular or Extra  Minocycline       Strength)  Levaquin      Extra Strength-Do not          Exceed 8 tabs/24 hrs Caffeine:        <200mg/day (equiv. To 1 cup of coffee or  approx. 3 12 oz sodas)         Gas: Cold/Hayfever:       Gas-X  Benadryl      Mylicon  Claritin       Phazyme  **Claritin-D        Chlor-Trimeton    Headaches:  Dimetapp      ASA-Free Excedrin  Drixoral-Non-Drowsy     Cold Compress  Mucinex (Guaifenasin)     Tylenol (Regular or Extra  Sudafed/Sudafed-12 Hour     Strength)  **Sudafed PE Pseudoephedrine   Tylenol Cold & Sinus     Vicks Vapor Rub  Zyrtec  **AVOID if Problems With Blood Pressure         Heartburn: Avoid lying down for at least 1 hour after meals  Aciphex      Maalox     Rash:  Milk of Magnesia     Benadryl    Mylanta       1% Hydrocortisone Cream  Pepcid  Pepcid Complete   Sleep Aids:  Prevacid      Ambien   Prilosec       Benadryl  Rolaids       Chamomile Tea  Tums (Limit 4/day)     Unisom         Tylenol PM         Warm milk-add vanilla or  Hemorrhoids:       Sugar for taste  Anusol/Anusol H.C.  (RX: Analapram 2.5%)  Sugar Substitutes:  Hydrocortisone OTC     Ok in moderation  Preparation H      Tucks        Vaseline lotion applied to tissue with  wiping    Herpes:     Throat:  Acyclovir      Oragel  Famvir  Valtrex     Vaccines:         Flu Shot Leg Cramps:       *Gardasil  Benadryl      Hepatitis A         Hepatitis B Nasal Spray:       Pneumovax  Saline Nasal Spray     Polio Booster         Tetanus Nausea:       Tuberculosis test or PPD  Vitamin B6 25 mg TID   AVOID:    Dramamine      *Gardasil  Emetrol       Live Poliovirus  Ginger Root 250 mg QID    MMR (measles, mumps &  High Complex Carbs @ Bedtime    rebella)  Sea Bands-Accupressure    Varicella (Chickenpox)  Unisom 1/2 tab TID     *No known complications           If received before Pain:         Known pregnancy;   Darvocet       Resume   series after  Lortab        Delivery  Percocet    Yeast:   Tramadol      Femstat  Tylenol 3      Gyne-lotrimin  Ultram       Monistat  Vicodin           MISC:         All Sunscreens           Hair Coloring/highlights          Insect Repellant's          (Including DEET)         Mystic Tans  

## 2021-01-15 NOTE — Progress Notes (Signed)
OB-Pt present for routine prenatal care. Pt stated having lower abd pain and pressure.

## 2021-01-15 NOTE — Progress Notes (Signed)
ROB doing well. Musculoskeletal discomforts of pregnancy reviewed. Encouraged use of belly band, discussed self help measures. Reviewed anatomy u/s in 4 wks. She verbalizes and agrees follow up in 4 wk with Marcelino Duster.   Doreene Burke, CNM

## 2021-02-12 ENCOUNTER — Other Ambulatory Visit: Payer: Self-pay

## 2021-02-12 ENCOUNTER — Encounter: Payer: BC Managed Care – PPO | Admitting: Certified Nurse Midwife

## 2021-02-12 ENCOUNTER — Encounter: Payer: Self-pay | Admitting: Certified Nurse Midwife

## 2021-02-12 ENCOUNTER — Ambulatory Visit (INDEPENDENT_AMBULATORY_CARE_PROVIDER_SITE_OTHER): Payer: BC Managed Care – PPO | Admitting: Certified Nurse Midwife

## 2021-02-12 VITALS — BP 130/80 | HR 114 | Wt 274.5 lb

## 2021-02-12 DIAGNOSIS — Z3402 Encounter for supervision of normal first pregnancy, second trimester: Secondary | ICD-10-CM

## 2021-02-12 DIAGNOSIS — Z3A20 20 weeks gestation of pregnancy: Secondary | ICD-10-CM

## 2021-02-12 LAB — POCT URINALYSIS DIPSTICK OB
Bilirubin, UA: NEGATIVE
Glucose, UA: NEGATIVE
Ketones, UA: NEGATIVE
Leukocytes, UA: NEGATIVE
Nitrite, UA: NEGATIVE
POC,PROTEIN,UA: NEGATIVE
Spec Grav, UA: 1.015 (ref 1.010–1.025)
Urobilinogen, UA: 0.2 E.U./dL
pH, UA: 6.5 (ref 5.0–8.0)

## 2021-02-12 NOTE — Progress Notes (Signed)
ROB: Doing well, she had some nausea today, but it has resolved. No new concerns.

## 2021-02-12 NOTE — Patient Instructions (Signed)
Second Trimester of Pregnancy  The second trimester of pregnancy is from week 13 through week 27. This is also called months 4 through 6 of pregnancy. This is often the time when you feel your best. During the second trimester:  Morning sickness is less or has stopped.  You may have more energy.  You may feel hungry more often. At this time, your unborn baby (fetus) is growing very fast. At the end of the sixth month, the unborn baby may be up to 12 inches long and weigh about 1 pounds. You will likely start to feel the baby move between 16 and 20 weeks of pregnancy. Body changes during your second trimester Your body continues to go through many changes during this time. The changes vary and generally return to normal after the baby is born. Physical changes  You will gain more weight.  You may start to get stretch marks on your hips, belly (abdomen), and breasts.  Your breasts will grow and may hurt.  Dark spots or blotches may develop on your face.  A dark line from your belly button to the pubic area (linea nigra) may appear.  You may have changes in your hair. Health changes  You may have headaches.  You may have heartburn.  You may have trouble pooping (constipation).  You may have hemorrhoids or swollen, bulging veins (varicose veins).  Your gums may bleed.  You may pee (urinate) more often.  You may have back pain. Follow these instructions at home: Medicines  Take over-the-counter and prescription medicines only as told by your doctor. Some medicines are not safe during pregnancy.  Take a prenatal vitamin that contains at least 600 micrograms (mcg) of folic acid. Eating and drinking  Eat healthy meals that include: ? Fresh fruits and vegetables. ? Whole grains. ? Good sources of protein, such as meat, eggs, or tofu. ? Low-fat dairy products.  Avoid raw meat and unpasteurized juice, milk, and cheese.  You may need to take these actions to prevent or  treat trouble pooping: ? Drink enough fluids to keep your pee (urine) pale yellow. ? Eat foods that are high in fiber. These include beans, whole grains, and fresh fruits and vegetables. ? Limit foods that are high in fat and sugar. These include fried or sweet foods. Activity  Exercise only as told by your doctor. Most people can do their usual exercise during pregnancy. Try to exercise for 30 minutes at least 5 days a week.  Stop exercising if you have pain or cramps in your belly or lower back.  Do not exercise if it is too hot or too humid, or if you are in a place of great height (high altitude).  Avoid heavy lifting.  If you choose to, you may have sex unless your doctor tells you not to. Relieving pain and discomfort  Wear a good support bra if your breasts are sore.  Take warm water baths (sitz baths) to soothe pain or discomfort caused by hemorrhoids. Use hemorrhoid cream if your doctor approves.  Rest with your legs raised (elevated) if you have leg cramps or low back pain.  If you develop bulging veins in your legs: ? Wear support hose as told by your doctor. ? Raise your feet for 15 minutes, 3-4 times a day. ? Limit salt in your food. Safety  Wear your seat belt at all times when you are in a car.  Talk with your doctor if someone is hurting you or yelling  at you a lot. Lifestyle  Do not use hot tubs, steam rooms, or saunas.  Do not douche. Do not use tampons or scented sanitary pads.  Avoid cat litter boxes and soil used by cats. These carry germs that can harm your baby and can cause a loss of your baby by miscarriage or stillbirth.  Do not use herbal medicines, illegal drugs, or medicines that are not approved by your doctor. Do not drink alcohol.  Do not smoke or use any products that contain nicotine or tobacco. If you need help quitting, ask your doctor. General instructions  Keep all follow-up visits. This is important.  Ask your doctor about local  prenatal classes.  Ask your doctor about the right foods to eat or for help finding a counselor. Where to find more information  American Pregnancy Association: americanpregnancy.org  [employer]st[employer]ians and Gynecologists: www.acog.org  Office on [employer] Health: [employer].co.nz Contact a doctor if:  You have a headache that does not go away when you take medicine.  You have changes in how you see, or you see spots in front of your eyes.  You have mild cramps, pressure, or pain in your lower belly.  You continue to feel like you may vomit (nauseous), you vomit, or you have watery poop (diarrhea).  You have bad-smelling fluid coming from your vagina.  You have pain when you pee or your pee smells bad.  You have very bad swelling of your face, hands, ankles, feet, or legs.  You have a fever. Get help right away if:  You are leaking fluid from your vagina.  You have spotting or bleeding from your vagina.  You have very bad belly cramping or pain.  You have trouble breathing.  You have chest pain.  You faint.  You have not felt your baby move for the time period told by your doctor.  You have new or increased pain, swelling, or redness in an arm or leg. Summary  The second trimester of pregnancy is from week 13 through week 27 (months 4 through 6).  Eat healthy meals.  Exercise as told by your doctor. Most people can do their usual exercise during pregnancy.  Do not use herbal medicines, illegal drugs, or medicines that are not approved by your doctor. Do not drink alcohol.  Call your doctor if you get sick or if you notice anything unusual about your pregnancy. This information is not intended to replace advice given to you by your health care provider. Make sure you discuss any questions you have with your health care provider. Document Revised: 03/08/2020 Document Reviewed: 01/13/2020 Elsevier Patient Education  2021 Elsevier  Inc.   Round Ligament Pain  The round ligament is a cord of muscle and tissue that helps support the uterus. It can become a source of pain during pregnancy if it becomes stretched or twisted as the baby grows. The pain usually begins in the second trimester (13-28 weeks) of pregnancy, and it can come and go until the baby is delivered. It is not a serious problem, and it does not cause harm to the baby. Round ligament pain is usually a short, sharp, and pinching pain, but it can also be a dull, lingering, and aching pain. The pain is felt in the lower side of the abdomen or in the groin. It usually starts deep in the groin and moves up to the outside of the hip area. The pain may occur when you:  Suddenly change position, such  as quickly going from a sitting to standing position.  Roll over in bed.  Cough or sneeze.  Do physical activity. Follow these instructions at home:  Watch your condition for any changes.  When the pain starts, relax. Then try any of these methods to help with the pain: ? Sitting down. ? Flexing your knees up to your abdomen. ? Lying on your side with one pillow under your abdomen and another pillow between your legs. ? Sitting in a warm bath for 15-20 minutes or until the pain goes away.  Take over-the-counter and prescription medicines only as told by your health care provider.  Move slowly when you sit down or stand up.  Avoid long walks if they cause pain.  Stop or reduce your physical activities if they cause pain.  Keep all follow-up visits as told by your health care provider. This is important.   Contact a health care provider if:  Your pain does not go away with treatment.  You feel pain in your back that you did not have before.  Your medicine is not helping. Get help right away if:  You have a fever or chills.  You develop uterine contractions.  You have vaginal bleeding.  You have nausea or vomiting.  You have diarrhea.  You  have pain when you urinate. Summary  Round ligament pain is felt in the lower abdomen or groin. It is usually a short, sharp, and pinching pain. It can also be a dull, lingering, and aching pain.  This pain usually begins in the second trimester (13-28 weeks). It occurs because the uterus is stretching with the growing baby, and it is not harmful to the baby.  You may notice the pain when you suddenly change position, when you cough or sneeze, or during physical activity.  Relaxing, flexing your knees to your abdomen, lying on one side, or taking a warm bath may help to get rid of the pain.  Get help from your health care provider if the pain does not go away or if you have vaginal bleeding, nausea, vomiting, diarrhea, or painful urination. This information is not intended to replace advice given to you by your health care provider. Make sure you discuss any questions you have with your health care provider. Document Revised: 03/18/2018 Document Reviewed: 03/18/2018 Elsevier Patient Education  2021 [employer].

## 2021-02-14 ENCOUNTER — Other Ambulatory Visit: Payer: Self-pay

## 2021-02-14 ENCOUNTER — Encounter: Payer: Self-pay | Admitting: Certified Nurse Midwife

## 2021-02-14 ENCOUNTER — Telehealth: Payer: Self-pay | Admitting: Certified Nurse Midwife

## 2021-02-14 ENCOUNTER — Ambulatory Visit: Payer: BC Managed Care – PPO | Admitting: Certified Nurse Midwife

## 2021-02-14 VITALS — BP 123/80 | HR 101 | Wt 274.7 lb

## 2021-02-14 DIAGNOSIS — N898 Other specified noninflammatory disorders of vagina: Secondary | ICD-10-CM

## 2021-02-14 DIAGNOSIS — Z3402 Encounter for supervision of normal first pregnancy, second trimester: Secondary | ICD-10-CM

## 2021-02-14 DIAGNOSIS — Z3A20 20 weeks gestation of pregnancy: Secondary | ICD-10-CM

## 2021-02-14 NOTE — Telephone Encounter (Signed)
Patient called in and states she is [redacted] weeks pregnant.  When patient went to use the bathroom, she noticed brown discharge on the toilet paper.  Patient states this is the first time she has noticed the discharge and denies cramping.  Patient is very concerned and would like a response and wants to know if she needs to be seen.  Please advise.

## 2021-02-14 NOTE — Progress Notes (Signed)
Pt present for FHT, she experienced some brown spotting today with wiping and is very concerned about fetal well being. Pt states that she did a lot of walking the other day. States she had intercourse last week. Reassurance given . FHT 135. Audible fetal movement on doppler. Discussed vaginal bleeding vs spotting in pregnancy. Follow up as scheduled or prn.   Doreene Burke, CNM

## 2021-02-14 NOTE — Addendum Note (Signed)
Addended by: Tommie Raymond on: 02/14/2021 03:59 PM   Modules accepted: Orders

## 2021-02-14 NOTE — Progress Notes (Signed)
ROB: Patient is here today because she has concerns about brown discharge that started this morning.

## 2021-02-14 NOTE — Patient Instructions (Signed)
Vaginal Bleeding During Pregnancy, Second Trimester °A small amount of bleeding from the vagina is common during pregnancy. This kind of bleeding is also called spotting. Sometimes the bleeding is normal and is not a sign of problems. In some other cases, it is a sign of something serious. °In the second trimester, normal bleeding can happen: °· Because of changes in your blood vessels. °· When you have sex. °· When you have pelvic exams. °In the second trimester, some abnormal things can cause bleeding. These include: °· Infection or swelling. °· Growths in the lowest part of the womb (cervix). These growths are also called polyps. °· Problems of the placenta. The placenta can block the opening of the cervix. The placenta can also break away from the womb. °· Miscarriage. °· Early labor. °· The cervix that opens early, before labor. °· An egg that was not fertilized properly. This causes a type of pregnancy called molar pregnancy. °Tell your doctor right away if there is any bleeding from your vagina. °Follow these instructions at home: °Watch your bleeding °· Watch your condition for any changes. Let your doctor know if you are worried about something. °· Try to know what causes your bleeding. Ask yourself these questions: °? Does the bleeding start on its own? °? Does the bleeding start after something is done, such as sex or a pelvic exam? °· Use a diary to write down the things you see about your bleeding. Write in your diary: °? If the bleeding flows freely without stopping, or if it starts and stops, and then starts again. °? If the bleeding is heavy or light. °? How many pads you use in a day and how much blood is in them. °· Tell your doctor if you pass tissue. He or she may want to see it.   °Activity °· Follow your doctor's instructions about limiting your activities. Ask what activities are safe for you. °· Do not exercise or do activities that take a lot of effort until your doctor says that this is  safe. °· Do not have sex until your doctor says that this is safe. °· Do not lift anything that is heavier than 10 lb (4.5 kg), or the limit that you are told. °· If needed, make plans for someone to help with your normal activities. °Medicines °· Take over-the-counter and prescription medicines only as told by your doctor. °· Do not take aspirin. It can cause bleeding. °General instructions °· Do not use tampons. °· Do not douche. °· Keep all follow-up visits. °Contact a doctor if: °· You have bleeding in the vagina at any time during pregnancy. °· You have cramps. °· You have a fever that does not get better with medicine. °Get help right away if: °· You have very bad cramps in your back or belly (abdomen). °· You have contractions. °· You have chills. °· Your bleeding gets worse. °· You pass large clots or a lot of tissue from your vagina. °· You feel light-headed. °· You feel weak. °· You faint. °· You are leaking fluid from your vagina. °· You have a gush of fluid from your vagina. °Summary °· A small amount of bleeding during pregnancy is normal. But bleeding can be a sign of something serious. Tell your doctor right away about any bleeding from your vagina. °· Try to know what causes your bleeding. Does the bleeding occur on its own, or does it occur after something is done, such as sex or pelvic exams? °·   Follow your doctor's instructions about what activities you can do.  Keep all follow-up visits. This information is not intended to replace advice given to you by your health care provider. Make sure you discuss any questions you have with your health care provider. Document Revised: 06/22/2020 Document Reviewed: 06/22/2020 Elsevier Patient Education  2021 [employer].

## 2021-02-15 NOTE — Progress Notes (Signed)
ROB-Reports episode of nausea and vomiting last Monday and nausea without vomiting today. Discussed home treatment measures. Anatomy scan scheduled for 02/22/2021. Anesthesia consult scheduled for 03/20/2021 with Pre-Admit Testing. Anticipatory guidance regarding course of prenatal care. Reviewed red flag symptoms and when to call. RTC x 4 weeks for ROB with ANNIE or sooner if needed.

## 2021-02-16 ENCOUNTER — Telehealth: Payer: Self-pay | Admitting: Certified Nurse Midwife

## 2021-02-16 LAB — URINE CULTURE

## 2021-02-16 NOTE — Telephone Encounter (Signed)
New Message:  Pt states her lab results have been put in her MyChart and she wishes for someone to call her to discuss those with her

## 2021-02-22 ENCOUNTER — Other Ambulatory Visit: Payer: Self-pay

## 2021-02-22 ENCOUNTER — Ambulatory Visit
Admission: RE | Admit: 2021-02-22 | Discharge: 2021-02-22 | Disposition: A | Discharge status: Home / Self Care | Payer: BC Managed Care – PPO | Source: Ambulatory Visit | Attending: Certified Nurse Midwife | Admitting: Certified Nurse Midwife

## 2021-02-22 DIAGNOSIS — Z3A22 22 weeks gestation of pregnancy: Secondary | ICD-10-CM | POA: Diagnosis not present

## 2021-02-22 DIAGNOSIS — Z3A16 16 weeks gestation of pregnancy: Secondary | ICD-10-CM

## 2021-02-22 DIAGNOSIS — Z363 Encounter for antenatal screening for malformations: Secondary | ICD-10-CM | POA: Diagnosis not present

## 2021-02-22 DIAGNOSIS — Z3402 Encounter for supervision of normal first pregnancy, second trimester: Secondary | ICD-10-CM | POA: Insufficient documentation

## 2021-02-22 DIAGNOSIS — Z3492 Encounter for supervision of normal pregnancy, unspecified, second trimester: Secondary | ICD-10-CM | POA: Diagnosis not present

## 2021-02-27 ENCOUNTER — Other Ambulatory Visit: Payer: Self-pay | Admitting: Certified Nurse Midwife

## 2021-02-27 DIAGNOSIS — Z3689 Encounter for other specified antenatal screening: Secondary | ICD-10-CM

## 2021-02-27 DIAGNOSIS — O99212 Obesity complicating pregnancy, second trimester: Secondary | ICD-10-CM

## 2021-03-08 ENCOUNTER — Ambulatory Visit: Payer: BC Managed Care – PPO | Admitting: Dermatology

## 2021-03-08 ENCOUNTER — Other Ambulatory Visit: Payer: Self-pay

## 2021-03-08 DIAGNOSIS — L089 Local infection of the skin and subcutaneous tissue, unspecified: Secondary | ICD-10-CM

## 2021-03-08 MED ORDER — MUPIROCIN 2 % EX OINT
1.0000 | TOPICAL_OINTMENT | Freq: Three times a day (TID) | CUTANEOUS | 1 refills | Status: DC
Start: 2021-03-08 — End: 2021-03-22

## 2021-03-08 MED ORDER — CEPHALEXIN 500 MG PO CAPS: 500.0000 mg | ORAL_CAPSULE | Freq: Three times a day (TID) | ORAL | 1 refills | Status: AC

## 2021-03-08 NOTE — Patient Instructions (Addendum)
Dr. Neale Burly cell phone [phone]. If you need anything, try paging first since pages will go through even in do not disturb mode.  If you have any questions or concerns for your doctor, please call our main line at [phone] and press option 4 to reach your doctor's medical assistant. If no one answers, please leave a voicemail as directed and we will return your call as soon as possible. Messages left after 4 pm will be answered the following business day.   You may also send [place] a message via MyChart. We typically respond to MyChart messages within 1-2 business days.  For prescription refills, please ask your pharmacy to contact our office. Our fax number is [phone].  If you have an urgent issue when the clinic is closed that cannot wait until the next business day, you can page your doctor at the number below.    Please note that while we do our best to be available for urgent issues outside of office hours, we are not available 24/7.   If you have an urgent issue and are unable to reach [place], you may choose to seek medical care at your doctor's office, retail clinic, urgent care center, or emergency room.  If you have a medical emergency, please immediately call 911 or go to the emergency department.  Pager Numbers  - Dr. Gwen Pounds: [phone]  - Dr. Neale Burly: [phone]  - Dr. Roseanne Reno: [phone]  In the event of inclement weather, please call our main line at [phone] for an update on the status of any delays or closures.  Dermatology Medication Tips: Please keep the boxes that topical medications come in in order to help keep track of the instructions about where and how to use these. Pharmacies typically print the medication instructions only on the boxes and not directly on the medication tubes.   If your medication is too expensive, please contact our office at [phone] option 4 or send [place] a message through MyChart.   We are unable to tell what your co-pay for  medications will be in advance as this is different depending on your insurance coverage. However, we may be able to find a substitute medication at lower cost or fill out paperwork to get insurance to cover a needed medication.   If a prior authorization is required to get your medication covered by your insurance company, please allow [place] 1-2 business days to complete this process.  Drug prices often vary depending on where the prescription is filled and some pharmacies may offer cheaper prices.  The website www.goodrx.com contains coupons for medications through different pharmacies. The prices here do not account for what the cost may be with help from insurance (it may be cheaper with your insurance), but the website can give you the price if you did not use any insurance.  - You can print the associated coupon and take it with your prescription to the pharmacy.  - You may also stop by our office during regular business hours and pick up a GoodRx coupon card.  - If you need your prescription sent electronically to a different pharmacy, notify our office through [facility] or by phone at [phone] option 4.

## 2021-03-08 NOTE — Progress Notes (Signed)
   Follow-Up Visit   Subjective  Hannah Stevens is a 30 y.o. female who presents for the following: Trauma (Patient cut self shaving on right lower leg  last Thursday. Patient used antibiotic and bandage on area. Patient has pictures. Area looks red and inflamed. ).  Patient is [redacted] weeks pregnant.   The following portions of the chart were reviewed this encounter and updated as appropriate:  Tobacco  Allergies  Meds  Problems  Med Hx  Surg Hx  Fam Hx      Objective  Well appearing patient in no apparent distress; mood and affect are within normal limits.  A focused examination was performed including right lower leg. Relevant physical exam findings are noted in the Assessment and Plan.  Objective  right lower leg: Open wound with surrounding erythema, tender to palpation  Assessment & Plan  Local infection of skin and subcutaneous tissue right lower leg  Anaerobic/aerobic/gram stain culture collected in office today   Patient pregnant  Sent to [facility]  Start Mupirocin ointment 2% apply 1 application topically 3 times daily to affected area of right leg and cover with bandage daily.   Start Cephalexin 500 mg capsule - take 1 capsule by mouth 3 times daily for 5 days.    Anaerobic/Aerobic/Gram Stain - right lower leg  mupirocin ointment (BACTROBAN) 2 % - right lower leg  cephALEXin (KEFLEX) 500 MG capsule - right lower leg  No follow-ups on file.  I, Asher Muir, CMA, am acting as scribe for Darden Dates, MD.  Documentation: I have reviewed the above documentation for accuracy and completeness, and I agree with the above.  Darden Dates, MD

## 2021-03-12 ENCOUNTER — Encounter: Payer: Self-pay | Admitting: Dermatology

## 2021-03-13 ENCOUNTER — Encounter: Payer: Self-pay | Admitting: Certified Nurse Midwife

## 2021-03-13 ENCOUNTER — Other Ambulatory Visit: Payer: Self-pay

## 2021-03-13 ENCOUNTER — Ambulatory Visit (INDEPENDENT_AMBULATORY_CARE_PROVIDER_SITE_OTHER): Payer: BC Managed Care – PPO | Admitting: Certified Nurse Midwife

## 2021-03-13 VITALS — BP 116/72 | HR 91 | Wt 280.0 lb

## 2021-03-13 DIAGNOSIS — Z3A24 24 weeks gestation of pregnancy: Secondary | ICD-10-CM

## 2021-03-13 DIAGNOSIS — Z3403 Encounter for supervision of normal first pregnancy, third trimester: Secondary | ICD-10-CM

## 2021-03-13 LAB — POCT URINALYSIS DIPSTICK OB
Bilirubin, UA: NEGATIVE
Blood, UA: NEGATIVE
Glucose, UA: NEGATIVE
Ketones, UA: NEGATIVE
Leukocytes, UA: NEGATIVE
Nitrite, UA: NEGATIVE
Spec Grav, UA: 1.015 (ref 1.010–1.025)
Urobilinogen, UA: 0.2 E.U./dL
pH, UA: 6.5 (ref 5.0–8.0)

## 2021-03-13 NOTE — Progress Notes (Signed)
ROB: She has concerns about her urine smelling strong and a possible skin tag on there right breast.

## 2021-03-13 NOTE — Progress Notes (Signed)
ROB doing well. Feels good movement. Has completion of anatomy scan scheduled for 6/9 with MFM. Discussed growth /AFI q 4 wks starting 32 wks. She verbalizes understanding.  Sking tag on her right areola that has enlarged since pregnancy. Discussed pregnancy hormones in relationship to skin changes, reassurance given. She cut her leg shaving and went to dermatologist, is on antibiotics for treatment. Discussed transition of care with Marcelino Duster leaving and option to see MD/s for 1-2 visit to meet them in case they are present for her delivery. She agrees and is interested in this option. Discussed visit at 30 wks . Follow up 4 wks for glucose screen.    Doreene Burke, CNM

## 2021-03-13 NOTE — Patient Instructions (Signed)
Oral Glucose Tolerance Test During Pregnancy Why am I having this test? The oral glucose tolerance test (OGTT) is done to check how your body processes blood sugar (glucose). This is one of several tests used to diagnose diabetes that develops during pregnancy (gestational diabetes mellitus). Gestational diabetes is a short-term form of diabetes that some women develop while they are pregnant. It usually occurs during the second trimester of pregnancy and goes away after delivery. Testing, or screening, for gestational diabetes usually occurs at weeks 24-28 of pregnancy. You may have the OGTT test after having a 1-hour glucose screening test if the results from that test indicate that you may have gestational diabetes. This test may also be needed if:  You have a history of gestational diabetes.  There is a history of giving birth to very large babies or of losing pregnancies (having stillbirths).  You have signs and symptoms of diabetes, such as: ? Changes in your eyesight. ? Tingling or numbness in your hands or feet. ? Changes in hunger, thirst, and urination, and these are not explained by your pregnancy. What is being tested? This test measures the amount of glucose in your blood at different times during a period of 3 hours. This shows how well your body can process glucose. What kind of sample is taken? Blood samples are required for this test. They are usually collected by inserting a needle into a blood vessel.   How do I prepare for this test?  For 3 days before your test, eat normally. Have plenty of carbohydrate-rich foods.  Follow instructions from your health care provider about: ? Eating or drinking restrictions on the day of the test. You may be asked not to eat or drink anything other than water (to fast) starting 8-10 hours before the test. ? Changing or stopping your regular medicines. Some medicines may interfere with this test. Tell a health care provider about:  All  medicines you are taking, including vitamins, herbs, eye drops, creams, and over-the-counter medicines.  Any blood disorders you have.  Any surgeries you have had.  Any medical conditions you have. What happens during the test? First, your blood glucose will be measured. This is referred to as your fasting blood glucose because you fasted before the test. Then, you will drink a glucose solution that contains a certain amount of glucose. Your blood glucose will be measured again 1, 2, and 3 hours after you drink the solution. This test takes about 3 hours to complete. You will need to stay at the testing location during this time. During the testing period:  Do not eat or drink anything other than the glucose solution.  Do not exercise.  Do not use any products that contain nicotine or tobacco, such as cigarettes, e-cigarettes, and chewing tobacco. These can affect your test results. If you need help quitting, ask your health care provider. The testing procedure may vary among health care providers and hospitals. How are the results reported? Your results will be reported as milligrams of glucose per deciliter of blood (mg/dL) or millimoles per liter (mmol/L). There is more than one source for screening and diagnosis reference values used to diagnose gestational diabetes. Your health care provider will compare your results to normal values that were established after testing a large group of people (reference values). Reference values may vary among labs and hospitals. For this test (Carpenter-Coustan), reference values are:  Fasting: 95 mg/dL (5.3 mmol/L).  1 hour: 180 mg/dL (10.0 mmol/L).  2 hour:   155 mg/dL (8.6 mmol/L).  3 hour: 140 mg/dL (7.8 mmol/L). What do the results mean? Results below the reference values are considered normal. If two or more of your blood glucose levels are at or above the reference values, you may be diagnosed with gestational diabetes. If only one level is  high, your health care provider may suggest repeat testing or other tests to confirm a diagnosis. Talk with your health care provider about what your results mean. Questions to ask your health care provider Ask your health care provider, or the department that is doing the test:  When will my results be ready?  How will I get my results?  What are my treatment options?  What other tests do I need?  What are my next steps? Summary  The oral glucose tolerance test (OGTT) is one of several tests used to diagnose diabetes that develops during pregnancy (gestational diabetes mellitus). Gestational diabetes is a short-term form of diabetes that some women develop while they are pregnant.  You may have the OGTT test after having a 1-hour glucose screening test if the results from that test show that you may have gestational diabetes. You may also have this test if you have any symptoms or risk factors for this type of diabetes.  Talk with your health care provider about what your results mean. This information is not intended to replace advice given to you by your health care provider. Make sure you discuss any questions you have with your health care provider. Document Revised: 03/09/2020 Document Reviewed: 03/09/2020 Elsevier Patient Education  2021 Elsevier Inc.  

## 2021-03-16 LAB — ANAEROBIC/AEROBIC/GRAM STAIN: Result 2: NONE SEEN

## 2021-03-20 ENCOUNTER — Encounter
Admission: RE | Admit: 2021-03-20 | Discharge: 2021-03-20 | Disposition: A | Discharge status: Home / Self Care | Payer: BC Managed Care – PPO | Source: Ambulatory Visit | Attending: Anesthesiology | Admitting: Anesthesiology

## 2021-03-20 ENCOUNTER — Other Ambulatory Visit: Payer: Self-pay

## 2021-03-20 NOTE — Consult Note (Signed)
[facility] Anesthesia Consultation  [name]:[id number] DOB: [birth year] DOA: 03/20/2021 PCP: Carlean Jews, NP   Requesting physician: Doreene Burke, CNM Date of consultation: 03/20/21 Reason for consultation: Obesity during pregnancy  CHIEF COMPLAINT:  Obesity during pregnancy  HISTORY OF PRESENT ILLNESS: Hannah Stevens  is a 30 y.o. female with a known history of obesity during pregnany. This is her first pregnancy. Intermittent asthma as a teenager. Denies hx of cardiovascular disease. Denies personal or family hx of bleeding disorders.   PAST MEDICAL HISTORY:   Past Medical History:  Diagnosis Date  . Anxiety   . Ovarian cyst   . Pelvic pain   . Vaccine for human papilloma virus (HPV) types 6, 11, 16, and 18 administered     PAST SURGICAL HISTORY:  Past Surgical History:  Procedure Laterality Date  . TONSILLECTOMY    . WISDOM TOOTH EXTRACTION      SOCIAL HISTORY:  Social History   Tobacco Use  . Smoking status: Former Smoker    Years: 1.00    Quit date: 10/14/2014    Years since quitting: 6.4  . Smokeless tobacco: Never Used  Substance Use Topics  . Alcohol use: Not Currently    Alcohol/week: 0.0 standard drinks    Comment: occasionally    FAMILY HISTORY:  Family History  Problem Relation Age of Onset  . Healthy Mother   . Diabetes Father     DRUG ALLERGIES: No Known Allergies  REVIEW OF SYSTEMS:   RESPIRATORY: No cough, shortness of breath, wheezing.  CARDIOVASCULAR: No chest pain, orthopnea, edema.  HEMATOLOGY: No anemia, easy bruising or bleeding SKIN: No rash or lesion. NEUROLOGIC: No tingling, numbness, weakness.  PSYCHIATRY: No anxiety or depression.   MEDICATIONS AT HOME:  Prior to Admission medications   Medication Sig Start Date End Date Taking? Authorizing Provider  aspirin EC 81 MG tablet Take 1 tablet (81 mg total) by mouth daily. Take after 12 weeks for prevention of preeclampssia later in  pregnancy 12/15/20   Lawhorn, Vanessa [place], CNM  FIBER ADULT GUMMIES PO Take by mouth.    [provider]  mupirocin ointment (BACTROBAN) 2 % Apply 1 application topically 3 (three) times daily. Apply to affected area of right leg cover with bandage daily 03/08/21   Moy[place], [place], MD  Prenatal Multivit-Min-Fe-FA (PRE-NATAL PO) Take by mouth daily.     [provider]      PHYSICAL EXAMINATION:   VITAL SIGNS: Last menstrual period 09/22/2020.  GENERAL:  30 y.o.-year-old patient no acute distress.  HEENT: Head atraumatic, normocephalic. Oropharynx and nasopharynx clear. MP 1, TM distance >3 cm, normal mouth opening. LUNGS: No use of accessory muscles of respiration.   EXTREMITIES: No pedal edema, cyanosis, or clubbing.  NEUROLOGIC: normal gait PSYCHIATRIC: The patient is alert and oriented x 3.  SKIN: No obvious rash, lesion, or ulcer.    IMPRESSION AND PLAN:   Hannah Stevens  is a 30 y.o. female presenting with obesity during pregnancy. BMI is currently 49 at [redacted] weeks gestation.   Airway exam reassuring. Spinal interspaces minimally palpable.   We discussed analgesic options during labor including epidural analgesia. Discussed that in obesity there can be increased difficulty with epidural placement or even failure of successful epidural. We also discussed that even after successful epidural placement there is increased risk of catheter migration out of the epidural space that would require catheter replacement. Discussed use of epidural vs spinal vs GA if cesarean delivery is required. Discussed  increased risk of difficult intubation during pregnancy should an emergency cesarean delivery be required.   Plan for delivery at [facility].

## 2021-03-21 ENCOUNTER — Telehealth: Payer: Self-pay

## 2021-03-21 NOTE — Telephone Encounter (Signed)
Called and informed patient of results. Patient reports she is doing well and there is no redness or pain. Patient notified if she is doing well no further treatment needed. She denied further questions and verbalized understanding.

## 2021-03-21 NOTE — Telephone Encounter (Signed)
-----   Message from [name], MD sent at 03/21/2021  8:16 AM EDT ----- No growth of bacteria from wound at leg. Please call patient to ensure that she is doing well with no pain or persistent redness spreading from her wound. As long as she is doing well, no additional treatment needed. Thank you!

## 2021-03-22 ENCOUNTER — Ambulatory Visit: Payer: BC Managed Care – PPO | Attending: Maternal & Fetal Medicine

## 2021-03-22 ENCOUNTER — Other Ambulatory Visit: Payer: Self-pay

## 2021-03-22 ENCOUNTER — Ambulatory Visit: Payer: BC Managed Care – PPO

## 2021-03-22 DIAGNOSIS — Z3A26 26 weeks gestation of pregnancy: Secondary | ICD-10-CM

## 2021-03-22 DIAGNOSIS — Z3689 Encounter for other specified antenatal screening: Secondary | ICD-10-CM | POA: Insufficient documentation

## 2021-03-22 DIAGNOSIS — O99212 Obesity complicating pregnancy, second trimester: Secondary | ICD-10-CM | POA: Diagnosis not present

## 2021-03-22 DIAGNOSIS — E669 Obesity, unspecified: Secondary | ICD-10-CM

## 2021-03-28 DIAGNOSIS — M5489 Other dorsalgia: Secondary | ICD-10-CM | POA: Diagnosis not present

## 2021-03-29 DIAGNOSIS — R609 Edema, unspecified: Secondary | ICD-10-CM | POA: Diagnosis not present

## 2021-04-10 ENCOUNTER — Ambulatory Visit (INDEPENDENT_AMBULATORY_CARE_PROVIDER_SITE_OTHER): Payer: BC Managed Care – PPO | Admitting: Certified Nurse Midwife

## 2021-04-10 ENCOUNTER — Other Ambulatory Visit: Payer: BC Managed Care – PPO

## 2021-04-10 ENCOUNTER — Other Ambulatory Visit: Payer: Self-pay

## 2021-04-10 VITALS — BP 138/80 | HR 99 | Wt 279.8 lb

## 2021-04-10 DIAGNOSIS — Z23 Encounter for immunization: Secondary | ICD-10-CM

## 2021-04-10 DIAGNOSIS — Z3A28 28 weeks gestation of pregnancy: Secondary | ICD-10-CM | POA: Diagnosis not present

## 2021-04-10 DIAGNOSIS — Z3403 Encounter for supervision of normal first pregnancy, third trimester: Secondary | ICD-10-CM

## 2021-04-10 LAB — POCT URINALYSIS DIPSTICK OB
Bilirubin, UA: NEGATIVE
Blood, UA: NEGATIVE
Glucose, UA: NEGATIVE
Leukocytes, UA: NEGATIVE
Nitrite, UA: NEGATIVE
POC,PROTEIN,UA: NEGATIVE
Spec Grav, UA: 1.015 (ref 1.010–1.025)
Urobilinogen, UA: 0.2 E.U./dL
pH, UA: 6 (ref 5.0–8.0)

## 2021-04-10 NOTE — Progress Notes (Addendum)
ROB doing well. Feels good movement. 28 wk labs today: Glucose screen/RPR/CBC. Tdap not done she declines.  Blood transfusion consent completed, all questions answered. Ready set baby reviewed, see check list for topics covered. Birth plan discussed. She wants a natural un medicated birth if possible. Discussed birth control after delivery, information pamphlet given. She states she is not wanting BC .She has repeat growth scan with MFM on July 21st. Discussed seeing MD for visit for a meet them in the case they may be present for her delivery.  She agrees and will see them for her next visit.    Follow up 2 wk with MD for ROB or sooner if needed.    Doreene Burke, CNM

## 2021-04-10 NOTE — Patient Instructions (Signed)
Td (Tetanus, Diphtheria) Vaccine: What You Need to Know 1. Why get vaccinated? Td vaccine can prevent tetanus and diphtheria. Tetanus enters the body through cuts or wounds. Diphtheria spreads from person to person. TETANUS (T) causes painful stiffening of the muscles. Tetanus can lead to serious health problems, including being unable to open the mouth, having trouble swallowing and breathing, or death. DIPHTHERIA (D) can lead to difficulty breathing, heart failure, paralysis, or death. 2. Td vaccine Td is only for children 7 years and older, adolescents, and adults.  Td is usually given as a booster dose every 10 years, or after 5 years in the case of a severe or dirty wound or burn. Another vaccine, called "Tdap," may be used instead of Td. Tdap protects against pertussis, also known as "whooping cough," in addition to tetanus anddiphtheria. Td may be given at the same time as other vaccines. 3. Talk with your health care provider Tell your vaccination provider if the person getting the vaccine: Has had an allergic reaction after a previous dose of any vaccine that protects against tetanus or diphtheria, or has any severe, life-threatening allergies Has ever had Guillain-Barr Syndrome (also called "GBS") Has had severe pain or swelling after a previous dose of any vaccine that protects against tetanus or diphtheria In some cases, your health care provider may decide to postpone Td vaccinationuntil a future visit. People with minor illnesses, such as a cold, may be vaccinated. People who are moderately or severely ill should usually wait until they recover beforegetting Td vaccine.  Your health care provider can give you more information. 4. Risks of a vaccine reaction Pain, redness, or swelling where the shot was given, mild fever, headache, feeling tired, and nausea, vomiting, diarrhea, or stomachache sometimes happen after Td vaccination. People sometimes faint after medical procedures,  including vaccination. Tellyour provider if you feel dizzy or have vision changes or ringing in the ears.  As with any medicine, there is a very remote chance of a vaccine causing asevere allergic reaction, other serious injury, or death. 5. What if there is a serious problem? An allergic reaction could occur after the vaccinated person leaves the clinic. If you see signs of a severe allergic reaction (hives, swelling of the face and throat, difficulty breathing, a fast heartbeat, dizziness, or weakness), call 9-1-1and get the person to the nearest hospital.  For other signs that concern you, call your health care provider.  Adverse reactions should be reported to the Vaccine Adverse Event Reporting System (VAERS). Your health care provider will usually file this report, or you can do it yourself. Visit the VAERS website at www.vaers.hhs.gov or call 1-800-822-7967. VAERS is only for reporting reactions, and VAERS staff members do not give medical advice. 6. The National Vaccine Injury Compensation Program The National Vaccine Injury Compensation Program (VICP) is a federal program that was created to compensate people who may have been injured by certain vaccines. Claims regarding alleged injury or death due to vaccination have a time limit for filing, which may be as short as two years. Visit the VICP website at www.hrsa.gov/vaccinecompensation or call 1-800-338-2382to learn about the program and about filing a claim. 7. How can I learn more? Ask your health care provider. Call your local or state health department. Visit the website of the Food and Drug Administration (FDA) for vaccine package inserts and additional information at www.fda.gov/vaccines-blood-biologics/vaccines. Contact the Centers for Disease Control and Prevention (CDC): Call 1-800-232-4636 (1-800-CDC-INFO) or Visit CDC's website at www.cdc.gov/vaccines. Vaccine Information Statement   Td (Tetanus, Diphtheria) Vaccine (05/19/2020) This  information is not intended to replace advice given to you by your health care provider. Make sure you discuss any questions you have with your healthcare provider. Document Revised: 07/06/2020 Document Reviewed: 07/06/2020 Elsevier Patient Education  2022 Elsevier Inc.  

## 2021-04-11 LAB — CBC
Hematocrit: 38.1 % (ref 34.0–46.6)
Hemoglobin: 13.1 g/dL (ref 11.1–15.9)
MCH: 29.5 pg (ref 26.6–33.0)
MCHC: 34.4 g/dL (ref 31.5–35.7)
MCV: 86 fL (ref 79–97)
Platelets: 310 10*3/uL (ref 150–450)
RBC: 4.44 x10E6/uL (ref 3.77–5.28)
RDW: 12 % (ref 11.7–15.4)
WBC: 10.7 10*3/uL (ref 3.4–10.8)

## 2021-04-11 LAB — RPR: RPR Ser Ql: NONREACTIVE

## 2021-04-11 LAB — GLUCOSE, 1 HOUR GESTATIONAL: Gestational Diabetes Screen: 74 mg/dL (ref 65–139)

## 2021-04-18 ENCOUNTER — Telehealth: Payer: BC Managed Care – PPO | Admitting: Nurse Practitioner

## 2021-04-18 ENCOUNTER — Encounter: Payer: Self-pay | Admitting: Nurse Practitioner

## 2021-04-18 VITALS — Resp 16 | Ht 63.0 in | Wt 279.0 lb

## 2021-04-18 DIAGNOSIS — J029 Acute pharyngitis, unspecified: Secondary | ICD-10-CM | POA: Diagnosis not present

## 2021-04-18 DIAGNOSIS — Z3A30 30 weeks gestation of pregnancy: Secondary | ICD-10-CM

## 2021-04-18 MED ORDER — AMOXICILLIN 500 MG PO CAPS: 500.0000 mg | ORAL_CAPSULE | Freq: Two times a day (BID) | ORAL | 0 refills | Status: DC

## 2021-04-18 NOTE — Progress Notes (Signed)
[facility] [address] [place], [place] [address]  Internal MEDICINE  Telephone Visit  Patient Name: Hannah Stevens  [id number]  [id number]  Date of Service: 04/25/2021  I connected with the patient at 12:05 PM by telephone and verified the patients identity using two identifiers.   I discussed the limitations, risks, security and privacy concerns of performing an evaluation and management service by telephone and the availability of in person appointments. I also discussed with the patient that there may be a patient responsible charge related to the service.  The patient expressed understanding and agrees to proceed.    Chief Complaint  Patient presents with  . Acute Visit    Sore throat started yesterday, 2 neg covid tests, congestion, green mucus, pt is pregnant, pt says it feels like strep  . Telephone Assessment    Video call  . Telephone Screen    [phone]    HPI Hannah Stevens presents for virtual video visit for acute sore throat started yesterday, she has nasal congestion, green nasal drainage, feels like it is strep throat. She has had 2 negative COVID tests. She is approximately [redacted] weeks pregnant. She reports that it "feels like strep"     Current Medication: Outpatient Encounter Medications as of 04/18/2021  Medication Sig  . aspirin EC 81 MG tablet Take 1 tablet (81 mg total) by mouth daily. Take after 12 weeks for prevention of preeclampssia later in pregnancy  . FIBER ADULT GUMMIES PO Take by mouth.  . Prenatal Multivit-Min-Fe-FA (PRE-NATAL PO) Take by mouth daily.   . [DISCONTINUED] amoxicillin (AMOXIL) 500 MG capsule Take 1 capsule (500 mg total) by mouth 2 (two) times daily for 10 days. (Patient not taking: Reported on 04/24/2021)   No facility-administered encounter medications on file as of 04/18/2021.    Surgical History: Past Surgical History:  Procedure Laterality Date  . TONSILLECTOMY    . WISDOM TOOTH EXTRACTION      Medical History: Past  Medical History:  Diagnosis Date  . Anxiety   . Ovarian cyst   . Pelvic pain   . Vaccine for human papilloma virus (HPV) types 6, 11, 16, and 18 administered     Family History: Family History  Problem Relation Age of Onset  . Diabetes Father     Social History   Socioeconomic History  . Marital status: Married    Spouse name: Not on file  . Number of children: Not on file  . Years of education: Not on file  . Highest education level: Not on file  Occupational History  . Not on file  Tobacco Use  . Smoking status: Former    Years: 1.00    Pack years: 0.00    Types: Cigarettes    Quit date: 10/14/2014    Years since quitting: 6.5  . Smokeless tobacco: Never  Vaping Use  . Vaping Use: Never used  Substance and Sexual Activity  . Alcohol use: Not Currently    Alcohol/week: 0.0 standard drinks    Comment: occasionally  . Drug use: No  . Sexual activity: Yes  Other Topics Concern  . Not on file  Social History Narrative  . Not on file   Social Determinants of Health   Financial Resource Strain: Not on file  Food Insecurity: Not on file  Transportation Needs: Not on file  Physical Activity: Not on file  Stress: Not on file  Social Connections: Not on file  Intimate Partner Violence: Not on file  Review of Systems  Constitutional:  Positive for chills and fatigue. Negative for fever.  HENT:  Positive for congestion, postnasal drip, rhinorrhea, sore throat and trouble swallowing.   Respiratory:  Positive for cough. Negative for chest tightness, shortness of breath and wheezing.   Gastrointestinal:  Negative for abdominal pain, constipation, diarrhea, nausea and vomiting.   Vital Signs: Resp 16   Ht 5\' 3"  (1.6 m)   Wt 279 lb (126.6 kg)   LMP 09/22/2020   BMI 49.42 kg/m    Observation/Objective: She is alert and oriented, engages in conversation appropriately. She does not appear to be in any acute distress over video call.    Assessment/Plan: 1.  Pharyngitis, unspecified etiology Unable to perform rapid strep test due to telehealth visit. Empirically treated for strep pharyngitis with amoxicillin 500 mg twice daily for 10 days.   2. [redacted] weeks gestation of pregnancy Managed by OBGYN   General Counseling: Avrianna verbalizes understanding of the findings of today's phone visit and agrees with plan of treatment. I have discussed any further diagnostic evaluation that may be needed or ordered today. We also reviewed her medications today. she has been encouraged to call the office with any questions or concerns that should arise related to todays visit.    No orders of the defined types were placed in this encounter.   Meds ordered this encounter  Medications  . DISCONTD: amoxicillin (AMOXIL) 500 MG capsule    Sig: Take 1 capsule (500 mg total) by mouth 2 (two) times daily for 10 days.    Dispense:  20 capsule    Refill:  0    Time spent:20 Minutes  Return if symptoms worsen or fail to improve.  This patient was seen by 14/07/2020, FNP-C in collaboration with Dr. Sallyanne Kuster as a part of collaborative care agreement.    Hannah Stevens R. Beverely Risen, MSN, FNP-C Internal medicine

## 2021-04-24 ENCOUNTER — Ambulatory Visit (INDEPENDENT_AMBULATORY_CARE_PROVIDER_SITE_OTHER): Payer: BC Managed Care – PPO | Admitting: Obstetrics and Gynecology

## 2021-04-24 ENCOUNTER — Other Ambulatory Visit: Payer: Self-pay

## 2021-04-24 ENCOUNTER — Encounter: Payer: Self-pay | Admitting: Obstetrics and Gynecology

## 2021-04-24 VITALS — BP 132/83 | HR 99 | Wt 282.6 lb

## 2021-04-24 DIAGNOSIS — Z3403 Encounter for supervision of normal first pregnancy, third trimester: Secondary | ICD-10-CM

## 2021-04-24 DIAGNOSIS — Z3A30 30 weeks gestation of pregnancy: Secondary | ICD-10-CM

## 2021-04-24 DIAGNOSIS — O99210 Obesity complicating pregnancy, unspecified trimester: Secondary | ICD-10-CM

## 2021-04-24 DIAGNOSIS — O368390 Maternal care for abnormalities of the fetal heart rate or rhythm, unspecified trimester, not applicable or unspecified: Secondary | ICD-10-CM

## 2021-04-24 LAB — POCT URINALYSIS DIPSTICK OB
Bilirubin, UA: NEGATIVE
Blood, UA: NEGATIVE
Glucose, UA: NEGATIVE
Ketones, UA: NEGATIVE
Leukocytes, UA: NEGATIVE
Nitrite, UA: NEGATIVE
POC,PROTEIN,UA: NEGATIVE
Spec Grav, UA: 1.015 (ref 1.010–1.025)
Urobilinogen, UA: 0.2 E.U./dL
pH, UA: 8 (ref 5.0–8.0)

## 2021-04-24 NOTE — Progress Notes (Signed)
OB-Pt present for routine prenatal care. Pt c/o ear pain, lower abd pain, vaginal pressure and hand/feet swelling along with braxton hick contractions.

## 2021-04-24 NOTE — Progress Notes (Signed)
ROB: Patient referred from midwifery service for "meet and greet". Patient c/o intermittent left ear discomfort x ~ 2 weeks. Also notes recently having strep throat infection ~ 1-2 weeks ago. Took antibiotics but did not complete as she was feeling better and does not like to take meds. Discussed comfort measures.  Also c/o worsening pelvic pressure. Discussed belly band. Has hand and feet swelling, is elevating feet in the evenings, trying not to over do it at work. FHT with irregular heart rhythm noted. Is scheduled for f/u growth scan in 1 week by MFM, to reassess then. Has plans to go to the beach this week. RTC in 2 weeks.

## 2021-04-30 ENCOUNTER — Other Ambulatory Visit: Payer: Self-pay

## 2021-04-30 ENCOUNTER — Telehealth: Payer: Self-pay | Admitting: Certified Nurse Midwife

## 2021-04-30 DIAGNOSIS — L049 Acute lymphadenitis, unspecified: Secondary | ICD-10-CM | POA: Diagnosis not present

## 2021-04-30 DIAGNOSIS — J029 Acute pharyngitis, unspecified: Secondary | ICD-10-CM

## 2021-04-30 MED ORDER — AZITHROMYCIN 250 MG PO TABS: ORAL_TABLET | ORAL | 0 refills | Status: AC

## 2021-04-30 NOTE — Telephone Encounter (Signed)
Spoke with patient in regards to [employer] and confirmed to patient per annie that her medication was safe to take. Told pt to reach out if symptoms worsen or to contact us with any further questions or concerns.

## 2021-04-30 NOTE — Telephone Encounter (Signed)
Pt called c/o her throat being really sore and lymph nodes swollen with a knot under her jaw bone on left side, pt not able to eat except liquids.  Was seen virtual visit with Alyssa on 04/18/21 and was given amoxicillin, she only took for a few days and started feeling better and was scared to keep taking bc she is [redacted] weeks pregnant.  Spoke to [facility] and she advised for [place] to send in a zpak and for pt to do herbal liquids and rinse with warm salt water and see if it helps if not then we need to bring in for appt.  I sent zpak to her pharmacy

## 2021-04-30 NOTE — Telephone Encounter (Signed)
Mariama had written the messages below through MyChart and called frustrated because her messages were read but not responded to.  I informed her that she will be responded to, and that Pattricia Boss was at lunch and will be back.  I copied and pasted both of her messages because I am not sure what you are able to see.   Lu Duffel, I reached out to my primary care doctor as well because they are the ones that prescribed me the amoxicillin when I was sick. They stated that they wanted me to stop taking the amoxicillin and start taking a Zpack today. Is this okay to do at [redacted] weeks pregnant? I've never been in this kind of pain before and it scares me and I don't know which way to turn. Please let me know what your thoughts are. Thanks.   Gaylord Shih to Doreene Burke, CNM   Good morning Pattricia Boss,   I wanted to reach out and send a message. I started to get sick on Tuesday July 5/6. Called into my primary doctor and they assumed it was strep but this was all through a virtual call and they did not see me in person. They gave me amoxicillin to help get better. I took this for 4 day and did not complete my medication (honestly was scared to take because of being pregnant so I stopped when I felt much better). Went to the beach this past week and on Wednesday July 13 my throat started to hurt again. Hurt really bad for 2 days. Then Saturday during the day I noticed that my lymph nodes on the left side of my neck were really swollen. I started back taking the amoxicillin hoping that it would help and I have taken Tylenol twice as well. They seem to just be getting worse. Every time I eat or drink anything it swells into the knot. It is so tender to the touch and hurts to move my neck. I haven't been able to eat a lot of solid foods and eating mainly liquids like oat meal because it's easier to swallow. I'm not really sure what to do but I know I'd like to be seen so that someone can see what I'm dealing with. I do plan  to call into the office this morning to try and get something scheduled but wanted to send you a message on here as well.

## 2021-05-01 ENCOUNTER — Other Ambulatory Visit: Payer: Self-pay

## 2021-05-01 ENCOUNTER — Ambulatory Visit: Payer: BC Managed Care – PPO | Admitting: Internal Medicine

## 2021-05-01 ENCOUNTER — Encounter: Payer: Self-pay | Admitting: Internal Medicine

## 2021-05-01 VITALS — BP 112/78 | HR 88 | Temp 98.1°F | Resp 16 | Ht 63.0 in | Wt 282.6 lb

## 2021-05-01 DIAGNOSIS — I889 Nonspecific lymphadenitis, unspecified: Secondary | ICD-10-CM | POA: Diagnosis not present

## 2021-05-01 DIAGNOSIS — K115 Sialolithiasis: Secondary | ICD-10-CM

## 2021-05-01 LAB — CBC WITH DIFFERENTIAL/PLATELET
Basophils Absolute: 0 10*3/uL (ref 0.0–0.2)
Basos: 0 %
EOS (ABSOLUTE): 0.1 10*3/uL (ref 0.0–0.4)
Eos: 1 %
Hematocrit: 35.7 % (ref 34.0–46.6)
Hemoglobin: 12.3 g/dL (ref 11.1–15.9)
Immature Grans (Abs): 0 10*3/uL (ref 0.0–0.1)
Immature Granulocytes: 0 %
Lymphocytes Absolute: 2 10*3/uL (ref 0.7–3.1)
Lymphs: 21 %
MCH: 29.5 pg (ref 26.6–33.0)
MCHC: 34.5 g/dL (ref 31.5–35.7)
MCV: 86 fL (ref 79–97)
Monocytes Absolute: 0.4 10*3/uL (ref 0.1–0.9)
Monocytes: 4 %
Neutrophils Absolute: 7.3 10*3/uL — ABNORMAL HIGH (ref 1.4–7.0)
Neutrophils: 74 %
Platelets: 312 10*3/uL (ref 150–450)
RBC: 4.17 x10E6/uL (ref 3.77–5.28)
RDW: 12.5 % (ref 11.7–15.4)
WBC: 9.8 10*3/uL (ref 3.4–10.8)

## 2021-05-01 NOTE — Progress Notes (Signed)
[facility] [address] [place], [place] [address]  Internal MEDICINE  Office Visit Note  Patient Name: Hannah Stevens  [id number]  [id number]  Date of Service: 05/01/2021  Chief Complaint  Patient presents with   Acute Visit    Swollen lyphnodes, couch that started this morning, congestion for about 6 days, left ear has also be hurting       HPI Pt is here for a sick visit. She was treated with Augmentin for acute sore throat on 7/6. Felt better then started noticing a lump on left side of her chin. It gets bigger when pt eats. It also gets painful.  She is pregnant in her 3rd trimester at the moment, no previous history of these types of incidence. Swelling is on left side of chin only, denies any fever or chills      Current Medication:  Outpatient Encounter Medications as of 05/01/2021  Medication Sig   aspirin EC 81 MG tablet Take 1 tablet (81 mg total) by mouth daily. Take after 12 weeks for prevention of preeclampssia later in pregnancy   azithromycin (ZITHROMAX) 250 MG tablet Take 2 tablets on day 1, then 1 tablet daily on days 2 through 5   FIBER ADULT GUMMIES PO Take by mouth.   Prenatal Multivit-Min-Fe-FA (PRE-NATAL PO) Take by mouth daily.    No facility-administered encounter medications on file as of 05/01/2021.      Medical History: Past Medical History:  Diagnosis Date   Anxiety    Ovarian cyst    Pelvic pain    Vaccine for human papilloma virus (HPV) types 6, 11, 16, and 18 administered      Vital Signs: BP 112/78   Pulse 88   Temp 98.1 F (36.7 C)   Resp 16   Ht 5\' 3"  (1.6 m)   Wt 282 lb 9.6 oz (128.2 kg)   LMP 09/22/2020   SpO2 96%   BMI 50.06 kg/m    Review of Systems  Constitutional:  Positive for chills. Negative for fatigue and fever.  HENT:  Positive for facial swelling. Negative for congestion, mouth sores and postnasal drip.   Respiratory:  Negative for cough.   Cardiovascular:  Negative for chest pain.   Genitourinary:  Negative for flank pain.  Psychiatric/Behavioral: Negative.     Physical Exam Constitutional:      Appearance: Normal appearance.  HENT:     Head: Normocephalic and atraumatic.     Nose: Nose normal.     Mouth/Throat:     Mouth: Mucous membranes are moist.     Pharynx: No posterior oropharyngeal erythema.     Comments: Swelling of salivary duct under tongue ( floor of mouth )  Eyes:     Extraocular Movements: Extraocular movements intact.     Pupils: Pupils are equal, round, and reactive to light.  Cardiovascular:     Pulses: Normal pulses.     Heart sounds: Normal heart sounds.  Pulmonary:     Effort: Pulmonary effort is normal.     Breath sounds: Normal breath sounds.  Neurological:     General: No focal deficit present.     Mental Status: She is alert.  Psychiatric:        Mood and Affect: Mood normal.        Behavior: Behavior normal.      Assessment/Plan: 1. Salivary calculus ?? History /PE leads to Left submandibular salivary gland pathology, will need to see ENT  - Ambulatory referral to  ENT  2. Submandibular lymphadenitis ?? Finish abx, drink fluids?? Sialolithiasis Left submandibular gland  - Ambulatory referral to ENT   General Counseling: Hannah Stevens verbalizes understanding of the findings of todays visit and agrees with plan of treatment. I have discussed any further diagnostic evaluation that may be needed or ordered today. We also reviewed her medications today. she has been encouraged to call the office with any questions or concerns that should arise related to todays visit.  Orders Placed This Encounter  Procedures   Ambulatory referral to ENT    No orders of the defined types were placed in this encounter.   [place] Controlled Substance Database was reviewed by me.  Time spent:30 Minutes

## 2021-05-02 ENCOUNTER — Other Ambulatory Visit: Payer: Self-pay | Admitting: Certified Nurse Midwife

## 2021-05-02 ENCOUNTER — Other Ambulatory Visit: Payer: Self-pay | Admitting: Maternal & Fetal Medicine

## 2021-05-02 DIAGNOSIS — O99213 Obesity complicating pregnancy, third trimester: Secondary | ICD-10-CM

## 2021-05-03 ENCOUNTER — Other Ambulatory Visit: Payer: Self-pay

## 2021-05-03 ENCOUNTER — Ambulatory Visit: Payer: BC Managed Care – PPO | Attending: Maternal & Fetal Medicine

## 2021-05-03 VITALS — BP 114/72 | HR 97 | Temp 98.4°F | Resp 18 | Ht 63.0 in | Wt 282.0 lb

## 2021-05-03 DIAGNOSIS — Z3A31 31 weeks gestation of pregnancy: Secondary | ICD-10-CM

## 2021-05-03 DIAGNOSIS — O0993 Supervision of high risk pregnancy, unspecified, third trimester: Secondary | ICD-10-CM

## 2021-05-03 DIAGNOSIS — O99213 Obesity complicating pregnancy, third trimester: Secondary | ICD-10-CM

## 2021-05-03 DIAGNOSIS — Z3A32 32 weeks gestation of pregnancy: Secondary | ICD-10-CM | POA: Diagnosis not present

## 2021-05-03 DIAGNOSIS — E669 Obesity, unspecified: Secondary | ICD-10-CM | POA: Diagnosis not present

## 2021-05-08 ENCOUNTER — Ambulatory Visit (INDEPENDENT_AMBULATORY_CARE_PROVIDER_SITE_OTHER): Payer: BC Managed Care – PPO | Admitting: Obstetrics and Gynecology

## 2021-05-08 ENCOUNTER — Other Ambulatory Visit: Payer: Self-pay

## 2021-05-08 ENCOUNTER — Encounter: Payer: Self-pay | Admitting: Obstetrics and Gynecology

## 2021-05-08 VITALS — BP 132/62 | HR 89 | Wt 284.1 lb

## 2021-05-08 DIAGNOSIS — Z3403 Encounter for supervision of normal first pregnancy, third trimester: Secondary | ICD-10-CM

## 2021-05-08 DIAGNOSIS — Z3A32 32 weeks gestation of pregnancy: Secondary | ICD-10-CM

## 2021-05-08 LAB — POCT URINALYSIS DIPSTICK OB
Bilirubin, UA: NEGATIVE
Blood, UA: NEGATIVE
Glucose, UA: NEGATIVE
Ketones, UA: NEGATIVE
Nitrite, UA: NEGATIVE
POC,PROTEIN,UA: NEGATIVE
Spec Grav, UA: <=1.005 — AB (ref 1.010–1.025)
Urobilinogen, UA: 0.2 E.U./dL
pH, UA: 8 (ref 5.0–8.0)

## 2021-05-08 NOTE — Progress Notes (Signed)
ROB: Patient has occasional right hip pain-discussed.  Fetal heart rate regularly irregular.  Patient states that at MFM last week he was regular.  We have discussed the irregular heartbeat and that usually as gestation progresses this corrects itself. Patient scheduled for 37-week MFM follow-up growth ultrasound.  Begin NSTs here at 36 weeks-weekly.  Early induction for elevated BMI discussed and all questions answered.

## 2021-05-17 DIAGNOSIS — K115 Sialolithiasis: Secondary | ICD-10-CM | POA: Diagnosis not present

## 2021-05-22 ENCOUNTER — Ambulatory Visit (INDEPENDENT_AMBULATORY_CARE_PROVIDER_SITE_OTHER): Payer: BC Managed Care – PPO | Admitting: Certified Nurse Midwife

## 2021-05-22 ENCOUNTER — Other Ambulatory Visit: Payer: Self-pay

## 2021-05-22 ENCOUNTER — Encounter: Payer: BC Managed Care – PPO | Admitting: Certified Nurse Midwife

## 2021-05-22 ENCOUNTER — Other Ambulatory Visit: Payer: BC Managed Care – PPO

## 2021-05-22 VITALS — BP 119/76 | HR 94 | Wt 286.3 lb

## 2021-05-22 DIAGNOSIS — O99213 Obesity complicating pregnancy, third trimester: Secondary | ICD-10-CM

## 2021-05-22 DIAGNOSIS — Z3A34 34 weeks gestation of pregnancy: Secondary | ICD-10-CM

## 2021-05-22 DIAGNOSIS — Z3403 Encounter for supervision of normal first pregnancy, third trimester: Secondary | ICD-10-CM

## 2021-05-22 LAB — POCT URINALYSIS DIPSTICK OB
Bilirubin, UA: NEGATIVE
Blood, UA: NEGATIVE
Glucose, UA: NEGATIVE
Ketones, UA: NEGATIVE
Leukocytes, UA: NEGATIVE
Nitrite, UA: NEGATIVE
POC,PROTEIN,UA: NEGATIVE
Spec Grav, UA: 1.01 (ref 1.010–1.025)
Urobilinogen, UA: 0.2 E.U./dL
pH, UA: 7.5 (ref 5.0–8.0)

## 2021-05-22 NOTE — Progress Notes (Signed)
ROB doing well, feeling good fetal movement. Has MFM u/s for growth on 8/27. Fetal heart rate arrythmia noted on doppler today. Pt states it was present at the last visit with Dr. Logan Bores. Reviewed irregular heart beat. Discussed that this can be furth evaluated at u/s with MFM. She verbalizes and agrees. Discussed induction due to elevated BMI @ 39 wks. She is in agreement. Will schedule at her next appointment. ROB & NST in 2 wk.   Doreene Burke, CNM

## 2021-05-22 NOTE — Patient Instructions (Signed)
Fetal Movement Counts Patient Name: ________________________________________________ Patient DueDate: ____________________ What is a fetal movement count?  A fetal movement count is the number of times that you feel your baby move during a certain amount of time. This may also be called a fetal kick count. A fetal movement count is recommended for every pregnant woman. You may be askedto start counting fetal movements as early as week 28 of your pregnancy. Pay attention to when your baby is most active. You may notice your baby's sleep and wake cycles. You may also notice things that make your baby move more. You should do a fetal movement count: When your baby is normally most active. At the same time each day. A good time to count movements is while you are resting, after having somethingto eat and drink. How do I count fetal movements? Find a quiet, comfortable area. Sit, or lie down on your side. Write down the date, the start time and stop time, and the number of movements that you felt between those two times. Take this information with you to your health care visits. Write down your start time when you feel the first movement. Count kicks, flutters, swishes, rolls, and jabs. You should feel at least 10 movements. You may stop counting after you have felt 10 movements, or if you have been counting for 2 hours. Write down the stop time. If you do not feel 10 movements in 2 hours, contact your health care provider for further instructions. Your health care provider may want to do additional tests to assess your baby's well-being. Contact a health care provider if: You feel fewer than 10 movements in 2 hours. Your baby is not moving like he or she usually does. Date: ____________ Start time: ____________ Stop time: ____________ Movements:____________ Date: ____________ Start time: ____________ Stop time: ____________ Movements:____________ Date: ____________ Start time: ____________ Stop  time: ____________ Movements:____________ Date: ____________ Start time: ____________ Stop time: ____________ Movements:____________ Date: ____________ Start time: ____________ Stop time: ____________ Movements:____________ Date: ____________ Start time: ____________ Stop time: ____________ Movements:____________ Date: ____________ Start time: ____________ Stop time: ____________ Movements:____________ Date: ____________ Start time: ____________ Stop time: ____________ Movements:____________ Date: ____________ Start time: ____________ Stop time: ____________ Movements:____________ This information is not intended to replace advice given to you by your health care provider. Make sure you discuss any questions you have with your healthcare provider. Document Revised: 05/20/2019 Document Reviewed: 05/20/2019 Elsevier Patient Education  2022 Elsevier Inc.  

## 2021-05-23 ENCOUNTER — Ambulatory Visit: Payer: BC Managed Care – PPO | Admitting: Podiatry

## 2021-05-23 ENCOUNTER — Encounter: Payer: Self-pay | Admitting: Podiatry

## 2021-05-23 DIAGNOSIS — L600 Ingrowing nail: Secondary | ICD-10-CM

## 2021-05-23 NOTE — Progress Notes (Signed)
  Subjective:  Patient ID: Hannah Stevens, female    DOB: 04/26/1991,  MRN: [id number]  Chief Complaint  Patient presents with   Ingrown Toenail    est pt ingrown toe nail R big toe    30 y.o. female presents with the above complaint. History confirmed with patient.   Objective:  Physical Exam: warm, good capillary refill, no trophic changes or ulcerative lesions, normal DP and PT pulses, and normal sensory exam. Left Foot:  Right Foot: Ingrown hallux nail  Assessment:   1. Ingrowing right great toenail      Plan:  Patient was evaluated and treated and all questions answered.    Ingrown Nail, right -Patient elects to proceed with minor surgery to remove ingrown toenail today. Consent reviewed and signed by patient. -Ingrown nail excised. See procedure note. -Educated on post-procedure care including soaking. Written instructions provided and reviewed. -If recurs would do permanent phenol matricectomy did not do this today due to her pregnancy status  Procedure: Excision of Ingrown Toenail Location: Right 1st toe lateral nail borders. Anesthesia: Lidocaine 1% plain; 1.5 mL and Marcaine 0.5% plain; 1.5 mL, digital block. Skin Prep: Betadine. Dressing: Silvadene; telfa; dry, sterile, compression dressing. Technique: Following skin prep, the toe was exsanguinated and a tourniquet was secured at the base of the toe. The affected nail border was freed, split with a nail splitter, and excised. The tourniquet was then removed and sterile dressing applied. Disposition: Patient tolerated procedure well.    Return if symptoms worsen or fail to improve.

## 2021-05-23 NOTE — Patient Instructions (Signed)

## 2021-06-04 ENCOUNTER — Other Ambulatory Visit: Payer: Self-pay | Admitting: Maternal & Fetal Medicine

## 2021-06-04 DIAGNOSIS — O99213 Obesity complicating pregnancy, third trimester: Secondary | ICD-10-CM

## 2021-06-05 ENCOUNTER — Other Ambulatory Visit: Payer: BC Managed Care – PPO

## 2021-06-05 ENCOUNTER — Encounter: Payer: BC Managed Care – PPO | Admitting: Certified Nurse Midwife

## 2021-06-05 ENCOUNTER — Ambulatory Visit (INDEPENDENT_AMBULATORY_CARE_PROVIDER_SITE_OTHER): Payer: BC Managed Care – PPO | Admitting: Certified Nurse Midwife

## 2021-06-05 ENCOUNTER — Other Ambulatory Visit: Payer: Self-pay

## 2021-06-05 VITALS — BP 137/84 | HR 114 | Wt 287.9 lb

## 2021-06-05 DIAGNOSIS — O99213 Obesity complicating pregnancy, third trimester: Secondary | ICD-10-CM

## 2021-06-05 DIAGNOSIS — Z3403 Encounter for supervision of normal first pregnancy, third trimester: Secondary | ICD-10-CM

## 2021-06-05 DIAGNOSIS — O99210 Obesity complicating pregnancy, unspecified trimester: Secondary | ICD-10-CM

## 2021-06-05 DIAGNOSIS — Z3A36 36 weeks gestation of pregnancy: Secondary | ICD-10-CM | POA: Diagnosis not present

## 2021-06-05 DIAGNOSIS — Z113 Encounter for screening for infections with a predominantly sexual mode of transmission: Secondary | ICD-10-CM

## 2021-06-05 LAB — POCT URINALYSIS DIPSTICK OB
Bilirubin, UA: NEGATIVE
Blood, UA: NEGATIVE
Glucose, UA: NEGATIVE
Ketones, UA: NEGATIVE
Leukocytes, UA: NEGATIVE
Nitrite, UA: NEGATIVE
POC,PROTEIN,UA: NEGATIVE
Spec Grav, UA: 1.01 (ref 1.010–1.025)
Urobilinogen, UA: 0.2 E.U./dL
pH, UA: 6.5 (ref 5.0–8.0)

## 2021-06-05 NOTE — Patient Instructions (Signed)

## 2021-06-05 NOTE — Progress Notes (Signed)
ROB and NST for elevated BMI.No arrhythmia audible on today's NST.  Discussed induction at 39 wks. PT is agreeable to plan . Will work on scheduling. She has growth u/s next with with MFM. Discussed completing SVE at 37 wks or greater she verbalize and agree. Herbal prep hand out given. Follow up 1 wk for NST and ROB with Pattricia Boss.   Body mass index is 51 kg/m.  NST  Baseline: 130's Moderate Variability Accelerations present Decelerations absent  Toco: no ctx noted.

## 2021-06-07 ENCOUNTER — Other Ambulatory Visit: Payer: Self-pay

## 2021-06-07 ENCOUNTER — Other Ambulatory Visit: Payer: Self-pay | Admitting: Maternal & Fetal Medicine

## 2021-06-07 ENCOUNTER — Ambulatory Visit: Payer: BC Managed Care – PPO | Attending: Maternal & Fetal Medicine

## 2021-06-07 DIAGNOSIS — E669 Obesity, unspecified: Secondary | ICD-10-CM | POA: Diagnosis not present

## 2021-06-07 DIAGNOSIS — Z3A37 37 weeks gestation of pregnancy: Secondary | ICD-10-CM | POA: Diagnosis not present

## 2021-06-07 DIAGNOSIS — O99213 Obesity complicating pregnancy, third trimester: Secondary | ICD-10-CM | POA: Diagnosis not present

## 2021-06-07 LAB — STREP GP B NAA: Strep Gp B NAA: NEGATIVE

## 2021-06-08 LAB — GC/CHLAMYDIA PROBE AMP
Chlamydia trachomatis, NAA: NEGATIVE
Neisseria Gonorrhoeae by PCR: NEGATIVE

## 2021-06-12 ENCOUNTER — Other Ambulatory Visit: Payer: Self-pay | Admitting: Maternal & Fetal Medicine

## 2021-06-12 ENCOUNTER — Other Ambulatory Visit: Payer: Self-pay

## 2021-06-12 ENCOUNTER — Other Ambulatory Visit: Payer: BC Managed Care – PPO

## 2021-06-12 ENCOUNTER — Ambulatory Visit (INDEPENDENT_AMBULATORY_CARE_PROVIDER_SITE_OTHER): Payer: BC Managed Care – PPO | Admitting: Certified Nurse Midwife

## 2021-06-12 ENCOUNTER — Ambulatory Visit: Payer: BC Managed Care – PPO | Attending: Obstetrics and Gynecology

## 2021-06-12 VITALS — BP 122/85 | HR 120 | Wt 287.9 lb

## 2021-06-12 DIAGNOSIS — Z3A27 27 weeks gestation of pregnancy: Secondary | ICD-10-CM | POA: Diagnosis not present

## 2021-06-12 DIAGNOSIS — Z3403 Encounter for supervision of normal first pregnancy, third trimester: Secondary | ICD-10-CM | POA: Diagnosis not present

## 2021-06-12 DIAGNOSIS — O99213 Obesity complicating pregnancy, third trimester: Secondary | ICD-10-CM

## 2021-06-12 DIAGNOSIS — Z3A37 37 weeks gestation of pregnancy: Secondary | ICD-10-CM | POA: Diagnosis not present

## 2021-06-12 DIAGNOSIS — O365930 Maternal care for other known or suspected poor fetal growth, third trimester, not applicable or unspecified: Secondary | ICD-10-CM

## 2021-06-12 DIAGNOSIS — E669 Obesity, unspecified: Secondary | ICD-10-CM | POA: Diagnosis not present

## 2021-06-12 DIAGNOSIS — O99210 Obesity complicating pregnancy, unspecified trimester: Secondary | ICD-10-CM

## 2021-06-12 LAB — POCT URINALYSIS DIPSTICK OB
Bilirubin, UA: NEGATIVE
Blood, UA: NEGATIVE
Glucose, UA: NEGATIVE
Ketones, UA: NEGATIVE
Nitrite, UA: NEGATIVE
Spec Grav, UA: 1.01 (ref 1.010–1.025)
Urobilinogen, UA: 0.2 E.U./dL
pH, UA: 8 (ref 5.0–8.0)

## 2021-06-12 NOTE — Patient Instructions (Signed)
Fetal Movement Counts Patient Name: ________________________________________________ Patient DueDate: ____________________ What is a fetal movement count?  A fetal movement count is the number of times that you feel your baby move during a certain amount of time. This may also be called a fetal kick count. A fetal movement count is recommended for every pregnant woman. You may be askedto start counting fetal movements as early as week 28 of your pregnancy. Pay attention to when your baby is most active. You may notice your baby's sleep and wake cycles. You may also notice things that make your baby move more. You should do a fetal movement count: When your baby is normally most active. At the same time each day. A good time to count movements is while you are resting, after having somethingto eat and drink. How do I count fetal movements? Find a quiet, comfortable area. Sit, or lie down on your side. Write down the date, the start time and stop time, and the number of movements that you felt between those two times. Take this information with you to your health care visits. Write down your start time when you feel the first movement. Count kicks, flutters, swishes, rolls, and jabs. You should feel at least 10 movements. You may stop counting after you have felt 10 movements, or if you have been counting for 2 hours. Write down the stop time. If you do not feel 10 movements in 2 hours, contact your health care provider for further instructions. Your health care provider may want to do additional tests to assess your baby's well-being. Contact a health care provider if: You feel fewer than 10 movements in 2 hours. Your baby is not moving like he or she usually does. Date: ____________ Start time: ____________ Stop time: ____________ Movements:____________ Date: ____________ Start time: ____________ Stop time: ____________ Movements:____________ Date: ____________ Start time: ____________ Stop  time: ____________ Movements:____________ Date: ____________ Start time: ____________ Stop time: ____________ Movements:____________ Date: ____________ Start time: ____________ Stop time: ____________ Movements:____________ Date: ____________ Start time: ____________ Stop time: ____________ Movements:____________ Date: ____________ Start time: ____________ Stop time: ____________ Movements:____________ Date: ____________ Start time: ____________ Stop time: ____________ Movements:____________ Date: ____________ Start time: ____________ Stop time: ____________ Movements:____________ This information is not intended to replace advice given to you by your health care provider. Make sure you discuss any questions you have with your healthcare provider. Document Revised: 05/20/2019 Document Reviewed: 05/20/2019 Elsevier Patient Education  2022 Elsevier Inc.  

## 2021-06-12 NOTE — Progress Notes (Signed)
ROB and NST for Obesity, IUGR 8th percentile. PT has MFM appointment today for doppler studies. She has induction scheduled 9/12 @ 0000. She will seem MDs next week due to scheduled vacation time. Please remind pt of covid testing prior to induction. SVE per pt request FT, high.   NST: reactive Baseline 135 Accelerations present Decelerations absent Ctx absent.   Follow up as scheduled.   Doreene Burke, CNM

## 2021-06-19 ENCOUNTER — Other Ambulatory Visit: Payer: Self-pay | Admitting: Obstetrics and Gynecology

## 2021-06-19 ENCOUNTER — Other Ambulatory Visit: Payer: BC Managed Care – PPO

## 2021-06-19 ENCOUNTER — Encounter: Payer: BC Managed Care – PPO | Admitting: Certified Nurse Midwife

## 2021-06-19 DIAGNOSIS — O365930 Maternal care for other known or suspected poor fetal growth, third trimester, not applicable or unspecified: Secondary | ICD-10-CM

## 2021-06-20 ENCOUNTER — Other Ambulatory Visit: Payer: BC Managed Care – PPO

## 2021-06-20 ENCOUNTER — Ambulatory Visit (INDEPENDENT_AMBULATORY_CARE_PROVIDER_SITE_OTHER): Payer: BC Managed Care – PPO | Admitting: Obstetrics and Gynecology

## 2021-06-20 ENCOUNTER — Other Ambulatory Visit: Payer: Self-pay

## 2021-06-20 ENCOUNTER — Encounter: Payer: Self-pay | Admitting: Obstetrics and Gynecology

## 2021-06-20 VITALS — BP 142/75 | HR 106 | Wt 292.4 lb

## 2021-06-20 DIAGNOSIS — Z3403 Encounter for supervision of normal first pregnancy, third trimester: Secondary | ICD-10-CM

## 2021-06-20 DIAGNOSIS — Z3A38 38 weeks gestation of pregnancy: Secondary | ICD-10-CM

## 2021-06-20 LAB — POCT URINALYSIS DIPSTICK OB
Bilirubin, UA: NEGATIVE
Blood, UA: NEGATIVE
Glucose, UA: NEGATIVE
Leukocytes, UA: NEGATIVE
Nitrite, UA: NEGATIVE
POC,PROTEIN,UA: NEGATIVE
Spec Grav, UA: 1.025 (ref 1.010–1.025)
Urobilinogen, UA: 0.2 E.U./dL
pH, UA: 6 (ref 5.0–8.0)

## 2021-06-20 NOTE — Progress Notes (Signed)
ROB: Having a few contractions per day.  Signs and symptoms of labor discussed.  NST today reactive.  Growth equals 20th percentile.  MFM Dopplers tomorrow.  Induction scheduled for Monday.  Induction discussed in detail, use of prostaglandins etc.  COVID testing on Friday discussed with patient.  Repeat blood pressure= 124/78.

## 2021-06-21 ENCOUNTER — Ambulatory Visit: Payer: BC Managed Care – PPO | Attending: Obstetrics and Gynecology

## 2021-06-21 ENCOUNTER — Other Ambulatory Visit: Payer: Self-pay

## 2021-06-21 DIAGNOSIS — O365930 Maternal care for other known or suspected poor fetal growth, third trimester, not applicable or unspecified: Secondary | ICD-10-CM | POA: Diagnosis not present

## 2021-06-21 DIAGNOSIS — E669 Obesity, unspecified: Secondary | ICD-10-CM

## 2021-06-21 DIAGNOSIS — Z3A39 39 weeks gestation of pregnancy: Secondary | ICD-10-CM | POA: Diagnosis not present

## 2021-06-21 DIAGNOSIS — O99213 Obesity complicating pregnancy, third trimester: Secondary | ICD-10-CM

## 2021-06-24 ENCOUNTER — Other Ambulatory Visit: Payer: Self-pay | Admitting: Certified Nurse Midwife

## 2021-06-25 ENCOUNTER — Encounter: Payer: Self-pay | Admitting: Certified Nurse Midwife

## 2021-06-25 ENCOUNTER — Inpatient Hospital Stay: Payer: BC Managed Care – PPO | Admitting: Certified Registered Nurse Anesthetist

## 2021-06-25 ENCOUNTER — Other Ambulatory Visit: Payer: Self-pay

## 2021-06-25 ENCOUNTER — Inpatient Hospital Stay
Admission: EM | Admit: 2021-06-25 | Discharge: 2021-06-27 | DRG: 807 | Disposition: A | Discharge status: Home / Self Care | Payer: BC Managed Care – PPO | Attending: Certified Nurse Midwife | Admitting: Certified Nurse Midwife

## 2021-06-25 DIAGNOSIS — Z23 Encounter for immunization: Secondary | ICD-10-CM | POA: Diagnosis not present

## 2021-06-25 DIAGNOSIS — Z3A39 39 weeks gestation of pregnancy: Secondary | ICD-10-CM

## 2021-06-25 DIAGNOSIS — Z3A38 38 weeks gestation of pregnancy: Secondary | ICD-10-CM | POA: Diagnosis not present

## 2021-06-25 DIAGNOSIS — Z20822 Contact with and (suspected) exposure to covid-19: Secondary | ICD-10-CM | POA: Diagnosis present

## 2021-06-25 DIAGNOSIS — O99214 Obesity complicating childbirth: Secondary | ICD-10-CM | POA: Diagnosis not present

## 2021-06-25 DIAGNOSIS — Z87891 Personal history of nicotine dependence: Secondary | ICD-10-CM

## 2021-06-25 DIAGNOSIS — R102 Pelvic and perineal pain: Secondary | ICD-10-CM | POA: Diagnosis not present

## 2021-06-25 DIAGNOSIS — Z8616 Personal history of COVID-19: Secondary | ICD-10-CM

## 2021-06-25 DIAGNOSIS — E669 Obesity, unspecified: Secondary | ICD-10-CM | POA: Diagnosis not present

## 2021-06-25 LAB — RPR: RPR Ser Ql: NONREACTIVE

## 2021-06-25 LAB — TYPE AND SCREEN
ABO/RH(D): O POS
Antibody Screen: NEGATIVE

## 2021-06-25 LAB — CBC
HCT: 35.1 % — ABNORMAL LOW (ref 36.0–46.0)
Hemoglobin: 12.4 g/dL (ref 12.0–15.0)
MCH: 29.6 pg (ref 26.0–34.0)
MCHC: 35.3 g/dL (ref 30.0–36.0)
MCV: 83.8 fL (ref 80.0–100.0)
Platelets: 264 10*3/uL (ref 150–400)
RBC: 4.19 MIL/uL (ref 3.87–5.11)
RDW: 13.2 % (ref 11.5–15.5)
WBC: 10.1 10*3/uL (ref 4.0–10.5)
nRBC: 0 % (ref 0.0–0.2)

## 2021-06-25 LAB — ABO/RH: ABO/RH(D): O POS

## 2021-06-25 LAB — RESP PANEL BY RT-PCR (FLU A&B, COVID) ARPGX2
Influenza A by PCR: NEGATIVE
Influenza B by PCR: NEGATIVE
SARS Coronavirus 2 by RT PCR: NEGATIVE

## 2021-06-25 MED ORDER — OXYTOCIN BOLUS FROM INFUSION
333.0000 mL | Freq: Once | INTRAVENOUS | Status: AC
  Administered 2021-06-26: 333 mL via INTRAVENOUS

## 2021-06-25 MED ORDER — LACTATED RINGERS IV SOLN
500.0000 mL | INTRAVENOUS | Status: DC | PRN
  Administered 2021-06-25: 500 mL via INTRAVENOUS

## 2021-06-25 MED ORDER — AMMONIA AROMATIC IN INHA
RESPIRATORY_TRACT | Status: AC
  Filled 2021-06-25: qty 10

## 2021-06-25 MED ORDER — ACETAMINOPHEN 325 MG PO TABS
650.0000 mg | ORAL_TABLET | ORAL | Status: DC | PRN
  Administered 2021-06-26: 650 mg via ORAL
  Filled 2021-06-25: qty 2

## 2021-06-25 MED ORDER — LIDOCAINE HCL (PF) 1 % IJ SOLN
30.0000 mL | INTRAMUSCULAR | Status: DC | PRN
  Filled 2021-06-25: qty 30

## 2021-06-25 MED ORDER — BUTORPHANOL TARTRATE 1 MG/ML IJ SOLN
1.0000 mg | INTRAMUSCULAR | Status: DC | PRN
  Administered 2021-06-25: 1 mg via INTRAVENOUS
  Filled 2021-06-25: qty 1

## 2021-06-25 MED ORDER — TERBUTALINE SULFATE 1 MG/ML IJ SOLN: 0.2500 mg | Freq: Once | INTRAMUSCULAR | Status: DC | PRN

## 2021-06-25 MED ORDER — OXYTOCIN-SODIUM CHLORIDE 30-0.9 UT/500ML-% IV SOLN
2.5000 [IU]/h | INTRAVENOUS | Status: DC
  Filled 2021-06-25: qty 500

## 2021-06-25 MED ORDER — LIDOCAINE HCL (PF) 1 % IJ SOLN
INTRAMUSCULAR | Status: DC | PRN
  Administered 2021-06-25: 3 mL

## 2021-06-25 MED ORDER — MISOPROSTOL 25 MCG QUARTER TABLET
50.0000 ug | ORAL_TABLET | ORAL | Status: DC
  Administered 2021-06-25 (×3): 50 ug via VAGINAL
  Filled 2021-06-25: qty 2
  Filled 2021-06-25 (×3): qty 1
  Filled 2021-06-25 (×3): qty 2
  Filled 2021-06-25: qty 1
  Filled 2021-06-25: qty 2

## 2021-06-25 MED ORDER — FENTANYL-BUPIVACAINE-NACL 0.5-0.125-0.9 MG/250ML-% EP SOLN
12.0000 mL/h | EPIDURAL | Status: DC | PRN
Start: 2021-06-25 — End: 2021-06-27
  Administered 2021-06-25: 12 mL/h via EPIDURAL
  Filled 2021-06-25: qty 250

## 2021-06-25 MED ORDER — LACTATED RINGERS IV SOLN: INTRAVENOUS | Status: DC

## 2021-06-25 MED ORDER — PHENYLEPHRINE 40 MCG/ML (10ML) SYRINGE FOR IV PUSH (FOR BLOOD PRESSURE SUPPORT)
80.0000 ug | PREFILLED_SYRINGE | INTRAVENOUS | Status: DC | PRN
  Filled 2021-06-25: qty 10

## 2021-06-25 MED ORDER — ONDANSETRON HCL 4 MG/2ML IJ SOLN: 4.0000 mg | Freq: Four times a day (QID) | INTRAMUSCULAR | Status: DC | PRN

## 2021-06-25 MED ORDER — FENTANYL-BUPIVACAINE-NACL 0.5-0.125-0.9 MG/250ML-% EP SOLN
EPIDURAL | Status: AC
  Filled 2021-06-25: qty 250

## 2021-06-25 MED ORDER — OXYTOCIN 10 UNIT/ML IJ SOLN
INTRAMUSCULAR | Status: AC
  Filled 2021-06-25: qty 2

## 2021-06-25 MED ORDER — LIDOCAINE HCL (PF) 1 % IJ SOLN
INTRAMUSCULAR | Status: AC
  Filled 2021-06-25: qty 30

## 2021-06-25 MED ORDER — LIDOCAINE-EPINEPHRINE (PF) 1.5 %-1:200000 IJ SOLN
INTRAMUSCULAR | Status: DC | PRN
  Administered 2021-06-25: 3 mL via PERINEURAL

## 2021-06-25 MED ORDER — LACTATED RINGERS IV SOLN: 500.0000 mL | Freq: Once | INTRAVENOUS | Status: DC

## 2021-06-25 MED ORDER — EPHEDRINE 5 MG/ML INJ
10.0000 mg | INTRAVENOUS | Status: DC | PRN
  Administered 2021-06-25: 10 mg via INTRAVENOUS
  Filled 2021-06-25: qty 4
  Filled 2021-06-25: qty 2

## 2021-06-25 MED ORDER — SOD CITRATE-CITRIC ACID 500-334 MG/5ML PO SOLN: 30.0000 mL | ORAL | Status: DC | PRN

## 2021-06-25 MED ORDER — EPHEDRINE 5 MG/ML INJ
10.0000 mg | INTRAVENOUS | Status: DC | PRN
  Administered 2021-06-25: 10 mg via INTRAVENOUS
  Filled 2021-06-25: qty 2

## 2021-06-25 MED ORDER — MISOPROSTOL 200 MCG PO TABS
ORAL_TABLET | ORAL | Status: AC
  Administered 2021-06-25: 50 ug via VAGINAL
  Filled 2021-06-25: qty 4

## 2021-06-25 MED ORDER — BUPIVACAINE HCL (PF) 0.25 % IJ SOLN
INTRAMUSCULAR | Status: DC | PRN
  Administered 2021-06-25 – 2021-06-26 (×4): 3 mL via EPIDURAL

## 2021-06-25 MED ORDER — DIPHENHYDRAMINE HCL 50 MG/ML IJ SOLN
12.5000 mg | INTRAMUSCULAR | Status: DC | PRN
Start: 2021-06-25 — End: 2021-06-27
  Filled 2021-06-25: qty 1

## 2021-06-25 MED ORDER — OXYTOCIN-SODIUM CHLORIDE 30-0.9 UT/500ML-% IV SOLN
1.0000 m[IU]/min | INTRAVENOUS | Status: DC
Start: 2021-06-25 — End: 2021-06-27
  Administered 2021-06-25: 4 m[IU]/min via INTRAVENOUS

## 2021-06-25 NOTE — Anesthesia Procedure Notes (Addendum)
Epidural Patient location during procedure: OB Start time: 06/25/2021 2:41 PM End time: 06/25/2021 3:05 PM  Staffing Resident/CRNA: Hezzie Bump, CRNA Performed: resident/CRNA   Preanesthetic Checklist Completed: patient identified, IV checked, site marked, risks and benefits discussed, surgical consent, monitors and equipment checked, pre-op evaluation and timeout performed  Epidural Patient position: sitting Prep: Betadine Patient monitoring: heart rate, continuous pulse ox and blood pressure Approach: midline Location: L3-L4 Injection technique: LOR saline  Needle:  Needle type: Tuohy  Needle gauge: 17 G Needle length: 9 cm and 9 Catheter type: closed end flexible Catheter size: 20 Guage Catheter at skin depth: 14 cm Test dose: negative and 1.5% lidocaine with Epi 1:200 K  Assessment Sensory level: T10 Events: blood not aspirated, injection not painful, no injection resistance, no paresthesia and negative IV test  Additional Notes   Patient tolerated the insertion well without complications.Reason for block:procedure for pain

## 2021-06-25 NOTE — Progress Notes (Signed)
LABOR NOTE   Hannah Stevens 30 y.o.GP@ at [redacted]w[redacted]d  SUBJECTIVE:  Back pain Analgesia: Labor support without medications  OBJECTIVE:  BP 116/66   Pulse 79   Temp 98.2 F (36.8 C)   Resp 16   Ht 5\' 3"  (1.6 m)   Wt 132.6 kg   LMP 09/22/2020   BMI 51.80 kg/m  No intake/output data recorded.  She has not shown cervical change. CERVIX: 1 cm:  60%:   -2:   posterior:   firm SVE:   Dilation: 1 Effacement (%): 60 Station: Ballotable Exam by:: [email or web address] RN CONTRACTIONS: irregular, every 1-2 minutes FHR: Fetal heart tracing reviewed. Baseline: 130 bpm, Variability: Good {> 6 bpm), Accelerations: Reactive, and Decelerations: Absent Category I    Labs: Lab Results  Component Value Date   WBC 10.1 06/25/2021   HGB 12.4 06/25/2021   HCT 35.1 (L) 06/25/2021   MCV 83.8 06/25/2021   PLT 264 06/25/2021    ASSESSMENT: 1) Labor curve reviewed.       Progress: Early latent labor.     Membranes: ruptured, clear fluid , with placement of foley bulb        Active Problems:   Labor and delivery, indication for care   PLAN: continue present management and vaginal/cervical Prostin Foley bulb placed  08/25/2021, CNM 06/25/2021 1:38 PM

## 2021-06-25 NOTE — H&P (Signed)
History and Physical   HPI  Hannah Stevens is a 30 y.o. G1P0000 at [redacted]w[redacted]d Estimated Date of Delivery: 06/28/21 who is being admitted for induction of labor due to elevated BMI in pregnancy.   OB History  OB History  Gravida Para Term Preterm AB Living  1 0 0 0 0 0  SAB IAB Ectopic Multiple Live Births  0 0 0 0 0    # Outcome Date GA Lbr Len/2nd Weight Sex Delivery Anes PTL Lv  1 Current             Obstetric Comments  1st Menstrual Cycle: 9   1st Pregnancy: N/A    PROBLEM LIST  Pregnancy complications or risks: Patient Active Problem List   Diagnosis Date Noted   Labor and delivery, indication for care 06/25/2021   Obesity in pregnancy 12/15/2020   Genetic screening 12/15/2020   Type O blood, Rh positive 11/24/2020   History of COVID-19 10/16/2020   Acute upper respiratory infection 09/13/2020   Cough 09/13/2020   Dysuria 01/17/2020   External hemorrhoid 07/05/2019   Endometriosis 11/16/2018   Pelvic and perineal pain 11/16/2018   Severe right groin pain 11/15/2018   Generalized abdominal pain 10/19/2018   Other constipation 10/19/2018   Abnormal weight gain 03/12/2018   Moderate obesity 03/12/2018   Other fatigue 03/12/2018   Impaired fasting glucose 03/12/2018   Vitamin D deficiency 03/12/2018   Anxiety     Prenatal labs and studies: ABO, Rh: --/--/O POS Performed at [facility], [address] [place], [place] [address]  [phone]/12 0216) Antibody: NEG (09/12 0057) Rubella: 1.67 (02/10 1016) RPR: Non Reactive (06/28 1003)  HBsAg: Negative (02/10 1016)  HIV: Non Reactive (02/10 1016)  BPZ:WCHENIDP/-- (08/23 1627)   Past Medical History:  Diagnosis Date   Anxiety    Ovarian cyst    Pelvic pain    Vaccine for human papilloma virus (HPV) types 6, 11, 16, and 18 administered      Past Surgical History:  Procedure Laterality Date   TONSILLECTOMY     WISDOM TOOTH EXTRACTION       Medications    Current Discharge Medication List      CONTINUE these medications which have NOT CHANGED   Details  aspirin EC 81 MG tablet Take 1 tablet (81 mg total) by mouth daily. Take after 12 weeks for prevention of preeclampssia later in pregnancy Qty: 300 tablet, Refills: 2    FIBER ADULT GUMMIES PO Take by mouth.    Prenatal Multivit-Min-Fe-FA (PRE-NATAL PO) Take by mouth daily.          Allergies  Patient has no known allergies.  Review of Systems  Constitutional: negative Eyes: negative Ears, nose, mouth, throat, and face: negative Respiratory: negative Cardiovascular: negative Gastrointestinal: negative Genitourinary:negative Integument/breast: negative Hematologic/lymphatic: negative Musculoskeletal:negative Neurological: negative Behavioral/Psych: negative Endocrine: negative Allergic/Immunologic: negative  Physical Exam  BP 127/75 (BP Location: Left Arm)   Pulse 100   Temp 98.2 F (36.8 C) (Oral)   Resp 16   Ht 5\' 3"  (1.6 m)   Wt 132.6 kg   LMP 09/22/2020   BMI 51.80 kg/m   Lungs:  CTA B Cardio: RRR without M/R/G Abd: Soft, gravid, NT Presentation: cephalic EXT: No C/C/ 1+ Edema DTRs: 2+ B CERVIX: Dilation: Fingertip Effacement (%): Thick Cervical Position: Posterior Station: Ballotable Exam by:: A [email or web address] RN  See Prenatal records for more detailed PE.     FHR:  Baseline: 130 bpm, Variability:  Good {> 6 bpm), Accelerations: Reactive, and Decelerations: Absent  Toco: Uterine Contractions: Frequency: Every 1-5 minutes and Intensity: mild  Test Results  Results for orders placed or performed during the hospital encounter of 06/25/21 (from the past 24 hour(s))  Resp Panel by RT-PCR (Flu A&B, Covid) Nasopharyngeal Swab     Status: None   Collection Time: 06/25/21 12:32 AM   Specimen: Nasopharyngeal Swab; Nasopharyngeal(NP) swabs in vial transport medium  Result Value Ref Range   SARS Coronavirus 2 by RT PCR NEGATIVE NEGATIVE   Influenza A by PCR NEGATIVE NEGATIVE   Influenza B  by PCR NEGATIVE NEGATIVE  Type and screen     Status: None   Collection Time: 06/25/21 12:57 AM  Result Value Ref Range   ABO/RH(D) O POS    Antibody Screen NEG    Sample Expiration      06/28/2021,2359 Performed at [facility], [address] [place], [place] [address]   CBC     Status: Abnormal   Collection Time: 06/25/21 12:57 AM  Result Value Ref Range   WBC 10.1 4.0 - 10.5 K/uL   RBC 4.19 3.87 - 5.11 MIL/uL   Hemoglobin 12.4 12.0 - 15.0 g/dL   HCT 51.7 (L) 00.1 - 74.9 %   MCV 83.8 80.0 - 100.0 fL   MCH 29.6 26.0 - 34.0 pg   MCHC 35.3 30.0 - 36.0 g/dL   RDW 44.9 67.5 - 91.6 %   Platelets 264 150 - 400 K/uL   nRBC 0.0 0.0 - 0.2 %  ABO/Rh     Status: None   Collection Time: 06/25/21  2:16 AM  Result Value Ref Range   ABO/RH(D)      O POS Performed at [facility], [address] [place], [place] [address]    Group B Strep negative  Assessment   G1P0000 at [redacted]w[redacted]d Estimated Date of Delivery: 06/28/21  The fetus is reassuring.   Patient Active Problem List   Diagnosis Date Noted   Labor and delivery, indication for care 06/25/2021   Obesity in pregnancy 12/15/2020   Genetic screening 12/15/2020   Type O blood, Rh positive 11/24/2020   History of COVID-19 10/16/2020   Acute upper respiratory infection 09/13/2020   Cough 09/13/2020   Dysuria 01/17/2020   External hemorrhoid 07/05/2019   Endometriosis 11/16/2018   Pelvic and perineal pain 11/16/2018   Severe right groin pain 11/15/2018   Generalized abdominal pain 10/19/2018   Other constipation 10/19/2018   Abnormal weight gain 03/12/2018   Moderate obesity 03/12/2018   Other fatigue 03/12/2018   Impaired fasting glucose 03/12/2018   Vitamin D deficiency 03/12/2018   Anxiety     Plan  1. Admitted to L&D :   2. EFM:-- Category 1 3. Stadol or Epidural if desired.   4. Admission labs completed 5. Dr.Evans aware of admission and plan 6.Anticipate NSVD  Doreene Burke, CNM   06/25/2021 7:20 AM

## 2021-06-25 NOTE — Progress Notes (Signed)
LABOR NOTE   Hannah Stevens 30 y.o.GP@ at [redacted]w[redacted]d  SUBJECTIVE:  Feeling some vaginal cramping. Analgesia: Labor support without medications  OBJECTIVE:  BP 116/66   Pulse 79   Temp 98.2 F (36.8 C)   Resp 16   Ht 5\' 3"  (1.6 m)   Wt 132.6 kg   LMP 09/22/2020   BMI 51.80 kg/m  No intake/output data recorded.  She has not shown cervical change. CERVIX: 1 cm:  60:   -2:   posterior:   firm SVE:   Dilation: 1 Effacement (%): 60 Station: Ballotable Exam by:: [email or web address] RN CONTRACTIONS: irregular mild FHR: Fetal heart tracing reviewed. Baseline: 120 bpm, Variability: Good {> 6 bpm), Accelerations: Reactive, and Decelerations: Absent Category I    Labs: Lab Results  Component Value Date   WBC 10.1 06/25/2021   HGB 12.4 06/25/2021   HCT 35.1 (L) 06/25/2021   MCV 83.8 06/25/2021   PLT 264 06/25/2021    ASSESSMENT: 1) Labor curve reviewed.       Progress: Not in labor.     Membranes: intact            Active Problems:   Labor and delivery, indication for care Elevated BMI  PLAN: continue present management and plan for foley bulb placement.    08/25/2021, CNM  06/25/2021 11:55 AM

## 2021-06-25 NOTE — Progress Notes (Signed)
LABOR NOTE   Hannah Stevens 30 y.o.GP@ at [redacted]w[redacted]d  SUBJECTIVE:  Comfortable with epidural  Analgesia: Epidural  OBJECTIVE:  BP 108/64   Pulse 91   Temp 97.6 F (36.4 C) (Oral)   Resp 16   Ht 5\' 3"  (1.6 m)   Wt 132.6 kg   LMP 09/22/2020   SpO2 92%   BMI 51.80 kg/m  No intake/output data recorded.  She has shown cervical change. CERVIX: 5 cm:  60%:   -2:   mid position:   firm SVE:   Dilation: 4 Effacement (%): 60 Station: -2 Exam by:: [email or web address] RN CONTRACTIONS: regular, every 2 minutes (IUPC placed) FHR: Fetal heart tracing reviewed. Baseline: 130 bpm, Variability: Good {> 6 bpm), Accelerations: Reactive, and Decelerations: x 1 while lying on back for IUPC placement  Category II    Labs: Lab Results  Component Value Date   WBC 10.1 06/25/2021   HGB 12.4 06/25/2021   HCT 35.1 (L) 06/25/2021   MCV 83.8 06/25/2021   PLT 264 06/25/2021    ASSESSMENT: 1) Labor curve reviewed.       Progress: Prolonged latent labor.     Membranes: ruptured, clear fluid     IUPC placed       Active Problems:   Labor and delivery, indication for care Elevated BMI in pregnancy  PLAN: continue present management and IV Pitocin induction Titrate pitocin as need for MVU's 200-250.   08/25/2021, CNM  06/25/2021 8:28 PM

## 2021-06-25 NOTE — Anesthesia Preprocedure Evaluation (Addendum)
Anesthesia Evaluation    Reviewed: Allergy & Precautions, H&P , Patient's Chart, lab work & pertinent test results  History of Anesthesia Complications Negative for: history of anesthetic complications  Airway Mallampati: III  TM Distance: >3 FB Neck ROM: full    Dental no notable dental hx.    Pulmonary neg pulmonary ROS, former smoker,    Pulmonary exam normal        Cardiovascular negative cardio ROS Normal cardiovascular exam Rhythm:regular Rate:Normal     Neuro/Psych Anxiety negative neurological ROS     GI/Hepatic negative GI ROS, Neg liver ROS,   Endo/Other  Morbid obesity  Renal/GU negative Renal ROS  negative genitourinary   Musculoskeletal   Abdominal   Peds  Hematology negative hematology ROS (+)   Anesthesia Other Findings   Reproductive/Obstetrics                            Anesthesia Physical Anesthesia Plan  ASA: 3  Anesthesia Plan: Epidural   Post-op Pain Management:    Induction:   PONV Risk Score and Plan:   Airway Management Planned:   Additional Equipment:   Intra-op Plan:   Post-operative Plan:   Informed Consent: I have reviewed the patients History and Physical, chart, labs and discussed the procedure including the risks, benefits and alternatives for the proposed anesthesia with the patient or authorized representative who has indicated his/her understanding and acceptance.       Plan Discussed with: Anesthesiologist and CRNA  Anesthesia Plan Comments:         Anesthesia Quick Evaluation

## 2021-06-25 NOTE — Progress Notes (Signed)
LABOR NOTE   Hannah Stevens 30 y.o.GP@ at [redacted]w[redacted]d  SUBJECTIVE:  Comfortable with epidural , resting with family at the bedside.  Analgesia: Epidural  OBJECTIVE:  BP (!) 115/59   Pulse 97   Temp 98.3 F (36.8 C) (Oral)   Resp 16   Ht 5\' 3"  (1.6 m)   Wt 132.6 kg   LMP 09/22/2020   SpO2 96%   BMI 51.80 kg/m  No intake/output data recorded.  She has shown cervical change. CERVIX: 4 cm per RN exam : SVE:   Dilation: 1 Effacement (%): 60 Station: -2 Exam by:: Ogden Handlin Thomopson CNM CONTRACTIONS: regular, every 2-4 minutes FHR: Fetal heart tracing reviewed. Baseline: 130 bpm, Variability: Good {> 6 bpm), Accelerations: Reactive, and Decelerations: Absent Category I    Labs: Lab Results  Component Value Date   WBC 10.1 06/25/2021   HGB 12.4 06/25/2021   HCT 35.1 (L) 06/25/2021   MCV 83.8 06/25/2021   PLT 264 06/25/2021    ASSESSMENT: 1) Labor curve reviewed.       Progress: Early latent labor.     Membranes: ruptured, clear fluid     Foley bulb came out approximately 4 pm , per RN.        Active Problems:   Labor and delivery, indication for care   PLAN: IV Pitocin ordered.    08/25/2021, CNM  06/25/2021 5:10 PM

## 2021-06-26 ENCOUNTER — Encounter: Payer: Self-pay | Admitting: Certified Nurse Midwife

## 2021-06-26 ENCOUNTER — Encounter: Payer: BC Managed Care – PPO | Admitting: Certified Nurse Midwife

## 2021-06-26 ENCOUNTER — Other Ambulatory Visit: Payer: BC Managed Care – PPO

## 2021-06-26 MED ORDER — ONDANSETRON HCL 4 MG PO TABS: 4.0000 mg | ORAL_TABLET | ORAL | Status: DC | PRN

## 2021-06-26 MED ORDER — METHYLERGONOVINE MALEATE 0.2 MG/ML IJ SOLN: 0.2000 mg | INTRAMUSCULAR | Status: DC | PRN

## 2021-06-26 MED ORDER — FERROUS SULFATE 325 (65 FE) MG PO TABS
325.0000 mg | ORAL_TABLET | Freq: Every day | ORAL | Status: DC
  Administered 2021-06-27: 325 mg via ORAL
  Filled 2021-06-26: qty 1

## 2021-06-26 MED ORDER — PRENATAL MULTIVITAMIN CH
1.0000 | ORAL_TABLET | Freq: Every day | ORAL | Status: DC
  Administered 2021-06-27: 1 via ORAL
  Filled 2021-06-26: qty 1

## 2021-06-26 MED ORDER — ONDANSETRON HCL 4 MG/2ML IJ SOLN: 4.0000 mg | INTRAMUSCULAR | Status: DC | PRN

## 2021-06-26 MED ORDER — SIMETHICONE 80 MG PO CHEW: 80.0000 mg | CHEWABLE_TABLET | ORAL | Status: DC | PRN

## 2021-06-26 MED ORDER — MISOPROSTOL 200 MCG PO TABS
800.0000 ug | ORAL_TABLET | Freq: Once | ORAL | Status: AC
  Administered 2021-06-26: 800 ug via RECTAL

## 2021-06-26 MED ORDER — TRANEXAMIC ACID-NACL 1000-0.7 MG/100ML-% IV SOLN
INTRAVENOUS | Status: AC
  Administered 2021-06-26: 1000 mg via INTRAVENOUS
  Filled 2021-06-26: qty 100

## 2021-06-26 MED ORDER — DIBUCAINE (PERIANAL) 1 % EX OINT
1.0000 "application " | TOPICAL_OINTMENT | CUTANEOUS | Status: DC | PRN
  Filled 2021-06-26: qty 28

## 2021-06-26 MED ORDER — DIBUCAINE (PERIANAL) 1 % EX OINT
TOPICAL_OINTMENT | CUTANEOUS | Status: AC
  Filled 2021-06-26: qty 28

## 2021-06-26 MED ORDER — OXYCODONE-ACETAMINOPHEN 5-325 MG PO TABS: 1.0000 | ORAL_TABLET | ORAL | Status: DC | PRN

## 2021-06-26 MED ORDER — DOCUSATE SODIUM 100 MG PO CAPS
100.0000 mg | ORAL_CAPSULE | Freq: Two times a day (BID) | ORAL | Status: DC
  Administered 2021-06-27: 100 mg via ORAL
  Filled 2021-06-26: qty 1

## 2021-06-26 MED ORDER — METHYLERGONOVINE MALEATE 0.2 MG PO TABS: 0.2000 mg | ORAL_TABLET | ORAL | Status: DC | PRN

## 2021-06-26 MED ORDER — METHYLERGONOVINE MALEATE 0.2 MG/ML IJ SOLN
INTRAMUSCULAR | Status: AC
  Filled 2021-06-26: qty 1

## 2021-06-26 MED ORDER — IBUPROFEN 600 MG PO TABS
600.0000 mg | ORAL_TABLET | Freq: Four times a day (QID) | ORAL | Status: DC
  Administered 2021-06-26 – 2021-06-27 (×4): 600 mg via ORAL
  Filled 2021-06-26 (×4): qty 1

## 2021-06-26 MED ORDER — COCONUT OIL OIL
1.0000 "application " | TOPICAL_OIL | Status: DC | PRN
  Filled 2021-06-26: qty 120

## 2021-06-26 MED ORDER — SENNOSIDES-DOCUSATE SODIUM 8.6-50 MG PO TABS
2.0000 | ORAL_TABLET | ORAL | Status: DC
  Administered 2021-06-26: 2 via ORAL
  Filled 2021-06-26: qty 2

## 2021-06-26 MED ORDER — BENZOCAINE-MENTHOL 20-0.5 % EX AERO
INHALATION_SPRAY | CUTANEOUS | Status: AC
  Filled 2021-06-26: qty 56

## 2021-06-26 MED ORDER — DIPHENHYDRAMINE HCL 50 MG/ML IJ SOLN
50.0000 mg | Freq: Once | INTRAMUSCULAR | Status: AC
  Administered 2021-06-26: 50 mg via INTRAVENOUS

## 2021-06-26 MED ORDER — TRANEXAMIC ACID-NACL 1000-0.7 MG/100ML-% IV SOLN: 1000.0000 mg | INTRAVENOUS | Status: AC

## 2021-06-26 MED ORDER — BENZOCAINE-MENTHOL 20-0.5 % EX AERO: 1.0000 "application " | INHALATION_SPRAY | CUTANEOUS | Status: DC | PRN

## 2021-06-26 MED ORDER — WITCH HAZEL-GLYCERIN EX PADS
MEDICATED_PAD | CUTANEOUS | Status: AC
  Filled 2021-06-26: qty 100

## 2021-06-26 MED ORDER — WITCH HAZEL-GLYCERIN EX PADS: 1.0000 "application " | MEDICATED_PAD | CUTANEOUS | Status: DC | PRN

## 2021-06-26 MED ORDER — CARBOPROST TROMETHAMINE 250 MCG/ML IM SOLN
INTRAMUSCULAR | Status: AC
  Filled 2021-06-26: qty 1

## 2021-06-26 MED ORDER — OXYCODONE-ACETAMINOPHEN 5-325 MG PO TABS: 2.0000 | ORAL_TABLET | ORAL | Status: DC | PRN

## 2021-06-26 NOTE — Lactation Note (Signed)
This note was copied from a baby's chart. Lactation Consultation Note  Patient Name: Hannah Stevens [id number] Date: 06/26/2021 Reason for consult: L&D Initial assessment;Primapara;1st time breastfeeding;Term Age:30 hours  Initial lactation visit in L&D. Mom is P1, Vag-Vac delivery <1 hour ago. Mom desires breastfeeding. Transition RN has assisted with position/latch, requested further assistance and/or observation of feeding.  Baby skin to skin with mom on chest, bobbing around. LC placed support pillow for latch/feeding on R breast in football hold. Mom allowed [facility] to take baby and place in position and alignment, sandwiching and teacup hold used for good breast support latch of baby. Baby was on/off the breast. Mom's nipple do appear flat, but can evert with some stimulation and surrounding breast tissue is compressible. Colostrum easily expressed and demonstrated while teaching hand expression to encourage a deep latch. Baby on/off breast for 12 minutes with LC support at bedside.  LC reviewed newborn stomach size, feeding patterns and behaviors in first 24 hours. Encouraged 8-12 feeding attempts, and hand expression if baby is sleepy. Discussed output expectations of 1 wet and 1 poop diaper during first 24 hours, benefits of skin to skin, and rest when possible.  Maternal Data Has patient been taught Hand Expression?: Yes Does the patient have breastfeeding experience prior to this delivery?: No  Feeding Mother's Current Feeding Choice: Breast Milk  LATCH Score Latch: Repeated attempts needed to sustain latch, nipple held in mouth throughout feeding, stimulation needed to elicit sucking reflex.  Audible Swallowing: A few with stimulation  Type of Nipple: Flat (does evert some with stimulation)  Comfort (Breast/Nipple): Soft / non-tender  Hold (Positioning): Assistance needed to correctly position infant at breast and maintain latch.  LATCH Score: 6   Lactation Tools  Discussed/Used    Interventions Interventions: Breast feeding basics reviewed;Assisted with latch;Hand express;Breast compression;Support pillows;Position options;Education  Discharge    Consult Status Consult Status: Follow-up Date: 06/27/21 Follow-up type: In-patient    Danford Bad 06/26/2021, 5:34 PM

## 2021-06-26 NOTE — Progress Notes (Signed)
LABOR NOTE   Hannah Stevens 30 y.o.GP@ at [redacted]w[redacted]d  SUBJECTIVE:  Feeling vaginal discomfort Analgesia: Epidural  OBJECTIVE:  BP 129/69   Pulse 86   Temp 98.8 F (37.1 C) (Oral)   Resp 16   Ht 5\' 3"  (1.6 m)   Wt 132.6 kg   LMP 09/22/2020   SpO2 98%   BMI 51.80 kg/m  No intake/output data recorded.  She has shown cervical change. CERVIX: 7 cm:  75%:   -2:   mid position:   soft SVE:   Dilation: 5 Effacement (%): 60 Station: -2 Exam by:: A. [email or web address], CNM CONTRACTIONS: regular, every 2-3 minutes FHR: Fetal heart tracing reviewed. Baseline: 130 bpm, Variability: Good {> 6 bpm), Accelerations: Reactive, and Decelerations: Absent Category I    Labs: Lab Results  Component Value Date   WBC 10.1 06/25/2021   HGB 12.4 06/25/2021   HCT 35.1 (L) 06/25/2021   MCV 83.8 06/25/2021   PLT 264 06/25/2021    ASSESSMENT: 1) Labor curve reviewed.       Progress: Active phase labor.     Membranes: ruptured, clear fluid     IUPC placed       Active Problems:   Labor and delivery, indication for care Elevated BMI in pregnancy   PLAN: increase Pitocin rate   08/25/2021, CNM  06/26/2021 5:59 AM

## 2021-06-26 NOTE — Progress Notes (Signed)
LABOR NOTE   Hannah Stevens 30 y.o.GP@ at [redacted]w[redacted]d  SUBJECTIVE:  Pushing x 2.5 hrs, pt uncomfortable with pressure and rectal pain  Analgesia: Epidural  OBJECTIVE:  BP 135/79   Pulse (!) 104   Temp 100.2 F (37.9 C) (Axillary)   Resp 16   Ht 5\' 3"  (1.6 m)   Wt 132.6 kg   LMP 09/22/2020   SpO2 97%   BMI 51.80 kg/m  Total I/O In: -  Out: 725 [Urine:725]  She has shown cervical change. CERVIX: 10:  100%:    plus 2 :   not felt:    SVE:   Dilation: 10 Effacement (%): 70 Station: -1 Exam by:: Hannah Stevens cnm CONTRACTIONS: regular, every 2-4 minutes FHR: Fetal heart tracing reviewed. Baseline: 160 bpm, Variability: Good {> 6 bpm), Accelerations: Non-reactive but appropriate for gestational age, and Decelerations: variable and early with pushing  Category II    Labs: Lab Results  Component Value Date   WBC 10.1 06/25/2021   HGB 12.4 06/25/2021   HCT 35.1 (L) 06/25/2021   MCV 83.8 06/25/2021   PLT 264 06/25/2021    ASSESSMENT: 1) Labor curve reviewed.       Progress: Active phase labor.     Membranes: ruptured, clear fluid     IUPC in place       Active Problems:   Labor and delivery, indication for care Elevated BMI  PLAN: IV Pitocin induction, IV fluid bolus, and tylenol given, position changes with pushing. Dr. 08/25/2021 notified to discuss assistance with vacuum. Fetal head plus 2 station with pushing for 2.5 hrs.   Logan Bores, CNM  06/26/2021 2:57 PM

## 2021-06-27 LAB — CBC
HCT: 31.5 % — ABNORMAL LOW (ref 36.0–46.0)
Hemoglobin: 11.4 g/dL — ABNORMAL LOW (ref 12.0–15.0)
MCH: 31 pg (ref 26.0–34.0)
MCHC: 36.2 g/dL — ABNORMAL HIGH (ref 30.0–36.0)
MCV: 85.6 fL (ref 80.0–100.0)
Platelets: 241 10*3/uL (ref 150–400)
RBC: 3.68 MIL/uL — ABNORMAL LOW (ref 3.87–5.11)
RDW: 13.8 % (ref 11.5–15.5)
WBC: 17.6 10*3/uL — ABNORMAL HIGH (ref 4.0–10.5)
nRBC: 0 % (ref 0.0–0.2)

## 2021-06-27 MED ORDER — IBUPROFEN 600 MG PO TABS: 600.0000 mg | ORAL_TABLET | Freq: Four times a day (QID) | ORAL | 0 refills | Status: DC

## 2021-06-27 MED ORDER — FERROUS SULFATE 325 (65 FE) MG PO TABS: 325.0000 mg | ORAL_TABLET | Freq: Every day | ORAL | 3 refills | Status: DC

## 2021-06-27 NOTE — Discharge Instructions (Signed)

## 2021-06-27 NOTE — Final Progress Note (Signed)
Discharge Day SOAP Note:  Progress Note - Vaginal Delivery  Hannah Stevens is a 30 y.o. G1P1001 now PP day 1 s/p Vaginal, Vacuum (Extractor) . Delivery was complicated by obesity  Subjective  The patient has the following complaints: has no unusual complaints  Pain is controlled with current medications.   Patient is urinating without difficulty.  She is ambulating well.     Objective  Vital signs: BP 92/61 (BP Location: Right Arm)   Pulse 78   Temp 98 F (36.7 C) (Oral)   Resp 16   Ht 5\' 3"  (1.6 m)   Wt 132.6 kg   LMP 09/22/2020   SpO2 98%   Breastfeeding Unknown   BMI 51.80 kg/m   Physical Exam: Gen: NAD Fundus Fundal Tone: Firm  Lochia Amount: Scant        Data Review Labs: Lab Results  Component Value Date   WBC 10.1 06/25/2021   HGB 12.4 06/25/2021   HCT 35.1 (L) 06/25/2021   MCV 83.8 06/25/2021   PLT 264 06/25/2021   CBC Latest Ref Rng & Units 06/25/2021 04/30/2021 04/10/2021  WBC 4.0 - 10.5 K/uL 10.1 9.8 10.7  Hemoglobin 12.0 - 15.0 g/dL 04/12/2021 54.0 98.1  Hematocrit 36.0 - 46.0 % 35.1(L) 35.7 38.1  Platelets 150 - 400 K/uL 264 312 310   O POS Performed at [facility], [address] [place], [place]   [address] Score: No flowsheet data found.  Assessment/Plan  Active Problems:   Labor and delivery, indication for care    Plan for discharge today.  Discharge Instructions: Per After Visit Summary. Activity: Advance as tolerated. Pelvic rest for 6 weeks.  Also refer to After Visit Summary Diet: Regular Medications: Allergies as of 06/27/2021   No Known Allergies      Medication List     STOP taking these medications    aspirin EC 81 MG tablet       TAKE these medications    ferrous sulfate 325 (65 FE) MG tablet Take 1 tablet (325 mg total) by mouth daily with breakfast.   FIBER ADULT GUMMIES PO Take by mouth.   ibuprofen 600 MG tablet Commonly known as: ADVIL Take 1 tablet (600 mg total) by mouth every 6  (six) hours.   PRE-NATAL PO Take by mouth daily.       Outpatient follow up:  Postpartum contraception: Will discuss at first office visit post-partum  Discharged Condition: good  Discharged to: home  Newborn Data: Disposition:home with mother  Apgars: APGAR (1 MIN): 7   APGAR (5 MINS): 9   APGAR (10 MINS):    Baby Feeding: Breast    06/29/2021, CNM  06/27/2021 7:39 AM

## 2021-06-27 NOTE — Lactation Note (Signed)
This note was copied from a baby's chart. Lactation Consultation Note  Patient Name: Hannah Stevens [id number] Date: 06/27/2021 Reason for consult: Follow-up assessment;Primapara;Term Age:30 hours  Mom has baby at the breast on L side. This side has more of a flat nipple than the right, and does not evert as much when stimulated. Baby has been attempting to feed for 10 minutes but unable to sustain latch, mom hand expressing into mouth.  LC brought in size 20 and 57mm nipple shields. Size 21mm given first based on compromise of mom's nipple and baby's mouth. Baby accepted the shield without resistance, had immediate sucking pattern and began to fall into a rhythm. Mom had no discomfort, could feel tugging sensation. Mom providing breast massage and compression throughout feeding, audible swallows identified.  Encouraged to use NS on L nipple when feeding, and continue to aim for feedings on the right without it, but told it's ok to use for both. R does evert but not far. Reviewed cleaning and reusing of the nipple shield.   Maternal Data Has patient been taught Hand Expression?: Yes Does the patient have breastfeeding experience prior to this delivery?: No  Feeding Mother's Current Feeding Choice: Breast Milk  LATCH Score Latch: Grasps breast easily, tongue down, lips flanged, rhythmical sucking.  Audible Swallowing: A few with stimulation  Type of Nipple: Flat (L nipple)  Comfort (Breast/Nipple): Soft / non-tender  Hold (Positioning): No assistance needed to correctly position infant at breast.  LATCH Score: 8   Lactation Tools Discussed/Used Tools: Nipple Shields Nipple shield size: 20 (per mom's comfort)  Interventions Interventions: Breast feeding basics reviewed;Education (nipple shield)  Discharge Pump: Manual;Personal  Consult Status Consult Status: Follow-up Date: 06/27/21 Follow-up type: Call as needed    Danford Bad 06/27/2021, 2:17 PM

## 2021-06-27 NOTE — Discharge Summary (Signed)
Patient Name: Hannah Stevens DOB: 1990-10-26 MRN: [id number]                            Discharge Summary  Date of Admission: 06/25/2021 Date of Discharge: 06/27/2021 Delivering Provider: Doreene Burke   Admitting Diagnosis: Labor and delivery, indication for care [O75.9] at [redacted]w[redacted]d Secondary diagnosis:  Active Problems:   Labor and delivery, indication for care   Mode of Delivery: vacuum-assisted vaginal delivery              Discharge diagnosis: Term Pregnancy Delivered and Elevated BMI in pregnancy       Intrapartum Procedures: epidural, laceration 1st, pitocin augmentation, and placement of intrauterine catheter   Post partum procedures:  none  Complications:  1st degree perineal laceration                     Discharge Day SOAP Note:  Progress Note - Vaginal Delivery  Hannah Stevens is a 30 y.o. G1P1001 now PP day 1 s/p Vaginal, Vacuum (Extractor) . Delivery was complicated by obesity  Subjective  The patient has the following complaints: has no unusual complaints  Pain is controlled with current medications.   Patient is urinating without difficulty.  She is ambulating well.     Objective  Vital signs: BP 92/61 (BP Location: Right Arm)   Pulse 78   Temp 98 F (36.7 C) (Oral)   Resp 16   Ht 5\' 3"  (1.6 m)   Wt 132.6 kg   LMP 09/22/2020   SpO2 98%   Breastfeeding Unknown   BMI 51.80 kg/m   Physical Exam: Gen: NAD Fundus Fundal Tone: Firm  Lochia Amount: Scant        Data Review Labs: Lab Results  Component Value Date   WBC 10.1 06/25/2021   HGB 12.4 06/25/2021   HCT 35.1 (L) 06/25/2021   MCV 83.8 06/25/2021   PLT 264 06/25/2021   CBC Latest Ref Rng & Units 06/25/2021 04/30/2021 04/10/2021  WBC 4.0 - 10.5 K/uL 10.1 9.8 10.7  Hemoglobin 12.0 - 15.0 g/dL 04/12/2021 35.0 09.3  Hematocrit 36.0 - 46.0 % 35.1(L) 35.7 38.1  Platelets 150 - 400 K/uL 264 312 310   O POS Performed at [facility], [address] [place], [place]  [place]   [address] Score: No flowsheet data found.  Assessment/Plan  Active Problems:   Labor and delivery, indication for care    Plan for discharge today.  Discharge Instructions: Per After Visit Summary. Activity: Advance as tolerated. Pelvic rest for 6 weeks.  Also refer to After Visit Summary Diet: Regular Medications: Allergies as of 06/27/2021   No Known Allergies      Medication List     STOP taking these medications    aspirin EC 81 MG tablet       TAKE these medications    ferrous sulfate 325 (65 FE) MG tablet Take 1 tablet (325 mg total) by mouth daily with breakfast.   FIBER ADULT GUMMIES PO Take by mouth.   ibuprofen 600 MG tablet Commonly known as: ADVIL Take 1 tablet (600 mg total) by mouth every 6 (six) hours.   PRE-NATAL PO Take by mouth daily.       Outpatient follow up:  Postpartum contraception: Will discuss at first office visit post-partum  Discharged Condition: good  Discharged to: home  Newborn Data: Disposition:home with mother  Apgars: APGAR (1  MIN): 7   APGAR (5 MINS): 9   APGAR (10 MINS):    Baby Feeding: Breast    Doreene Burke, CNM  06/27/2021 7:39 AM

## 2021-06-27 NOTE — Anesthesia Postprocedure Evaluation (Signed)
Anesthesia Post Note  Patient: Hannah Stevens  Procedure(s) Performed: AN AD HOC LABOR EPIDURAL  Patient location during evaluation: Mother Baby Anesthesia Type: Epidural Level of consciousness: awake and alert and oriented Pain management: pain level controlled Vital Signs Assessment: post-procedure vital signs reviewed and stable Respiratory status: respiratory function stable Cardiovascular status: stable Anesthetic complications: no   No notable events documented.   Last Vitals:  Vitals:   06/27/21 0030 06/27/21 0420  BP: (!) 106/58 92/61  Pulse: 84 78  Resp: 16 16  Temp: 36.8 C 36.7 C  SpO2: 97% 98%    Last Pain:  Vitals:   06/27/21 0420  TempSrc: Oral  PainSc: 3                  Zachary George

## 2021-06-27 NOTE — Lactation Note (Signed)
This note was copied from a baby's chart. Lactation Consultation Note  Patient Name: Hannah Stevens [id number] Date: 06/27/2021 Reason for consult: Follow-up assessment;Primapara;Term Age:30 hours  Lactation follow-up. Mom desires discharge later today pending outcome of 24hr testing. Baby fed overnight, and had spoon feeding w/ colostrum. Baby has voided and had poop diaper. Feeding better on R than the L. Mom comfortable with hand expression/spoon feeding, positioning and latching of the infant.  Encouraged mom to offer the L breast first, with earliest hunger cues, at the next couple of feedings. Discussed importance of equal stimulation and colostrum removal to aid in building supply. Mom encouraged to continue current feeding efforts.   Reviewed normal newborn feeding behaviors and patterns, early hunger cues, hand expression, and benefits of skin to skin.  Mom had no questions/concerns at this time.  Maternal Data Has patient been taught Hand Expression?: Yes Does the patient have breastfeeding experience prior to this delivery?: No  Feeding Mother's Current Feeding Choice: Breast Milk  LATCH Score                    Lactation Tools Discussed/Used    Interventions Interventions: Breast feeding basics reviewed;Hand express;Breast compression;Hand pump;Education  Discharge Pump: Manual;Personal  Consult Status Consult Status: Follow-up Date: 06/27/21 Follow-up type: In-patient    Danford Bad 06/27/2021, 11:34 AM

## 2021-06-27 NOTE — Progress Notes (Signed)
Patient discharged home with family.  Discharge instructions, when to follow up, and prescriptions reviewed with patient.  Patient verbalized understanding. Patient will be escorted out by auxiliary.   

## 2021-07-17 ENCOUNTER — Encounter: Payer: Self-pay | Admitting: Certified Nurse Midwife

## 2021-07-17 ENCOUNTER — Ambulatory Visit (INDEPENDENT_AMBULATORY_CARE_PROVIDER_SITE_OTHER): Payer: BC Managed Care – PPO | Admitting: Certified Nurse Midwife

## 2021-07-17 ENCOUNTER — Other Ambulatory Visit: Payer: Self-pay

## 2021-07-17 DIAGNOSIS — Z1331 Encounter for screening for depression: Secondary | ICD-10-CM

## 2021-07-17 NOTE — Patient Instructions (Signed)
Postpartum Baby Blues The postpartum period begins right after the birth of a baby. During this time, there is often joy and excitement. It is also a time of many changes in the life of the parents. A mother may feel happy one minute and sad or stressed the next. These feelings of sadness, called the baby blues, usually happen in theperiod right after the baby is born and go away within a week or two. What are the causes? The exact cause of this condition is not known. Changes in hormone levels afterchildbirth are believed to trigger some of the symptoms. Other factors that can play a role in these mood changes include: Lack of sleep. Stressful life events, such as financial problems, caring for a loved one, or death of a loved one. Genetics. What are the signs or symptoms? Symptoms of this condition include: Changes in mood, such as going from extreme happiness to sadness. A decrease in concentration. Difficulty sleeping. Crying spells and tearfulness. Loss of appetite. Irritability. Anxiety. If these symptoms last for more than 2 weeks or become more severe, you mayhave postpartum depression. How is this diagnosed? This condition is diagnosed based on an evaluation of your symptoms. Your health care provider may use a screening tool that includes a list of questionsto help identify a person with the baby blues or postpartum depression. How is this treated? The baby blues usually go away on their own in 1-2 weeks. Social support isoften what is needed. You will be encouraged to get adequate sleep and rest. Follow these instructions at home: Lifestyle     Get as much rest as you can. Take a nap when the baby sleeps. Exercise regularly as told by your health care provider. Some women find yoga and walking to be helpful. Eat a balanced and nourishing diet. This includes plenty of fruits and vegetables, whole grains, and lean proteins. Do little things that you enjoy. Take a bubble bath,  read your favorite magazine, or listen to your favorite music. Avoid alcohol. Ask for help with household chores, cooking, grocery shopping, or running errands. Do not try to do everything yourself. Consider hiring a postpartum doula to help. This is a professional who specializes in providing support to new mothers. Try not to make any major life changes during pregnancy or right after giving birth. This can add stress. General instructions Talk to people close to you about how you are feeling. Get support from your partner, family members, friends, or other new moms. You may want to join a support group. Find ways to manage stress. This may include: Writing your thoughts and feelings in a journal. Spending time outside. Spending time with people who make you laugh. Try to stay positive in how you think. Think about the things you are grateful for. Take over-the-counter and prescription medicines only as told by your health care provider. Let your health care provider know if you have any concerns. Keep all postpartum visits. This is important. Contact a health care provider if: Your baby blues do not go away after 2 weeks. Get help right away if: You have thoughts of taking your own life (suicidal thoughts), or of harming your baby or someone else. You see or hear things that are not there (hallucinations). If you ever feel like you may hurt yourself or others, or have thoughts about taking your own life, get help right away. Go to your nearest emergency department or: Call your local emergency services (911 in the [place] Call a   suicide crisis helpline, such as the National Suicide Prevention Lifeline, at 1-800-273-8255. This is open 24 hours a day in the [place] Text the Crisis Text Line at 741741 (in the [place] Summary After giving birth, you may feel happy one minute and sad or stressed the next. Feelings of sadness that happen right after the baby is born and go away after a week or two  are called the baby blues. You can manage the baby blues by getting enough rest, eating a healthy diet, exercising, spending time with supportive people, and finding ways to manage stress. If feelings of sadness and stress last longer than 2 weeks or get in the way of caring for your baby, talk with your health care provider. This may mean you have postpartum depression. This information is not intended to replace advice given to you by your health care provider. Make sure you discuss any questions you have with your healthcare provider. Document Revised: 03/24/2020 Document Reviewed: 03/24/2020 Elsevier Patient Education  2022 Elsevier Inc.  

## 2021-07-17 NOTE — Progress Notes (Signed)
Virtual Visit via Telephone Note  I connected with Hannah Stevens on 07/17/21 at 11:30 AM EDT by telephone and verified that I am speaking with the correct person using two identifiers.  Location: Patient: at home Provider: at office   I discussed the limitations, risks, security and privacy concerns of performing an evaluation and management service by telephone and the availability of in person appointments. I also discussed with the patient that there may be a patient responsible charge related to this service. The patient expressed understanding and agreed to proceed.   History of Present Illness: 2 week postpartum mood screening. Pt had SVD 06/26/21 @ 39.5 wks .    Observations/Objective: PT doing well over all, Her bleeding has declined and her pain is minimal.  Baby is nursing well. She nurses in the morning and at night and pumps during the day. States her mood is good. She gets a little nervous when her partner leave and it is just her and the baby but she is getting more comfortable with it.   Assessment and Plan: GAD 7 : Generalized Anxiety Score 07/17/2021  Nervous, Anxious, on Edge 1  Control/stop worrying 1  Worry too much - different things 0  Trouble relaxing 0  Restless 0  Easily annoyed or irritable 0  Afraid - awful might happen 0  Total GAD 7 Score 2  Anxiety Difficulty Not difficult at all    PHQ9 SCORE ONLY 07/17/2021 05/03/2021 02/12/2021  PHQ-9 Total Score 0 0 0     Follow Up Instructions: As scheduled 4 wk at the office    I discussed the assessment and treatment plan with the patient. The patient was provided an opportunity to ask questions and all were answered. The patient agreed with the plan and demonstrated an understanding of the instructions.   The patient was advised to call back or seek an in-person evaluation if the symptoms worsen or if the condition fails to improve as anticipated.  I provided 7 minutes of non-face-to-face time during this  encounter.   Doreene Burke, CNM

## 2021-08-08 ENCOUNTER — Other Ambulatory Visit: Payer: Self-pay

## 2021-08-08 ENCOUNTER — Ambulatory Visit (INDEPENDENT_AMBULATORY_CARE_PROVIDER_SITE_OTHER): Payer: BC Managed Care – PPO | Admitting: Certified Nurse Midwife

## 2021-08-08 ENCOUNTER — Encounter: Payer: Self-pay | Admitting: Certified Nurse Midwife

## 2021-08-08 NOTE — Progress Notes (Signed)
Subjective:    Hannah Stevens is a 30 y.o. G63P1001 Caucasian female who presents for a postpartum visit. She is 6 weeks postpartum following a spontaneous vaginal delivery at 39.5 gestational weeks. Anesthesia: epidural. I have fully reviewed the prenatal and intrapartum course. Postpartum course has been normal. Baby's course has been normal. Baby is feeding by  pumping . Bleeding  spotting . Bowel function is normal. Bladder function is normal. Patient is not sexually active. Last sexual activity: prior to delivery. Contraception method is  pull out method . Postpartum depression screening: negative. Score 0.  Last pap 06/29/18 and was negative.  The following portions of the patient's history were reviewed and updated as appropriate: allergies, current medications, past medical history, past surgical history and problem list.  Review of Systems Pertinent items are noted in HPI.   Vitals:   08/08/21 1055  BP: (!) 145/81  Pulse: 89  Weight: 267 lb 14.4 oz (121.5 kg)  Height: 5\' 3"  (1.6 m)   No LMP recorded (lmp unknown).  Objective:   General:  alert, cooperative and no distress   Breasts:  deferred, no complaints  Lungs: clear to auscultation bilaterally  Heart:  regular rate and rhythm  Abdomen: soft, nontender   Vulva: normal  Vagina: normal vagina  Cervix:  closed  Corpus: Well-involuted  Adnexa:  Non-palpable  Rectal Exam: none hemorrhoids        Assessment:   Postpartum exam 6 wks s/p SVD Pumping breast milk  Depression screening Contraception counseling   Plan:  : none Follow up in: 3 months for annual or earlier if needed  , CNM

## 2021-08-08 NOTE — Patient Instructions (Signed)
Preventive Care 21-30 Years Old, Female Preventive care refers to lifestyle choices and visits with your health care provider that can promote health and wellness. This includes: A yearly physical exam. This is also called an annual wellness visit. Regular dental and eye exams. Immunizations. Screening for certain conditions. Healthy lifestyle choices, such as: Eating a healthy diet. Getting regular exercise. Not using drugs or products that contain nicotine and tobacco. Limiting alcohol use. What can I expect for my preventive care visit? Physical exam Your health care provider may check your: Height and weight. These may be used to calculate your BMI (body mass index). BMI is a measurement that tells if you are at a healthy weight. Heart rate and blood pressure. Body temperature. Skin for abnormal spots. Counseling Your health care provider may ask you questions about your: Past medical problems. Family's medical history. Alcohol, tobacco, and drug use. Emotional well-being. Home life and relationship well-being. Sexual activity. Diet, exercise, and sleep habits. Work and work environment. Access to firearms. Method of birth control. Menstrual cycle. Pregnancy history. What immunizations do I need? Vaccines are usually given at various ages, according to a schedule. Your health care provider will recommend vaccines for you based on your age, medical history, and lifestyle or other factors, such as travel or where you work. What tests do I need? Blood tests Lipid and cholesterol levels. These may be checked every 5 years starting at age 20. Hepatitis C test. Hepatitis B test. Screening Diabetes screening. This is done by checking your blood sugar (glucose) after you have not eaten for a while (fasting). STD (sexually transmitted disease) testing, if you are at risk. BRCA-related cancer screening. This may be done if you have a family history of breast, ovarian, tubal, or  peritoneal cancers. Pelvic exam and Pap test. This may be done every 3 years starting at age 21. Starting at age 30, this may be done every 5 years if you have a Pap test in combination with an HPV test. Talk with your health care provider about your test results, treatment options, and if necessary, the need for more tests. Follow these instructions at home: Eating and drinking  Eat a healthy diet that includes fresh fruits and vegetables, whole grains, lean protein, and low-fat dairy products. Take vitamin and mineral supplements as recommended by your health care provider. Do not drink alcohol if: Your health care provider tells you not to drink. You are pregnant, may be pregnant, or are planning to become pregnant. If you drink alcohol: Limit how much you have to 0-1 drink a day. Be aware of how much alcohol is in your drink. In the [place] one drink equals one 12 oz bottle of beer (355 mL), one 5 oz glass of wine (148 mL), or one 1 oz glass of hard liquor (44 mL). Lifestyle Take daily care of your teeth and gums. Brush your teeth every morning and night with fluoride toothpaste. Floss one time each day. Stay active. Exercise for at least 30 minutes 5 or more days each week. Do not use any products that contain nicotine or tobacco, such as cigarettes, e-cigarettes, and chewing tobacco. If you need help quitting, ask your health care provider. Do not use drugs. If you are sexually active, practice safe sex. Use a condom or other form of protection to prevent STIs (sexually transmitted infections). If you do not wish to become pregnant, use a form of birth control. If you plan to become pregnant, see your health care provider   for a prepregnancy visit. Find healthy ways to cope with stress, such as: Meditation, yoga, or listening to music. Journaling. Talking to a trusted person. Spending time with friends and family. Safety Always wear your seat belt while driving or riding in a  vehicle. Do not drive: If you have been drinking alcohol. Do not ride with someone who has been drinking. When you are tired or distracted. While texting. Wear a helmet and other protective equipment during sports activities. If you have firearms in your house, make sure you follow all gun safety procedures. Seek help if you have been physically or sexually abused. What's next? Go to your health care provider once a year for an annual wellness visit. Ask your health care provider how often you should have your eyes and teeth checked. Stay up to date on all vaccines. This information is not intended to replace advice given to you by your health care provider. Make sure you discuss any questions you have with your health care provider. Document Revised: 12/08/2020 Document Reviewed: 06/11/2018 Elsevier Patient Education  2022 Elsevier Inc.  

## 2021-11-05 ENCOUNTER — Other Ambulatory Visit (HOSPITAL_COMMUNITY)
Admission: RE | Admit: 2021-11-05 | Discharge: 2021-11-05 | Disposition: A | Discharge status: Home / Self Care | Payer: BC Managed Care – PPO | Source: Ambulatory Visit | Attending: Certified Nurse Midwife | Admitting: Certified Nurse Midwife

## 2021-11-05 ENCOUNTER — Encounter: Payer: Self-pay | Admitting: Certified Nurse Midwife

## 2021-11-05 ENCOUNTER — Ambulatory Visit (INDEPENDENT_AMBULATORY_CARE_PROVIDER_SITE_OTHER): Payer: BC Managed Care – PPO | Admitting: Certified Nurse Midwife

## 2021-11-05 ENCOUNTER — Other Ambulatory Visit: Payer: Self-pay

## 2021-11-05 VITALS — BP 127/87 | HR 80 | Ht 63.0 in | Wt 273.0 lb

## 2021-11-05 DIAGNOSIS — Z23 Encounter for immunization: Secondary | ICD-10-CM

## 2021-11-05 DIAGNOSIS — Z01419 Encounter for gynecological examination (general) (routine) without abnormal findings: Secondary | ICD-10-CM | POA: Insufficient documentation

## 2021-11-05 MED ORDER — TETANUS-DIPHTH-ACELL PERTUSSIS 5-2.5-18.5 LF-MCG/0.5 IM SUSY
0.5000 mL | PREFILLED_SYRINGE | Freq: Once | INTRAMUSCULAR | Status: AC
  Administered 2021-11-05: 0.5 mL via INTRAMUSCULAR

## 2021-11-05 MED ORDER — CITALOPRAM HYDROBROMIDE 10 MG PO TABS: 10.0000 mg | ORAL_TABLET | Freq: Every day | ORAL | 2 refills | Status: DC

## 2021-11-05 NOTE — Addendum Note (Signed)
Addended by: Ova Freshwater on: 11/05/2021 11:41 AM   Modules accepted: Orders

## 2021-11-05 NOTE — Progress Notes (Signed)
GYNECOLOGY ANNUAL PREVENTATIVE CARE ENCOUNTER NOTE  History:     Hannah Stevens is a 31 y.o. G38P1001 female here for a routine annual gynecologic exam.  Current complaints: anxiety, pt state she has always had it but since becoming a mom it has increased drastically. She is afraid to be alone with the baby , prefers her partner to be with her just incase. She has intrusive thoughts.    Denies abnormal vaginal bleeding, discharge, pelvic pain, problems with intercourse or other gynecologic concerns.     Social Relationship: married  Living: spouse and child  Work: International aid/development worker Exercise: walks a lot at job Smoke/Alcohol/drug use: occasional alcohol, denies drugs and smoking   Gynecologic History No LMP recorded (lmp unknown). Contraception: condoms Last Pap: 06/29/2018. Results were: normal Last mammogram: n/a .  Obstetric History OB History  Gravida Para Term Preterm AB Living  1 1 1  0 0 1  SAB IAB Ectopic Multiple Live Births  0 0 0 0 1    # Outcome Date GA Lbr Len/2nd Weight Sex Delivery Anes PTL Lv  1 Term 06/26/21 [redacted]w[redacted]d 171:40 6 lb 13.7 oz (3.11 kg) F Vag-Vacuum EPI  LIV    Obstetric Comments  1st Menstrual Cycle: 9   1st Pregnancy: N/A    Past Medical History:  Diagnosis Date   Anxiety    Ovarian cyst    Pelvic pain    Vaccine for human papilloma virus (HPV) types 6, 11, 16, and 18 administered     Past Surgical History:  Procedure Laterality Date   TONSILLECTOMY     WISDOM TOOTH EXTRACTION      Current Outpatient Medications on File Prior to Visit  Medication Sig Dispense Refill   ferrous sulfate 325 (65 FE) MG tablet Take 1 tablet (325 mg total) by mouth daily with breakfast. 30 tablet 3   Prenatal Multivit-Min-Fe-FA (PRE-NATAL PO) Take by mouth daily.      FIBER ADULT GUMMIES PO Take by mouth. (Patient not taking: Reported on 11/05/2021)     ibuprofen (ADVIL) 600 MG tablet Take 1 tablet (600 mg total) by mouth every 6 (six) hours. (Patient not  taking: Reported on 07/17/2021) 30 tablet 0   No current facility-administered medications on file prior to visit.    No Known Allergies  Social History:  reports that she quit smoking about 7 years ago. Her smoking use included cigarettes. She has never used smokeless tobacco. She reports that she does not currently use alcohol. She reports that she does not use drugs.  Family History  Problem Relation Age of Onset   Diabetes Father     The following portions of the patient's history were reviewed and updated as appropriate: allergies, current medications, past family history, past medical history, past social history, past surgical history and problem list.  Review of Systems Pertinent items noted in HPI and remainder of comprehensive ROS otherwise negative.  Physical Exam:  BP 127/87    Pulse 80    Ht 5\' 3"  (1.6 m)    Wt 273 lb (123.8 kg)    LMP  (LMP Unknown)    Breastfeeding Yes    BMI 48.36 kg/m  CONSTITUTIONAL: Well-developed, well-nourished, obese female in no acute distress.  HENT:  Normocephalic, atraumatic, External right and left ear normal. Oropharynx is clear and moist EYES: Conjunctivae and EOM are normal. Pupils are equal, round, and reactive to light. No scleral icterus.  NECK: Normal range of motion, supple, no masses.  Normal  thyroid.  SKIN: Skin is warm and dry. No rash noted. Not diaphoretic. No erythema. No pallor. MUSCULOSKELETAL: Normal range of motion. No tenderness.  No cyanosis, clubbing, or edema.  2+ distal pulses. NEUROLOGIC: Alert and oriented to person, place, and time. Normal reflexes, muscle tone coordination.  PSYCHIATRIC: Normal mood and affect. Normal behavior. Normal judgment and thought content. CARDIOVASCULAR: Normal heart rate noted, regular rhythm RESPIRATORY: Clear to auscultation bilaterally. Effort and breath sounds normal, no problems with respiration noted. BREASTS: Symmetric in size. No masses, tenderness, skin changes, nipple drainage,  or lymphadenopathy bilaterally.  ABDOMEN: Soft, no distention noted.  No tenderness, rebound or guarding.  PELVIC: Normal appearing external genitalia and urethral meatus; normal appearing vaginal mucosa and cervix.  No abnormal discharge noted.  Pap smear obtained.  Normal uterine size, no other palpable masses, no uterine or adnexal tenderness.  .   Assessment and Plan:    1. Well woman exam with routine gynecological exam   Pap: Will follow up results of pap smear and manage accordingly. Mammogram : n/a  Labs: none Tdap & flu today  Refills/orders : citalopram 10 mg po daily Anxiety  Referral: declines counseling due to no time to go to visit. ( Has done in the past)  Routine preventative health maintenance measures emphasized. Please refer to After Visit Summary for other counseling recommendations.      Doreene Burke, CNM [facility],  [facility]

## 2021-11-05 NOTE — Addendum Note (Signed)
Addended by: Lady Deutscher on: 11/05/2021 10:49 AM   Modules accepted: Orders

## 2021-11-07 LAB — CYTOLOGY - PAP
Comment: NEGATIVE
Diagnosis: NEGATIVE
High risk HPV: NEGATIVE

## 2021-11-29 ENCOUNTER — Other Ambulatory Visit: Payer: Self-pay | Admitting: Certified Nurse Midwife

## 2021-12-03 ENCOUNTER — Telehealth (INDEPENDENT_AMBULATORY_CARE_PROVIDER_SITE_OTHER): Payer: BC Managed Care – PPO | Admitting: Certified Nurse Midwife

## 2021-12-03 VITALS — Ht 63.0 in | Wt 270.0 lb

## 2021-12-03 DIAGNOSIS — Z79899 Other long term (current) drug therapy: Secondary | ICD-10-CM | POA: Diagnosis not present

## 2021-12-03 MED ORDER — CITALOPRAM HYDROBROMIDE 10 MG PO TABS: 10.0000 mg | ORAL_TABLET | Freq: Every day | ORAL | 2 refills | Status: DC

## 2021-12-03 NOTE — Progress Notes (Signed)
Virtual Visit via Telephone Note  I connected with Ewell Poe on 12/03/21 at 11:30 AM EST by telephone ( attempted video visit however, there were technical difficulties)  and verified that I am speaking with the correct person using two identifiers.  Location: Patient: in care Provider: at office    I discussed the limitations, risks, security and privacy concerns of performing an evaluation and management service by telephone and the availability of in person appointments. I also discussed with the patient that there may be a patient responsible charge related to this service. The patient expressed understanding and agreed to proceed.   History of Present Illness: Pt was started on Citlopram 10 mg daily due to anxiety. States she has having intrusive thoughts and was afraid to be alone with the baby because she was worried that something would happen . Ie baby would choke or, someone would try to break into her home. She was having difficult doing daily activities.    Observations/Objective: State she feels much better, not as anxious , not have the thoughts , feels more like herself  Assessment and Plan: Continue on current dose. Should things change she can reach out to me as needed.   GAD 7 : Generalized Anxiety Score 12/03/2021 11/05/2021 07/17/2021  Nervous, Anxious, on Edge 0 1 1  Control/stop worrying 0 1 1  Worry too much - different things 0 1 0  Trouble relaxing 0 0 0  Restless 0 0 0  Easily annoyed or irritable 0 0 0  Afraid - awful might happen 0 1 0  Total GAD 7 Score 0 4 2  Anxiety Difficulty - Not difficult at all Not difficult at all    Flowsheet Row Video Visit from 12/03/2021 in [facility]  PHQ-9 Total Score 0        I discussed the assessment and treatment plan with the patient. The patient was provided an opportunity to ask questions and all were answered. The patient agreed with the plan and demonstrated an understanding of the instructions.    The patient was advised to call back or seek an in-person evaluation if the symptoms worsen or if the condition fails to improve as anticipated.  I provided 7 minutes of non-face-to-face time during this encounter.   Philip Aspen, CNM

## 2021-12-20 ENCOUNTER — Ambulatory Visit: Payer: BC Managed Care – PPO | Admitting: Podiatry

## 2021-12-27 ENCOUNTER — Other Ambulatory Visit: Payer: Self-pay

## 2021-12-27 ENCOUNTER — Ambulatory Visit: Payer: BC Managed Care – PPO | Admitting: Podiatry

## 2021-12-27 ENCOUNTER — Encounter: Payer: Self-pay | Admitting: Podiatry

## 2021-12-27 DIAGNOSIS — L600 Ingrowing nail: Secondary | ICD-10-CM | POA: Diagnosis not present

## 2021-12-27 NOTE — Progress Notes (Signed)
?  Subjective:  ?Patient ID: Hannah Stevens, female    DOB: May 31, 1991,  MRN: [id number] ? ?Chief Complaint  ?Patient presents with  ? Ingrown Toenail  ? ? ?31 y.o. female presents with the above complaint.  Patient presents with complaint of right medial border ingrown.  Patient states that she had the other side removed in the past.  She states that medial side is hurting her.  She would like to have it removed.  She denies any other acute complaints.  Her pain scale 7 out of 10 is progressive gotten worse.  She was not able to undergo phenol matricectomy because she was pregnant. ? ? ?Review of Systems: Negative except as noted in the HPI. Denies N/V/F/Ch. ? ?Past Medical History:  ?Diagnosis Date  ? Anxiety   ? Ovarian cyst   ? Pelvic pain   ? Vaccine for human papilloma virus (HPV) types 6, 11, 16, and 18 administered   ? ? ?Current Outpatient Medications:  ?  citalopram (CELEXA) 10 MG tablet, Take 1 tablet (10 mg total) by mouth daily., Disp: 90 tablet, Rfl: 2 ?  FIBER ADULT GUMMIES PO, Take by mouth., Disp: , Rfl:  ?  Prenatal Multivit-Min-Fe-FA (PRE-NATAL PO), Take by mouth daily. , Disp: , Rfl:  ? ?Social History  ? ?Tobacco Use  ?Smoking Status Former  ? Years: 1.00  ? Types: Cigarettes  ? Quit date: 10/14/2014  ? Years since quitting: 7.2  ?Smokeless Tobacco Never  ? ? ?No Known Allergies ?Objective:  ?There were no vitals filed for this visit. ?There is no height or weight on file to calculate BMI. ?Constitutional Well developed. ?Well nourished.  ?Vascular Dorsalis pedis pulses palpable bilaterally. ?Posterior tibial pulses palpable bilaterally. ?Capillary refill normal to all digits.  ?No cyanosis or clubbing noted. ?Pedal hair growth normal.  ?Neurologic Normal speech. ?Oriented to person, place, and time. ?Epicritic sensation to light touch grossly present bilaterally.  ?Dermatologic Painful ingrowing nail at medial nail borders of the hallux nail right. ?No other open wounds. ?No skin lesions.   ?Orthopedic: Normal joint ROM without pain or crepitus bilaterally. ?No visible deformities. ?No bony tenderness.  ? ?Radiographs: None ?Assessment:  ? ?1. Ingrowing right great toenail   ? ?Plan:  ?Patient was evaluated and treated and all questions answered. ? ?Ingrown Nail, right ?-Patient elects to proceed with minor surgery to remove ingrown toenail removal today. Consent reviewed and signed by patient. ?-Ingrown nail excised. See procedure note. ?-Educated on post-procedure care including soaking. Written instructions provided and reviewed. ?-Patient to follow up in 2 weeks for nail check. ? ?Procedure: Excision of Ingrown Toenail ?Location: Right 1st toe medial nail borders. ?Anesthesia: Lidocaine 1% plain; 1.5 mL and Marcaine 0.5% plain; 1.5 mL, digital block. ?Skin Prep: Betadine. ?Dressing: Silvadene; telfa; dry, sterile, compression dressing. ?Technique: Following skin prep, the toe was exsanguinated and a tourniquet was secured at the base of the toe. The affected nail border was freed, split with a nail splitter, and excised. Chemical matrixectomy was not performed due to breast-feeding.  The tourniquet was then removed and sterile dressing applied. ?Disposition: Patient tolerated procedure well. Patient to return in 2 weeks for follow-up.  ? ?No follow-ups on file. ?

## 2022-01-09 ENCOUNTER — Encounter: Payer: Self-pay | Admitting: Certified Nurse Midwife

## 2022-05-07 ENCOUNTER — Telehealth (HOSPITAL_COMMUNITY): Payer: Self-pay | Admitting: Lactation Services

## 2022-05-07 NOTE — Telephone Encounter (Signed)
Hannah Stevens called inquiring about how to donate her breast milk.   LC called back and left a vm with phone number and email of [facility].

## 2022-06-13 IMAGING — US US OB COMP +14 WK
1 series · 13 of 28 positions shown · non-contrast
Comparison: none

CLINICAL DATA: Scan for anatomy. 29-year-old gravida 1 para 0 with
assigned EDC of 06/28/2021.

EXAM:
OBSTETRICAL ULTRASOUND >14 WKS

[Series 1: us ob comp + 14 wk · 13 of 90 slices shown]
[im 4/90]
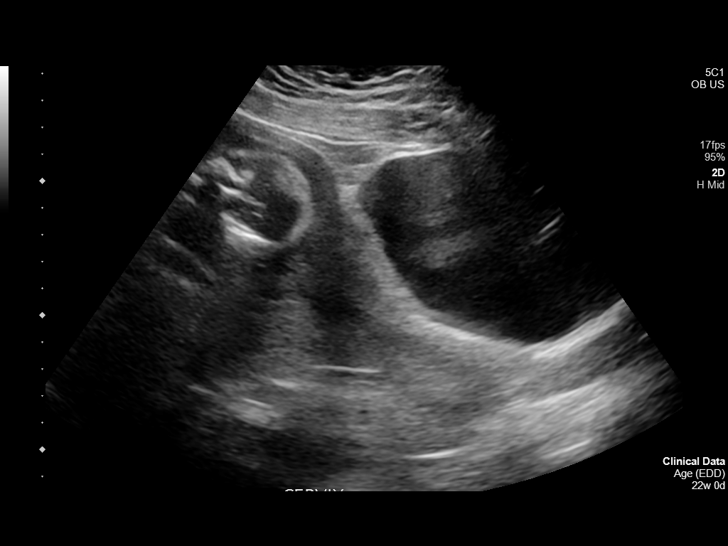
[im 10/90]
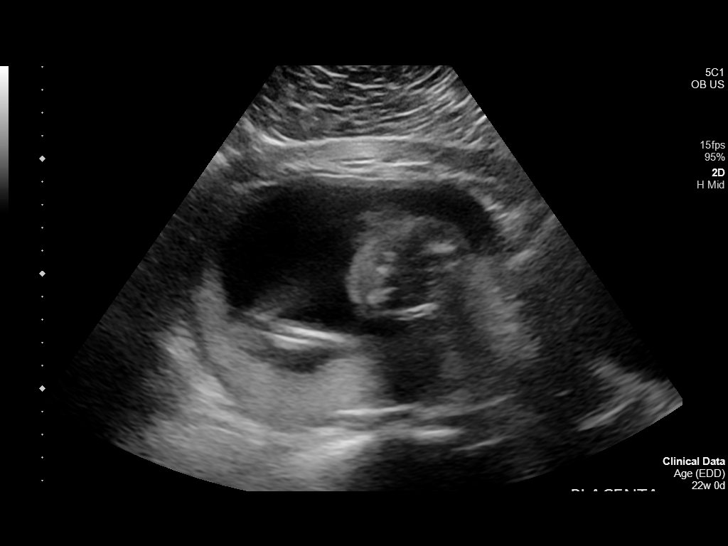
[im 17/90]
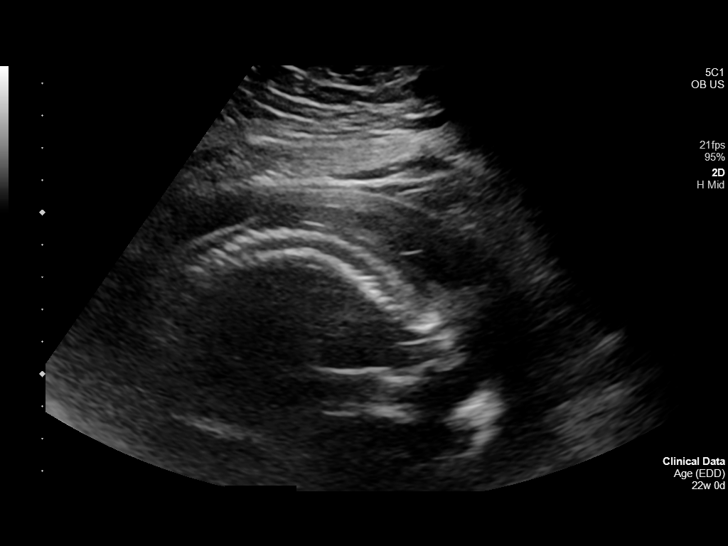
[im 24/90]
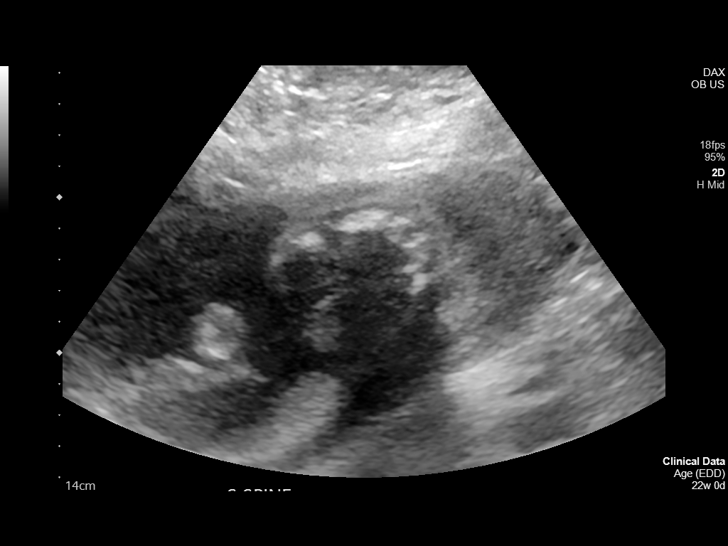
[im 30/90]
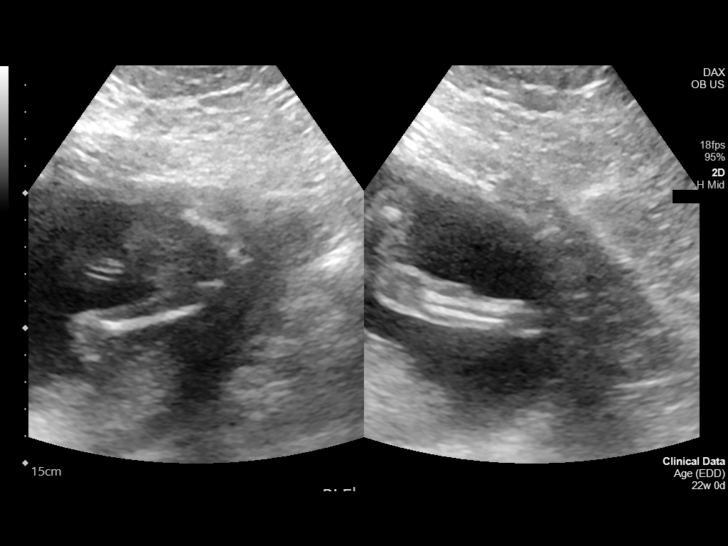
[im 37/90]
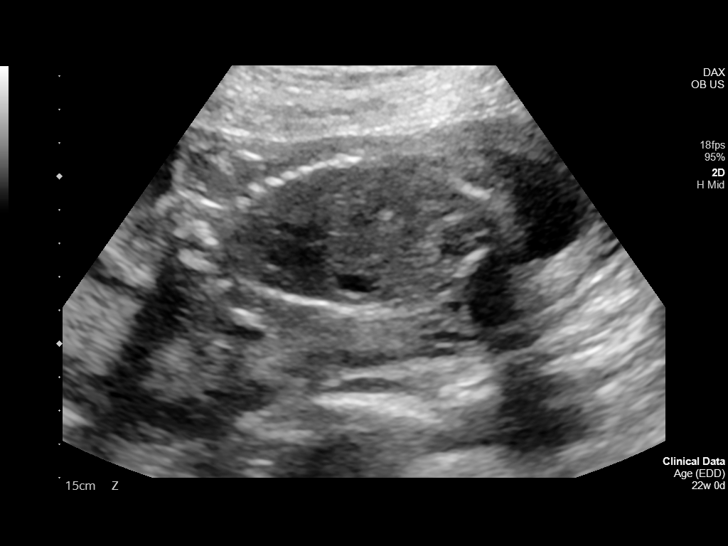
[im 47/90]
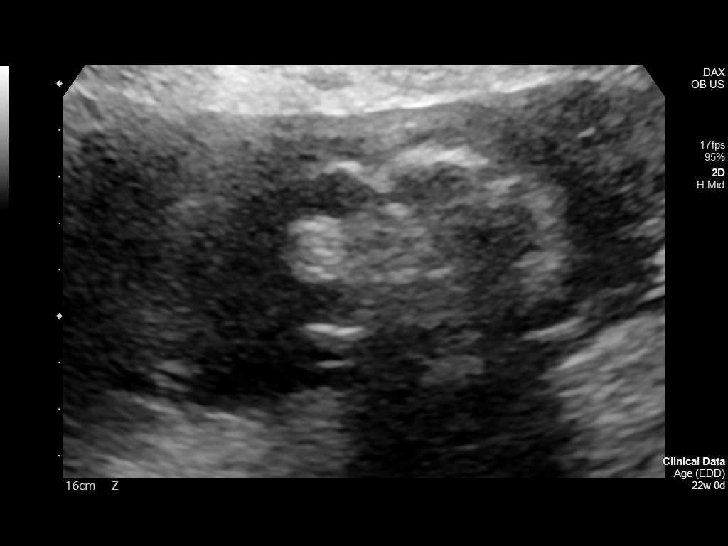
[im 53/90]
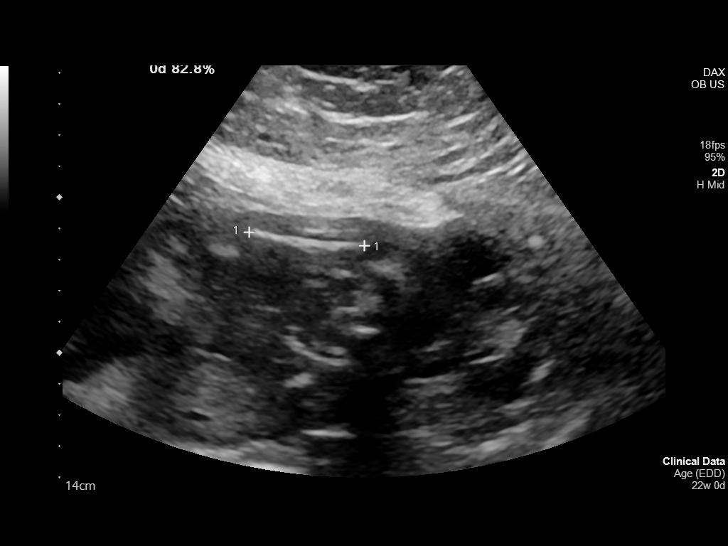
[im 60/90]
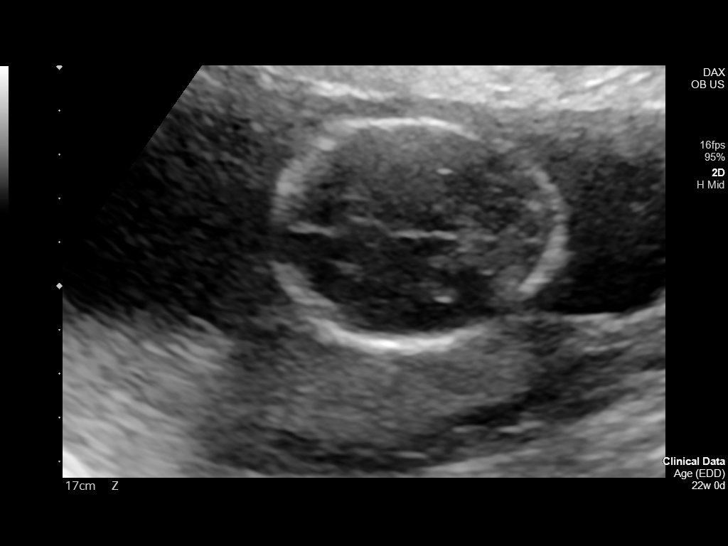
[im 66/90]
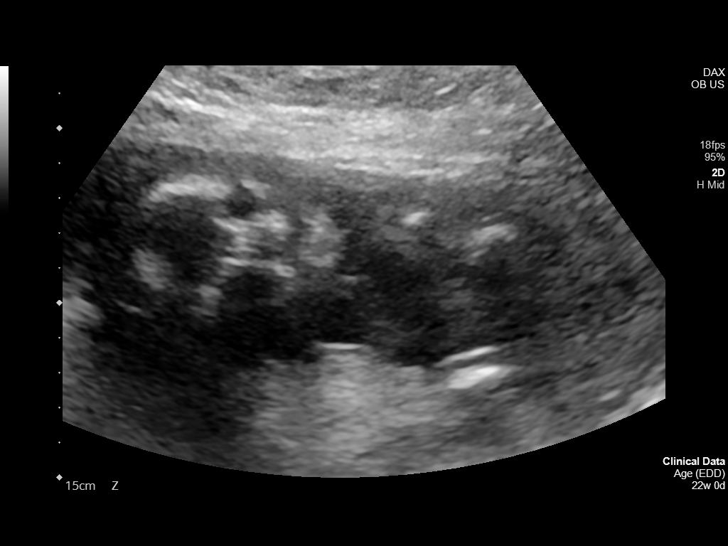
[im 73/90]
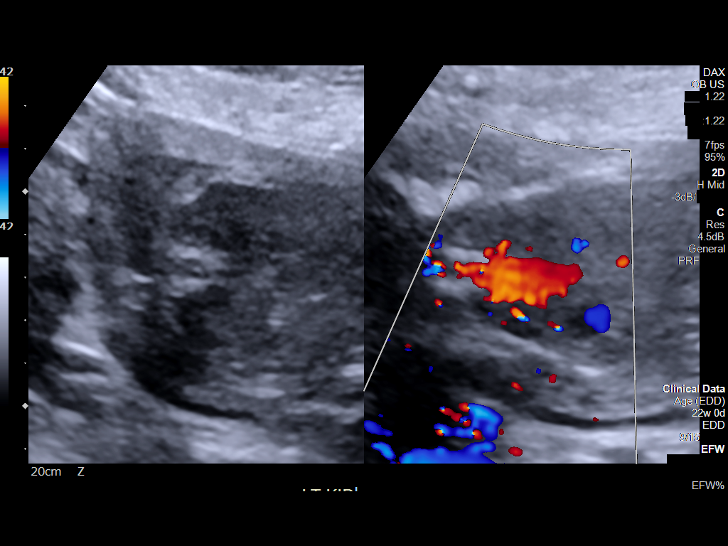
[im 80/90]
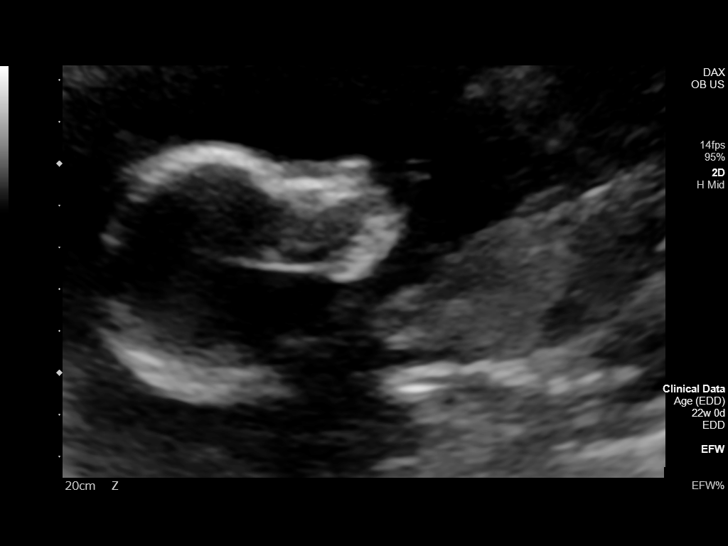
[im 86/90]
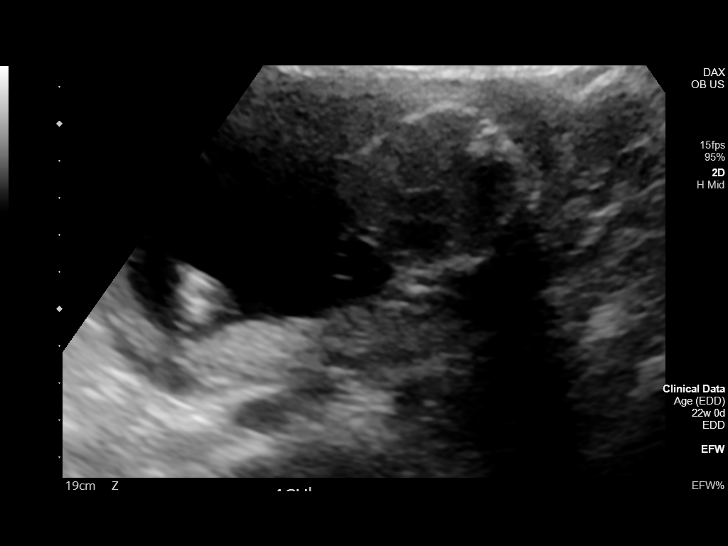

[13 of 28 positions shown; findings below may reference images not displayed]

FINDINGS: Number of Fetuses: 1

Heart Rate:  166 bpm

Movement: Present

Presentation: Transverse

Previa: None

Placental Location: Posterior

Amniotic Fluid (Subjective): Normal

Amniotic Fluid (Objective):

Vertical pocket = 5.2cm

FETAL BIOMETRY

BPD: 5.18cm 21w 5d

HC:   19.05cm 21w 6d

AC:   17.31cm 22w 2d

FL:   3.88cm 22w 3d

Current Mean GA: 22w 2d US EDC: 06/26/2021

Assigned GA:  22w 0d Assigned EDC: 06/28/2021

FETAL ANATOMY

Lateral Ventricles: Appears normal

Thalami/CSP: Appears normal

Posterior Fossa:  Appears normal

Nuchal Region: Not applicable

Upper Lip: Appears normal

Spine: Appears normal

4 Chamber Heart on Left: Limited views appear normal

LVOT: Not visualized

RVOT: Not visualized

Stomach on Left: Appears normal

3 Vessel Cord: Appears normal

Cord Insertion site: Appears normal

Kidneys: Appears normal

Bladder: Appears normal

Extremities: Appears normal

Technically difficult due to: Maternal body habitus

Maternal Findings:

Cervix:  6.9 centimeters on transabdominal evaluation
IMPRESSION: Single living intrauterine fetus in transverse presentation.

Size and dates correlate well.

No fetal anomalies are identified. The RIGHT ventricular outflow
tract, aortic, and pulmonary arteries are not fully evaluated due to
maternal body habitus. Consider follow-up scan.

## 2022-06-24 ENCOUNTER — Ambulatory Visit: Payer: BC Managed Care – PPO | Admitting: Physician Assistant

## 2022-06-24 ENCOUNTER — Encounter: Payer: Self-pay | Admitting: Physician Assistant

## 2022-06-24 DIAGNOSIS — E538 Deficiency of other specified B group vitamins: Secondary | ICD-10-CM

## 2022-06-24 DIAGNOSIS — E782 Mixed hyperlipidemia: Secondary | ICD-10-CM | POA: Diagnosis not present

## 2022-06-24 DIAGNOSIS — Z833 Family history of diabetes mellitus: Secondary | ICD-10-CM

## 2022-06-24 DIAGNOSIS — E559 Vitamin D deficiency, unspecified: Secondary | ICD-10-CM

## 2022-06-24 DIAGNOSIS — R946 Abnormal results of thyroid function studies: Secondary | ICD-10-CM | POA: Diagnosis not present

## 2022-06-24 DIAGNOSIS — R5383 Other fatigue: Secondary | ICD-10-CM

## 2022-06-24 DIAGNOSIS — Z6841 Body Mass Index (BMI) 40.0 and over, adult: Secondary | ICD-10-CM

## 2022-06-24 NOTE — Progress Notes (Signed)
[facility] [address] [place], [place] [address]  Internal MEDICINE  Office Visit Note  Patient Name: Hannah Stevens  [id number]  [id number]  Date of Service: 06/24/2022  Chief Complaint  Patient presents with   Follow-up   Anxiety    HPI Pt is here for routine follow up -She had a baby 1 year ago. She is weaning off of pumping now. She just switched baby to whole milk and is not given her any breast milk now. -reports always having difficulty losing weight. -Saxenda made her nauseous before. Has also done phentermine for 1-2 months but then it stops working and she gains the weight back -She wants to lose weight prior to trying to have another baby. Goal 210lbs.  -needs something to help curb appetite. -Put on 30lbs while pregnant but lost this 2 weeks after delivering and then started gaining while breastfeeding. -Using celexa since giving birth for anxiety that worsened after becoming a mom and hopes to maybe come back off of this in future.  -never been diabetic, but has a Fhx of this  Current Medication: Outpatient Encounter Medications as of 06/24/2022  Medication Sig   citalopram (CELEXA) 10 MG tablet Take 1 tablet (10 mg total) by mouth daily.   [DISCONTINUED] FIBER ADULT GUMMIES PO Take by mouth.   [DISCONTINUED] Prenatal Multivit-Min-Fe-FA (PRE-NATAL PO) Take by mouth daily.    No facility-administered encounter medications on file as of 06/24/2022.    Surgical History: Past Surgical History:  Procedure Laterality Date   TONSILLECTOMY     WISDOM TOOTH EXTRACTION      Medical History: Past Medical History:  Diagnosis Date   Anxiety    Ovarian cyst    Pelvic pain    Vaccine for human papilloma virus (HPV) types 6, 11, 16, and 18 administered     Family History: Family History  Problem Relation Age of Onset   Diabetes Father     Social History   Socioeconomic History   Marital status: Married    Spouse name: Not on file   Number of  children: Not on file   Years of education: Not on file   Highest education level: Not on file  Occupational History   Not on file  Tobacco Use   Smoking status: Former    Years: 1.00    Types: Cigarettes    Quit date: 10/14/2014    Years since quitting: 7.6   Smokeless tobacco: Never  Vaping Use   Vaping Use: Never used  Substance and Sexual Activity   Alcohol use: Not Currently    Alcohol/week: 0.0 standard drinks of alcohol    Comment: occasionally   Drug use: No   Sexual activity: Yes  Other Topics Concern   Not on file  Social History Narrative   Not on file   Social Determinants of Health   Financial Resource Strain: Not on file  Food Insecurity: Not on file  Transportation Needs: Not on file  Physical Activity: Not on file  Stress: Not on file  Social Connections: Not on file  Intimate Partner Violence: Not on file      Review of Systems  Constitutional:  Negative for chills, fatigue and unexpected weight change.  HENT:  Negative for congestion, postnasal drip, rhinorrhea, sneezing and sore throat.   Eyes:  Negative for redness.  Respiratory:  Negative for cough, chest tightness and shortness of breath.   Cardiovascular:  Negative for chest pain and palpitations.  Gastrointestinal:  Negative  for abdominal pain, constipation, diarrhea, nausea and vomiting.  Genitourinary:  Negative for dysuria and frequency.  Musculoskeletal:  Negative for arthralgias, back pain, joint swelling and neck pain.  Skin:  Negative for rash.  Neurological: Negative.  Negative for tremors and numbness.  Hematological:  Negative for adenopathy. Does not bruise/bleed easily.  Psychiatric/Behavioral:  Negative for behavioral problems (Depression), sleep disturbance and suicidal ideas. The patient is nervous/anxious.     Vital Signs: Pulse 80   Temp 98.4 F (36.9 C)   Resp 16   Ht 5\' 3"  (1.6 m)   Wt 279 lb 3.2 oz (126.6 kg)   SpO2 98%   BMI 49.46 kg/m    Physical  Exam Constitutional:      General: She is not in acute distress.    Appearance: Normal appearance. She is well-developed. She is obese. She is not diaphoretic.  HENT:     Head: Normocephalic and atraumatic.     Mouth/Throat:     Pharynx: No oropharyngeal exudate.  Eyes:     Pupils: Pupils are equal, round, and reactive to light.  Neck:     Thyroid: No thyromegaly.     Vascular: No JVD.     Trachea: No tracheal deviation.  Cardiovascular:     Rate and Rhythm: Normal rate and regular rhythm.     Heart sounds: Normal heart sounds. No murmur heard.    No friction rub. No gallop.  Pulmonary:     Effort: Pulmonary effort is normal. No respiratory distress.     Breath sounds: No wheezing or rales.  Chest:     Chest wall: No tenderness.  Abdominal:     General: Bowel sounds are normal.     Palpations: Abdomen is soft.  Musculoskeletal:        General: Normal range of motion.     Cervical back: Normal range of motion and neck supple.  Lymphadenopathy:     Cervical: No cervical adenopathy.  Skin:    General: Skin is warm and dry.  Neurological:     Mental Status: She is alert and oriented to person, place, and time.     Cranial Nerves: No cranial nerve deficit.  Psychiatric:        Behavior: Behavior normal.        Thought Content: Thought content normal.        Judgment: Judgment normal.        Assessment/Plan: 1. Morbid obesity with BMI of 45.0-49.9, adult (HCC) Will start by checking labs and plan for metabolic test next visit. Pending results may start medication to aid weight loss in addition to working on diet and exercise  2. FHx: diabetes mellitus Will check A1c - Hgb A1C w/o eAG  3. Mixed hyperlipidemia - Lipid Panel With LDL/HDL Ratio  4. Abnormal thyroid exam - TSH + free T4  5. B12 deficiency - B12 and Folate Panel  6. Vitamin D deficiency - VITAMIN D 25 Hydroxy (Vit-D Deficiency, Fractures)  7. Other fatigue - Hgb A1C w/o eAG - TSH + free T4 -  Lipid Panel With LDL/HDL Ratio - CBC w/Diff/Platelet - Comprehensive metabolic panel - 03-08-1984 and Folate Panel   General Counseling: Hannah Stevens verbalizes understanding of the findings of todays visit and agrees with plan of treatment. I have discussed any further diagnostic evaluation that may be needed or ordered today. We also reviewed her medications today. she has been encouraged to call the office with any questions or concerns that should arise related to  todays visit.    Orders Placed This Encounter  Procedures   Hgb A1C w/o eAG   TSH + free T4   Lipid Panel With LDL/HDL Ratio   CBC w/Diff/Platelet   Comprehensive metabolic panel   J19 and Folate Panel   VITAMIN D 25 Hydroxy (Vit-D Deficiency, Fractures)    No orders of the defined types were placed in this encounter.   This patient was seen by Lynn Ito, PA-C in collaboration with Dr. Beverely Risen as a part of collaborative care agreement.   Total time spent:30 Minutes Time spent includes review of chart, medications, test results, and follow up plan with the patient.      Dr Lyndon Code Internal medicine

## 2022-06-26 DIAGNOSIS — E538 Deficiency of other specified B group vitamins: Secondary | ICD-10-CM | POA: Diagnosis not present

## 2022-06-26 DIAGNOSIS — E782 Mixed hyperlipidemia: Secondary | ICD-10-CM | POA: Diagnosis not present

## 2022-06-26 DIAGNOSIS — R5383 Other fatigue: Secondary | ICD-10-CM | POA: Diagnosis not present

## 2022-06-26 DIAGNOSIS — Z833 Family history of diabetes mellitus: Secondary | ICD-10-CM | POA: Diagnosis not present

## 2022-06-26 DIAGNOSIS — R946 Abnormal results of thyroid function studies: Secondary | ICD-10-CM | POA: Diagnosis not present

## 2022-06-26 DIAGNOSIS — E559 Vitamin D deficiency, unspecified: Secondary | ICD-10-CM | POA: Diagnosis not present

## 2022-06-27 LAB — COMPREHENSIVE METABOLIC PANEL
ALT: 17 IU/L (ref 0–32)
AST: 14 IU/L (ref 0–40)
Albumin/Globulin Ratio: 1.8 (ref 1.2–2.2)
Albumin: 4.3 g/dL (ref 3.9–4.9)
Alkaline Phosphatase: 122 IU/L — ABNORMAL HIGH (ref 44–121)
BUN/Creatinine Ratio: 14 (ref 9–23)
BUN: 12 mg/dL (ref 6–20)
Bilirubin Total: 0.3 mg/dL (ref 0.0–1.2)
CO2: 23 mmol/L (ref 20–29)
Calcium: 9.3 mg/dL (ref 8.7–10.2)
Chloride: 103 mmol/L (ref 96–106)
Creatinine, Ser: 0.85 mg/dL (ref 0.57–1.00)
Globulin, Total: 2.4 g/dL (ref 1.5–4.5)
Glucose: 93 mg/dL (ref 70–99)
Potassium: 4.8 mmol/L (ref 3.5–5.2)
Sodium: 139 mmol/L (ref 134–144)
Total Protein: 6.7 g/dL (ref 6.0–8.5)
eGFR: 94 mL/min/{1.73_m2} (ref >59)

## 2022-06-27 LAB — CBC WITH DIFFERENTIAL/PLATELET
Basophils Absolute: 0 10*3/uL (ref 0.0–0.2)
Basos: 1 %
EOS (ABSOLUTE): 0.1 10*3/uL (ref 0.0–0.4)
Eos: 2 %
Hematocrit: 42.3 % (ref 34.0–46.6)
Hemoglobin: 14.1 g/dL (ref 11.1–15.9)
Immature Grans (Abs): 0 10*3/uL (ref 0.0–0.1)
Immature Granulocytes: 0 %
Lymphocytes Absolute: 2.4 10*3/uL (ref 0.7–3.1)
Lymphs: 43 %
MCH: 28.9 pg (ref 26.6–33.0)
MCHC: 33.3 g/dL (ref 31.5–35.7)
MCV: 87 fL (ref 79–97)
Monocytes Absolute: 0.4 10*3/uL (ref 0.1–0.9)
Monocytes: 7 %
Neutrophils Absolute: 2.7 10*3/uL (ref 1.4–7.0)
Neutrophils: 47 %
Platelets: 282 10*3/uL (ref 150–450)
RBC: 4.88 x10E6/uL (ref 3.77–5.28)
RDW: 12.6 % (ref 11.7–15.4)
WBC: 5.6 10*3/uL (ref 3.4–10.8)

## 2022-06-27 LAB — TSH+FREE T4
Free T4: 1.03 ng/dL (ref 0.82–1.77)
TSH: 1.3 u[IU]/mL (ref 0.450–4.500)

## 2022-06-27 LAB — B12 AND FOLATE PANEL
Folate: 6.2 ng/mL (ref >3.0)
Vitamin B-12: 588 pg/mL (ref 232–1245)

## 2022-06-27 LAB — LIPID PANEL WITH LDL/HDL RATIO
Cholesterol, Total: 171 mg/dL (ref 100–199)
HDL: 43 mg/dL (ref >39)
LDL Chol Calc (NIH): 108 mg/dL — ABNORMAL HIGH (ref 0–99)
LDL/HDL Ratio: 2.5 ratio (ref 0.0–3.2)
Triglycerides: 110 mg/dL (ref 0–149)
VLDL Cholesterol Cal: 20 mg/dL (ref 5–40)

## 2022-06-27 LAB — VITAMIN D 25 HYDROXY (VIT D DEFICIENCY, FRACTURES): Vit D, 25-Hydroxy: 29 ng/mL — ABNORMAL LOW (ref 30.0–100.0)

## 2022-06-27 LAB — HGB A1C W/O EAG: Hgb A1c MFr Bld: 5.4 % (ref 4.8–5.6)

## 2022-07-01 ENCOUNTER — Telehealth: Payer: Self-pay | Admitting: Certified Nurse Midwife

## 2022-07-01 NOTE — Telephone Encounter (Signed)
Patient called and states that she has been trying to ween her baby off of breast feeding now that baby is a year old. Patient states she has been trying cabbage leaves and sudafed x2 wks, extending between pumping and nothing is working. Patient is asking for advise to help stop the milk from producing. Please advise.

## 2022-07-02 NOTE — Telephone Encounter (Signed)
Information relayed to patient

## 2022-07-05 ENCOUNTER — Ambulatory Visit (INDEPENDENT_AMBULATORY_CARE_PROVIDER_SITE_OTHER): Payer: BC Managed Care – PPO | Admitting: Physician Assistant

## 2022-07-05 ENCOUNTER — Encounter: Payer: Self-pay | Admitting: Physician Assistant

## 2022-07-05 DIAGNOSIS — E559 Vitamin D deficiency, unspecified: Secondary | ICD-10-CM

## 2022-07-05 DIAGNOSIS — E782 Mixed hyperlipidemia: Secondary | ICD-10-CM | POA: Diagnosis not present

## 2022-07-05 DIAGNOSIS — Z6841 Body Mass Index (BMI) 40.0 and over, adult: Secondary | ICD-10-CM

## 2022-07-05 MED ORDER — TOPIRAMATE 25 MG PO TABS: 25.0000 mg | ORAL_TABLET | Freq: Every day | ORAL | 0 refills | Status: DC

## 2022-07-05 NOTE — Progress Notes (Unsigned)
[facility] [place], [place] [address]  Internal MEDICINE  Office Visit Note  Patient Name: Hannah Stevens  [id number]  [id number]  Date of Service: 07/10/2022  Chief Complaint  Patient presents with   Follow-up   Medical Management of Chronic Issues    Weight management     HPI Pt is here for follow up to review labs and discuss weight management -She has had some congestion since Tues, been taking mucinex, sudafed, and afrin as needed. Knows to limit this use. Startng to improve a little and will continue OTC treatment. If not improving next week may call back if ABX needed. -Labs reviewed: Low vit D and slightly elevated LDL. Will start on Vit D supplement -Hasnt pumped in 3 days with trying to stop and spreading it out since stopping breast feeding. -She is not breastfeeding at all and baby is on whole milk now.  -Planned to do metabolic test today, however since having congestion will hold off and do next visit instead. Discussed some medication options such as wellbutrin and topamax. Will start on topamax and will work on increasing exercise and restricting calories.  -reports she recently had her menstrual cycle. She is not ready for another kid at this time as she would like to lose weight first -Doesn't want to consider surgery at this time, as she will want another kid. May consider this in distant future if needed  Current Medication: Outpatient Encounter Medications as of 07/05/2022  Medication Sig   citalopram (CELEXA) 10 MG tablet Take 1 tablet (10 mg total) by mouth daily.   topiramate (TOPAMAX) 25 MG tablet Take 1 tablet (25 mg total) by mouth daily.   No facility-administered encounter medications on file as of 07/05/2022.    Surgical History: Past Surgical History:  Procedure Laterality Date   TONSILLECTOMY     WISDOM TOOTH EXTRACTION      Medical History: Past Medical History:  Diagnosis Date   Anxiety    Ovarian cyst    Pelvic  pain    Vaccine for human papilloma virus (HPV) types 6, 68, 72, and 18 administered     Family History: Family History  Problem Relation Age of Onset   Diabetes Father     Social History   Socioeconomic History   Marital status: Married    Spouse name: Not on file   Number of children: Not on file   Years of education: Not on file   Highest education level: Not on file  Occupational History   Not on file  Tobacco Use   Smoking status: Former    Years: 1.00    Types: Cigarettes    Quit date: 10/14/2014    Years since quitting: 7.7   Smokeless tobacco: Never  Vaping Use   Vaping Use: Never used  Substance and Sexual Activity   Alcohol use: Not Currently    Alcohol/week: 0.0 standard drinks of alcohol    Comment: occasionally   Drug use: No   Sexual activity: Yes  Other Topics Concern   Not on file  Social History Narrative   Not on file   Social Determinants of Health   Financial Resource Strain: Not on file  Food Insecurity: Not on file  Transportation Needs: Not on file  Physical Activity: Not on file  Stress: Not on file  Social Connections: Not on file  Intimate Partner Violence: Not on file      Review of Systems  Constitutional:  Negative  for chills, fatigue and unexpected weight change.  HENT:  Negative for congestion, postnasal drip, rhinorrhea, sneezing and sore throat.   Eyes:  Negative for redness.  Respiratory:  Negative for cough, chest tightness and shortness of breath.   Cardiovascular:  Negative for chest pain and palpitations.  Gastrointestinal:  Negative for abdominal pain, constipation, diarrhea, nausea and vomiting.  Genitourinary:  Negative for dysuria and frequency.  Musculoskeletal:  Negative for arthralgias, back pain, joint swelling and neck pain.  Skin:  Negative for rash.  Neurological: Negative.  Negative for tremors and numbness.  Hematological:  Negative for adenopathy. Does not bruise/bleed easily.  Psychiatric/Behavioral:   Negative for behavioral problems (Depression), sleep disturbance and suicidal ideas.     Vital Signs: BP 135/89   Pulse 78   Temp 98.3 F (36.8 C)   Resp 16   Ht 5\' 3"  (1.6 m)   Wt 281 lb 3.2 oz (127.6 kg)   SpO2 99%   BMI 49.81 kg/m    Physical Exam Vitals and nursing note reviewed.  Constitutional:      General: She is not in acute distress.    Appearance: Normal appearance. She is well-developed. She is obese. She is not diaphoretic.  HENT:     Head: Normocephalic and atraumatic.     Mouth/Throat:     Pharynx: No oropharyngeal exudate.  Eyes:     Pupils: Pupils are equal, round, and reactive to light.  Neck:     Thyroid: No thyromegaly.     Vascular: No JVD.     Trachea: No tracheal deviation.  Cardiovascular:     Rate and Rhythm: Normal rate and regular rhythm.     Heart sounds: Normal heart sounds. No murmur heard.    No friction rub. No gallop.  Pulmonary:     Effort: Pulmonary effort is normal. No respiratory distress.     Breath sounds: No wheezing or rales.  Chest:     Chest wall: No tenderness.  Abdominal:     General: Bowel sounds are normal.     Palpations: Abdomen is soft.  Musculoskeletal:        General: Normal range of motion.     Cervical back: Normal range of motion and neck supple.  Lymphadenopathy:     Cervical: No cervical adenopathy.  Skin:    General: Skin is warm and dry.  Neurological:     Mental Status: She is alert and oriented to person, place, and time.     Cranial Nerves: No cranial nerve deficit.  Psychiatric:        Behavior: Behavior normal.        Thought Content: Thought content normal.        Judgment: Judgment normal.        Assessment/Plan: 1. Morbid obesity with BMI of 45.0-49.9, adult (HCC) Will work on increasing exercise and limiting caloric intake. Will plan for metabolic test next visit. Will start on topamax to help aid wt loss goals. - topiramate (TOPAMAX) 25 MG tablet; Take 1 tablet (25 mg total) by mouth  daily.  Dispense: 30 tablet; Refill: 0  2. Vitamin D deficiency Will start vitamin D supplement  3. Mixed hyperlipidemia Continue to work on diet and exercise   General Counseling: Hannah Stevens verbalizes understanding of the findings of todays visit and agrees with plan of treatment. I have discussed any further diagnostic evaluation that may be needed or ordered today. We also reviewed her medications today. she has been encouraged to call the  office with any questions or concerns that should arise related to todays visit.    No orders of the defined types were placed in this encounter.   Meds ordered this encounter  Medications   topiramate (TOPAMAX) 25 MG tablet    Sig: Take 1 tablet (25 mg total) by mouth daily.    Dispense:  30 tablet    Refill:  0    This patient was seen by Lynn Ito, PA-C in collaboration with Dr. Beverely Risen as a part of collaborative care agreement.   Total time spent:30 Minutes Time spent includes review of chart, medications, test results, and follow up plan with the patient.      Dr Lyndon Code Internal medicine

## 2022-07-17 ENCOUNTER — Ambulatory Visit (INDEPENDENT_AMBULATORY_CARE_PROVIDER_SITE_OTHER): Payer: Self-pay | Admitting: Dermatology

## 2022-07-17 DIAGNOSIS — L988 Other specified disorders of the skin and subcutaneous tissue: Secondary | ICD-10-CM

## 2022-07-17 NOTE — Progress Notes (Signed)
   Follow-Up Visit   Subjective  Hannah Stevens is a 31 y.o. female who presents for the following: Facial Elastosis (Patient here today for Botox. ).  The following portions of the chart were reviewed this encounter and updated as appropriate:   Tobacco  Allergies  Meds  Problems  Med Hx  Surg Hx  Fam Hx      Review of Systems:  No other skin or systemic complaints except as noted in HPI or Assessment and Plan.  Objective  Well appearing patient in no apparent distress; mood and affect are within normal limits.  A focused examination was performed including face. Relevant physical exam findings are noted in the Assessment and Plan.  face Rhytides and volume loss.                  Assessment & Plan  Elastosis of skin face  Botox Crows Feet - 7 units each side  Consider adding 8 units for lip flip at next appointment   Filling material injection - face Location: See attached image  Informed consent: Discussed risks (infection, pain, bleeding, bruising, swelling, allergic reaction, paralysis of nearby muscles, eyelid droop, double vision, neck weakness, difficulty breathing, headache, undesirable cosmetic result, and need for additional treatment) and benefits of the procedure, as well as the alternatives.  Informed consent was obtained.  Preparation: The area was cleansed with alcohol.  Procedure Details:  Botox was injected into the dermis with a 30-gauge needle. Pressure applied to any bleeding. Ice packs offered for swelling.  Lot Number:  [id number] Expiration:  09/2024  Total Units Injected:  14  Plan: Tylenol may be used for headache.  Allow 2 weeks before returning to clinic for additional dosing as needed. Patient will call for any problems.    Return if symptoms worsen or fail to improve.  Graciella Belton, RMA, am acting as scribe for Forest Gleason, MD .  Documentation: I have reviewed the above documentation for accuracy and  completeness, and I agree with the above.  Forest Gleason, MD

## 2022-07-17 NOTE — Patient Instructions (Addendum)
Arnica cream for bruising  Due to recent changes in healthcare laws, you may see results of your pathology and/or laboratory studies on MyChart before the doctors have had a chance to review them. We understand that in some cases there may be results that are confusing or concerning to you. Please understand that not all results are received at the same time and often the doctors may need to interpret multiple results in order to provide you with the best plan of care or course of treatment. Therefore, we ask that you please give [place] 2 business days to thoroughly review all your results before contacting the office for clarification. Should we see a critical lab result, you will be contacted sooner.   If You Need Anything After Your Visit  If you have any questions or concerns for your doctor, please call our main line at [phone] and press option 4 to reach your doctor's medical assistant. If no one answers, please leave a voicemail as directed and we will return your call as soon as possible. Messages left after 4 pm will be answered the following business day.   You may also send [place] a message via MyChart. We typically respond to MyChart messages within 1-2 business days.  For prescription refills, please ask your pharmacy to contact our office. Our fax number is [phone].  If you have an urgent issue when the clinic is closed that cannot wait until the next business day, you can page your doctor at the number below.    Please note that while we do our best to be available for urgent issues outside of office hours, we are not available 24/7.   If you have an urgent issue and are unable to reach [place], you may choose to seek medical care at your doctor's office, retail clinic, urgent care center, or emergency room.  If you have a medical emergency, please immediately call 911 or go to the emergency department.  Pager Numbers  - Dr. Gwen Pounds: [phone]  - Dr. Neale Burly: [phone]  -  Dr. Roseanne Reno: [phone]  In the event of inclement weather, please call our main line at [phone] for an update on the status of any delays or closures.  Dermatology Medication Tips: Please keep the boxes that topical medications come in in order to help keep track of the instructions about where and how to use these. Pharmacies typically print the medication instructions only on the boxes and not directly on the medication tubes.   If your medication is too expensive, please contact our office at [phone] option 4 or send [place] a message through MyChart.   We are unable to tell what your co-pay for medications will be in advance as this is different depending on your insurance coverage. However, we may be able to find a substitute medication at lower cost or fill out paperwork to get insurance to cover a needed medication.   If a prior authorization is required to get your medication covered by your insurance company, please allow [place] 1-2 business days to complete this process.  Drug prices often vary depending on where the prescription is filled and some pharmacies may offer cheaper prices.  The website www.goodrx.com contains coupons for medications through different pharmacies. The prices here do not account for what the cost may be with help from insurance (it may be cheaper with your insurance), but the website can give you the price if you did not use any insurance.  - You can print the associated  coupon and take it with your prescription to the pharmacy.  - You may also stop by our office during regular business hours and pick up a GoodRx coupon card.  - If you need your prescription sent electronically to a different pharmacy, notify our office through [facility] or by phone at [phone] option 4.     Si Usted Necesita Algo Despus de Su Visita  Tambin puede enviarnos un mensaje a travs de Pharmacist, community. Por lo general respondemos a los mensajes de MyChart en el  transcurso de 1 a 2 das hbiles.  Para renovar recetas, por favor pida a su farmacia que se ponga en contacto con nuestra oficina. Harland Dingwall de fax es [place] [phone].  Si tiene un asunto urgente cuando la clnica est cerrada y que no puede esperar hasta el siguiente da hbil, puede llamar/localizar a su doctor(a) al nmero que aparece a continuacin.   Por favor, tenga en cuenta que aunque hacemos todo lo posible para estar disponibles para asuntos urgentes fuera del horario de [place], no estamos disponibles las 24 horas del da, los 7 das de la [place].   Si tiene un problema urgente y no puede comunicarse con nosotros, puede optar por buscar atencin mdica  en el consultorio de su doctor(a), en una clnica privada, en un centro de atencin urgente o en una sala de emergencias.  Si tiene Engineering geologist, por favor llame inmediatamente al 911 o vaya a la sala de emergencias.  Nmeros de bper  - Dr. Nehemiah Massed: [phone]  - Dra. Moye: [phone]  - Dra. Nicole Kindred: [phone]  En caso de inclemencias del [place], por favor llame a Johnsie Kindred principal al [phone] para una actualizacin sobre el [place] de cualquier retraso o cierre.  Consejos para la medicacin en dermatologa: Por favor, guarde las cajas en las que vienen los medicamentos de uso tpico para ayudarle a seguir las instrucciones sobre dnde y cmo usarlos. Las farmacias generalmente imprimen las instrucciones del medicamento slo en las cajas y no directamente en los tubos del [place].   Si su medicamento es muy caro, por favor, pngase en contacto con Zigmund Daniel llamando al [phone] y presione la opcin 4 o envenos un mensaje a travs de Pharmacist, community.   No podemos decirle cul ser su copago por los medicamentos por adelantado ya que esto es diferente dependiendo de la cobertura de su seguro. Sin embargo, es posible que podamos encontrar un medicamento sustituto a Electrical engineer un  formulario para que el seguro cubra el medicamento que se considera necesario.   Si se requiere una autorizacin previa para que su compaa de seguros [place] su medicamento, por favor permtanos de 1 a 2 das hbiles para completar este proceso.  Los precios de los medicamentos varan con frecuencia dependiendo del Environmental consultant de dnde se surte la receta y alguna farmacias pueden ofrecer precios ms baratos.  El sitio web www.goodrx.com tiene cupones para medicamentos de Airline pilot. Los precios aqu no tienen en cuenta lo que podra costar con la ayuda del seguro (puede ser ms barato con su seguro), pero el sitio web puede darle el precio si no utiliz Research scientist (physical sciences).  - Puede imprimir el cupn correspondiente y llevarlo con su receta a la farmacia.  - Tambin puede pasar por nuestra oficina durante el horario de atencin regular y Charity fundraiser una tarjeta de cupones de GoodRx.  - Si necesita que su receta se enve electrnicamente a Chiropodist, informe a nuestra oficina a travs  o por telfono llamando al [phone] y presione la opcin 4.  

## 2022-07-25 ENCOUNTER — Encounter: Payer: Self-pay | Admitting: Certified Nurse Midwife

## 2022-07-25 ENCOUNTER — Encounter: Payer: Self-pay | Admitting: Obstetrics and Gynecology

## 2022-07-29 ENCOUNTER — Encounter: Payer: Self-pay | Admitting: Dermatology

## 2022-07-30 ENCOUNTER — Other Ambulatory Visit: Payer: Self-pay | Admitting: Physician Assistant

## 2022-08-02 ENCOUNTER — Ambulatory Visit: Payer: BC Managed Care – PPO | Admitting: Physician Assistant

## 2022-08-02 ENCOUNTER — Encounter: Payer: Self-pay | Admitting: Physician Assistant

## 2022-08-02 VITALS — BP 122/67 | HR 82 | Temp 98.4°F | Resp 16 | Ht 63.0 in | Wt 275.6 lb

## 2022-08-02 DIAGNOSIS — Z3200 Encounter for pregnancy test, result unknown: Secondary | ICD-10-CM | POA: Diagnosis not present

## 2022-08-02 DIAGNOSIS — Z6841 Body Mass Index (BMI) 40.0 and over, adult: Secondary | ICD-10-CM

## 2022-08-02 LAB — POCT URINE PREGNANCY: Preg Test, Ur: NEGATIVE

## 2022-08-02 MED ORDER — PHENTERMINE HCL 37.5 MG PO CAPS: 37.5000 mg | ORAL_CAPSULE | ORAL | 0 refills | Status: DC

## 2022-08-02 NOTE — Progress Notes (Signed)
[facility] [place], [place] [address]  Internal MEDICINE  Office Visit Note  Patient Name: Hannah Stevens  [id number]  [id number]  Date of Service: 08/08/2022  Chief Complaint  Patient presents with   Follow-up   Weight Loss    HPI Pt is here for routine follow up -Did try topamax for 2 weeks but it didn't seem to do anything so stopped -Has been working on diet and has still lost weight, 6 lbs since last visit.  -No longer producing any milk, but cycle still off, just spotting currently.  -still having cravings and would like something to help -Exercising as well -Has done phentermine previously and would like to restart.  -metabolic done in office  Current Medication: Outpatient Encounter Medications as of 08/02/2022  Medication Sig   citalopram (CELEXA) 10 MG tablet Take 1 tablet (10 mg total) by mouth daily.   phentermine 37.5 MG capsule Take 1 capsule (37.5 mg total) by mouth every morning.   [DISCONTINUED] topiramate (TOPAMAX) 25 MG tablet Take 1 tablet (25 mg total) by mouth daily.   No facility-administered encounter medications on file as of 08/02/2022.    Surgical History: Past Surgical History:  Procedure Laterality Date   TONSILLECTOMY     WISDOM TOOTH EXTRACTION      Medical History: Past Medical History:  Diagnosis Date   Anxiety    Ovarian cyst    Pelvic pain    Vaccine for human papilloma virus (HPV) types 6, 13, 31, and 18 administered     Family History: Family History  Problem Relation Age of Onset   Diabetes Father     Social History   Socioeconomic History   Marital status: Married    Spouse name: Not on file   Number of children: Not on file   Years of education: Not on file   Highest education level: Not on file  Occupational History   Not on file  Tobacco Use   Smoking status: Former    Years: 1.00    Types: Cigarettes    Quit date: 10/14/2014    Years since quitting: 7.8   Smokeless tobacco:  Never  Vaping Use   Vaping Use: Never used  Substance and Sexual Activity   Alcohol use: Not Currently    Alcohol/week: 0.0 standard drinks of alcohol    Comment: occasionally   Drug use: No   Sexual activity: Yes  Other Topics Concern   Not on file  Social History Narrative   Not on file   Social Determinants of Health   Financial Resource Strain: Not on file  Food Insecurity: Not on file  Transportation Needs: Not on file  Physical Activity: Not on file  Stress: Not on file  Social Connections: Not on file  Intimate Partner Violence: Not on file      Review of Systems  Constitutional:  Negative for chills, fatigue and unexpected weight change.  HENT:  Negative for congestion, postnasal drip, rhinorrhea, sneezing and sore throat.   Eyes:  Negative for redness.  Respiratory:  Negative for cough, chest tightness and shortness of breath.   Cardiovascular:  Negative for chest pain and palpitations.  Gastrointestinal:  Negative for abdominal pain, constipation, diarrhea, nausea and vomiting.  Genitourinary:  Negative for dysuria and frequency.  Musculoskeletal:  Negative for arthralgias, back pain, joint swelling and neck pain.  Skin:  Negative for rash.  Neurological: Negative.  Negative for tremors and numbness.  Hematological:  Negative for adenopathy.  Does not bruise/bleed easily.  Psychiatric/Behavioral:  Negative for behavioral problems (Depression), sleep disturbance and suicidal ideas.     Vital Signs: BP 122/67   Pulse 82   Temp 98.4 F (36.9 C)   Resp 16   Ht 5\' 3"  (1.6 m)   Wt 275 lb 9.6 oz (125 kg)   SpO2 98%   BMI 48.82 kg/m    Physical Exam Vitals and nursing note reviewed.  Constitutional:      General: She is not in acute distress.    Appearance: Normal appearance. She is well-developed. She is obese. She is not diaphoretic.  HENT:     Head: Normocephalic and atraumatic.     Mouth/Throat:     Pharynx: No oropharyngeal exudate.  Eyes:      Pupils: Pupils are equal, round, and reactive to light.  Neck:     Thyroid: No thyromegaly.     Vascular: No JVD.     Trachea: No tracheal deviation.  Cardiovascular:     Rate and Rhythm: Normal rate and regular rhythm.     Heart sounds: Normal heart sounds. No murmur heard.    No friction rub. No gallop.  Pulmonary:     Effort: Pulmonary effort is normal. No respiratory distress.     Breath sounds: No wheezing or rales.  Chest:     Chest wall: No tenderness.  Abdominal:     General: Bowel sounds are normal.     Palpations: Abdomen is soft.  Musculoskeletal:        General: Normal range of motion.     Cervical back: Normal range of motion and neck supple.  Lymphadenopathy:     Cervical: No cervical adenopathy.  Skin:    General: Skin is warm and dry.  Neurological:     Mental Status: She is alert and oriented to person, place, and time.     Cranial Nerves: No cranial nerve deficit.  Psychiatric:        Behavior: Behavior normal.        Thought Content: Thought content normal.        Judgment: Judgment normal.        Assessment/Plan: 1. Morbid obesity with BMI of 45.0-49.9, adult [facility]) May start on phentermine daily while working on diet and exercise - Metabolic Test - phentermine 37.5 MG capsule; Take 1 capsule (37.5 mg total) by mouth every morning.  Dispense: 30 capsule; Refill: 0 Obesity Counseling: Risk Assessment: An assessment of behavioral risk factors was made today and includes lack of exercise sedentary lifestyle, lack of portion control and poor dietary habits.  Risk Modification Advice: She was counseled on portion control guidelines. Restricting daily caloric intake to 1500. The detrimental long term effects of obesity on her health and ongoing poor compliance was also discussed with the patient.  2. Encounter for pregnancy test, result unknown - POCT urine pregnancy negative   General Counseling: [name] verbalizes understanding of the findings of todays  visit and agrees with plan of treatment. I have discussed any further diagnostic evaluation that may be needed or ordered today. We also reviewed her medications today. she has been encouraged to call the office with any questions or concerns that should arise related to todays visit.    Orders Placed This Encounter  Procedures   Metabolic Test   POCT urine pregnancy    Meds ordered this encounter  Medications   phentermine 37.5 MG capsule    Sig: Take 1 capsule (37.5 mg total) by mouth  every morning.    Dispense:  30 capsule    Refill:  0    This patient was seen by Lynn Ito, PA-C in collaboration with Dr. Beverely Risen as a part of collaborative care agreement.   Total time spent:35 Minutes Time spent includes review of chart, medications, test results, and follow up plan with the patient.      Dr Lyndon Code Internal medicine

## 2022-08-29 ENCOUNTER — Ambulatory Visit: Payer: BC Managed Care – PPO | Admitting: Physician Assistant

## 2022-09-02 ENCOUNTER — Ambulatory Visit: Payer: BC Managed Care – PPO | Admitting: Physician Assistant

## 2022-09-10 ENCOUNTER — Other Ambulatory Visit: Payer: Self-pay | Admitting: Nurse Practitioner

## 2022-09-10 MED ORDER — ONDANSETRON 4 MG PO TBDP: 4.0000 mg | ORAL_TABLET | Freq: Three times a day (TID) | ORAL | 0 refills | Status: DC | PRN

## 2022-10-14 NOTE — L&D Delivery Note (Signed)
Delivery Summary for Hannah Stevens  Labor Events:   Preterm labor: No data found  Rupture date: No data found  Rupture time: No data found  Rupture type: No data found  Fluid Color: No data found  Induction: No data found  Augmentation: No data found  Complications: No data found  Cervical ripening: No data found No data found   No data found     Delivery:   Episiotomy: No data found  Lacerations: No data found  Repair suture: No data found  Repair # of packets: No data found  Blood loss (ml): No data found   Information for the patient's newborn:  Angela, Johanson [[id number]]   Delivery 06/12/2023 11:07 AM by  C-Section, Low Transverse Sex:  female Gestational Age: [redacted]w[redacted]d Delivery Clinician:   Living?:         APGARS  One minute Five minutes Ten minutes  Skin color:        Heart rate:        Grimace:        Muscle tone:        Breathing:        Totals: 9  9      Presentation/position:      Resuscitation:   Cord information:    Disposition of cord blood:     Blood gases sent?  Complications:   Placenta: Delivered:       appearance Newborn Measurements: Weight: 7 lb 6.2 oz (3350 g)  Height: 20"  Head circumference:    Chest circumference:    Other providers:    Additional  information: Forceps:   Vacuum:   Breech:   Observed anomalies        See Dr. Oretha Milch operative note for details of C-section delivery.    Hildred Laser, MD [facility]

## 2022-10-15 ENCOUNTER — Ambulatory Visit: Payer: BC Managed Care – PPO | Admitting: Podiatry

## 2022-10-15 VITALS — BP 125/88 | HR 83

## 2022-10-15 DIAGNOSIS — L600 Ingrowing nail: Secondary | ICD-10-CM | POA: Diagnosis not present

## 2022-10-15 NOTE — Progress Notes (Signed)
  Subjective:  Patient ID: Hannah Stevens, female    DOB: [birth year],  MRN: [id number]  Chief Complaint  Patient presents with   Ingrown Toenail    Ingrown toenail, right hallux     32 y.o. female presents with the above complaint.  Patient presents with right hallux medial border ingrown pain for touch is progressive gotten worse worse with ambulation worse with pressure would like to have it removed.  She states the other sites are doing fine.   Review of Systems: Negative except as noted in the HPI. Denies N/V/F/Ch.  Past Medical History:  Diagnosis Date   Anxiety    Ovarian cyst    Pelvic pain    Vaccine for human papilloma virus (HPV) types 6, 11, 16, and 18 administered     Current Outpatient Medications:    ondansetron (ZOFRAN-ODT) 4 MG disintegrating tablet, Take 1 tablet (4 mg total) by mouth every 8 (eight) hours as needed for nausea or vomiting., Disp: 20 tablet, Rfl: 0   citalopram (CELEXA) 10 MG tablet, Take 1 tablet (10 mg total) by mouth daily., Disp: 90 tablet, Rfl: 2   phentermine 37.5 MG capsule, Take 1 capsule (37.5 mg total) by mouth every morning., Disp: 30 capsule, Rfl: 0  Social History   Tobacco Use  Smoking Status Former   Years: 1.00   Types: Cigarettes   Quit date: 10/14/2014   Years since quitting: 8.0  Smokeless Tobacco Never    No Known Allergies Objective:   Vitals:   10/15/22 1321  BP: 125/88  Pulse: 83   There is no height or weight on file to calculate BMI. Constitutional Well developed. Well nourished.  Vascular Dorsalis pedis pulses palpable bilaterally. Posterior tibial pulses palpable bilaterally. Capillary refill normal to all digits.  No cyanosis or clubbing noted. Pedal hair growth normal.  Neurologic Normal speech. Oriented to person, place, and time. Epicritic sensation to light touch grossly present bilaterally.  Dermatologic Painful ingrowing nail at medial nail borders of the hallux nail right. No other open  wounds. No skin lesions.  Orthopedic: Normal joint ROM without pain or crepitus bilaterally. No visible deformities. No bony tenderness.   Radiographs: None Assessment:  No diagnosis found. Plan:  Patient was evaluated and treated and all questions answered.  Ingrown Nail, right -Patient elects to proceed with minor surgery to remove ingrown toenail removal today. Consent reviewed and signed by patient. -Ingrown nail excised. See procedure note. -Educated on post-procedure care including soaking. Written instructions provided and reviewed. -Patient to follow up in 2 weeks for nail check.  Procedure: Excision of Ingrown Toenail Location: Right 1st toe medial nail borders. Anesthesia: Lidocaine 1% plain; 1.5 mL and Marcaine 0.5% plain; 1.5 mL, digital block. Skin Prep: Betadine. Dressing: Silvadene; telfa; dry, sterile, compression dressing. Technique: Following skin prep, the toe was exsanguinated and a tourniquet was secured at the base of the toe. The affected nail border was freed, split with a nail splitter, and excised. Chemical matrixectomy was then performed with phenol and irrigated out with alcohol. The tourniquet was then removed and sterile dressing applied. Disposition: Patient tolerated procedure well. Patient to return in 2 weeks for follow-up.   Return in about 2 weeks (around 10/29/2022).Right medial ingrown

## 2022-10-15 NOTE — Patient Instructions (Signed)

## 2022-10-30 ENCOUNTER — Ambulatory Visit (INDEPENDENT_AMBULATORY_CARE_PROVIDER_SITE_OTHER): Payer: BC Managed Care – PPO

## 2022-10-30 VITALS — BP 136/74 | HR 98 | Wt 282.8 lb

## 2022-10-30 DIAGNOSIS — Z3201 Encounter for pregnancy test, result positive: Secondary | ICD-10-CM

## 2022-10-30 DIAGNOSIS — N926 Irregular menstruation, unspecified: Secondary | ICD-10-CM

## 2022-10-30 LAB — POCT URINE PREGNANCY: Preg Test, Ur: POSITIVE — AB

## 2022-10-30 NOTE — Progress Notes (Signed)
Subjective:    Hannah Stevens is a 32 y.o. female who presents for evaluation of amenorrhea. She believes she could be pregnant. Pregnancy is desired.  Last period was normal.   Last LMP: 09/21/2022   Lab Review Urine HCG: positive       Plan:    Pregnancy Test:  Positive: EDC: 07/04/2023 GA: [redacted]w[redacted]d. Briefly discussed positive results and sent to check out for scheduling for New OB appointments.

## 2022-10-31 ENCOUNTER — Other Ambulatory Visit: Payer: Self-pay | Admitting: Podiatry

## 2022-10-31 ENCOUNTER — Telehealth: Payer: Self-pay | Admitting: Podiatry

## 2022-10-31 MED ORDER — DOXYCYCLINE HYCLATE 100 MG PO TABS: 100.0000 mg | ORAL_TABLET | Freq: Two times a day (BID) | ORAL | 0 refills | Status: DC

## 2022-10-31 MED ORDER — CEPHALEXIN 500 MG PO CAPS: 500.0000 mg | ORAL_CAPSULE | Freq: Three times a day (TID) | ORAL | 0 refills | Status: DC

## 2022-10-31 NOTE — Addendum Note (Signed)
Addended by: Boneta Lucks on: 10/31/2022 11:24 AM   Modules accepted: Orders

## 2022-10-31 NOTE — Telephone Encounter (Signed)
Pt called stating she thinks the toe that had the ingrown removed is infected. She stated it is red and inflamed with possible pus at the back area where ingrown was removed.   Hannah Stevens is to have Dr Posey Pronto call in an antibiotic(pt is pregnant) and pt is being seen tomorrow 1.19 in [place] at 8am. I did offer her to come in today to [place] but pt did not want to come to [facility] office.

## 2022-10-31 NOTE — Telephone Encounter (Signed)
Pt wanted to make sure you are aware she is pregnant so she is limited to what antibiotics she can take.

## 2022-11-01 ENCOUNTER — Ambulatory Visit (INDEPENDENT_AMBULATORY_CARE_PROVIDER_SITE_OTHER): Payer: BC Managed Care – PPO | Admitting: Podiatry

## 2022-11-01 VITALS — BP 122/78

## 2022-11-01 DIAGNOSIS — L600 Ingrowing nail: Secondary | ICD-10-CM

## 2022-11-01 NOTE — Progress Notes (Signed)
  Subjective:  Patient ID: Hannah Stevens, female    DOB: 11-05-1990,  MRN: [id number]  Chief Complaint  Patient presents with   Nail Problem    32 y.o. female presents with the above complaint.  Patient presents with right hallux medial border paronychia after undergoing a removal of the ingrown.  She states that she is pregnant and wanted to make sure that there is nothing concerning going on.  She is leaving out of town she denies any other acute complaints.  She would like to discuss treatment options for this.  She is currently taking antibiotics cephalexin.   Review of Systems: Negative except as noted in the HPI. Denies N/V/F/Ch.  Past Medical History:  Diagnosis Date   Anxiety    Ovarian cyst    Pelvic pain    Vaccine for human papilloma virus (HPV) types 6, 11, 16, and 18 administered     Current Outpatient Medications:    ondansetron (ZOFRAN-ODT) 4 MG disintegrating tablet, Take 1 tablet (4 mg total) by mouth every 8 (eight) hours as needed for nausea or vomiting., Disp: 20 tablet, Rfl: 0   cephALEXin (KEFLEX) 500 MG capsule, Take 1 capsule (500 mg total) by mouth 3 (three) times daily., Disp: 30 capsule, Rfl: 0   citalopram (CELEXA) 10 MG tablet, Take 1 tablet (10 mg total) by mouth daily., Disp: 90 tablet, Rfl: 2   doxycycline (VIBRA-TABS) 100 MG tablet, Take 1 tablet (100 mg total) by mouth 2 (two) times daily., Disp: 28 tablet, Rfl: 0   phentermine 37.5 MG capsule, Take 1 capsule (37.5 mg total) by mouth every morning., Disp: 30 capsule, Rfl: 0  Social History   Tobacco Use  Smoking Status Former   Years: 1.00   Types: Cigarettes   Quit date: 10/14/2014   Years since quitting: 8.0  Smokeless Tobacco Never    No Known Allergies Objective:   Vitals:   11/01/22 0806  BP: 122/78   There is no height or weight on file to calculate BMI. Constitutional Well developed. Well nourished.  Vascular Dorsalis pedis pulses palpable bilaterally. Posterior tibial pulses  palpable bilaterally. Capillary refill normal to all digits.  No cyanosis or clubbing noted. Pedal hair growth normal.  Neurologic Normal speech. Oriented to person, place, and time. Epicritic sensation to light touch grossly present bilaterally.  Dermatologic Right hallux medial border ingrown painful to touch mild paronychia noted around the ingrown.  No concern for recurrence of ingrown noted.  Pain on palpation.  Orthopedic: Normal joint ROM without pain or crepitus bilaterally. No visible deformities. No bony tenderness.   Radiographs: None Assessment:   1. Ingrowing right great toenail    Plan:  Patient was evaluated and treated and all questions answered.  Right hallux paronychia with history of removal of ingrown -All questions and concerns were discussed with the patient in extensive detail.  Given the paronychia patient will benefit from cephalexin cephalexin was sent to the pharmacy yesterday and has already started taking it. -If there is no resolve meant of asked her to come back and see me.  She states understanding.  No follow-ups on file.

## 2022-11-07 ENCOUNTER — Telehealth: Payer: Self-pay | Admitting: Podiatry

## 2022-11-07 NOTE — Telephone Encounter (Signed)
Patient is calling back because she thinks that her toe is still infected and hurting, please advise

## 2022-11-07 NOTE — Telephone Encounter (Signed)
Pt called stating she's still I pain and would like pain medication for foot however pt stated she's pregnant  Please advise

## 2022-11-08 ENCOUNTER — Telehealth: Payer: Self-pay | Admitting: Podiatry

## 2022-11-08 ENCOUNTER — Other Ambulatory Visit: Payer: Self-pay | Admitting: Podiatry

## 2022-11-08 NOTE — Progress Notes (Signed)
Spoke with patient via [employer] answering service.  Concern for worsening infection of the toe.  Patient states that she has enough Keflex through Sunday, 11/10/2022.  Continue the prescription until completed.  She has not been soaking her foot.  Recommend that she soaks her foot twice daily for 20 minutes and applies antibiotic ointment and a Band-Aid.  Patient will begin doing this.  If there is no improvement over the weekend recommend that she calls the office Monday morning for follow-up appointment with Dr. Posey Pronto.  Edrick Kins, DPM [facility]  Dr. Edrick Kins, DPM    [address], [place] [address]                Office [phone]  Fax [phone]

## 2022-11-08 NOTE — Telephone Encounter (Signed)
Pt calling back again today as she has not heard back from anyone after calling 2 times yesterday. Pt thinks toe where ingrown procedure was done is infected and  is painful. PT IS PREGNANT. She was prescribed an antibiotic last week and seen 1.19.2024 by Dr Posey Pronto. Is there anything she can get for the pain? Please call pt and let her know.

## 2022-11-19 ENCOUNTER — Ambulatory Visit: Payer: BC Managed Care – PPO | Admitting: Family

## 2022-11-22 ENCOUNTER — Ambulatory Visit (INDEPENDENT_AMBULATORY_CARE_PROVIDER_SITE_OTHER): Payer: BC Managed Care – PPO

## 2022-11-22 VITALS — Wt 280.0 lb

## 2022-11-22 DIAGNOSIS — Z369 Encounter for antenatal screening, unspecified: Secondary | ICD-10-CM

## 2022-11-22 DIAGNOSIS — Z3689 Encounter for other specified antenatal screening: Secondary | ICD-10-CM

## 2022-11-22 DIAGNOSIS — Z3480 Encounter for supervision of other normal pregnancy, unspecified trimester: Secondary | ICD-10-CM | POA: Insufficient documentation

## 2022-11-22 DIAGNOSIS — O0990 Supervision of high risk pregnancy, unspecified, unspecified trimester: Secondary | ICD-10-CM | POA: Insufficient documentation

## 2022-11-22 NOTE — Progress Notes (Signed)
We don't have the March schedule for [facility].

## 2022-11-22 NOTE — Progress Notes (Signed)
New OB Intake  I connected with  Hannah Stevens on 11/22/22 at  8:15 AM EST by telephone and verified that I am speaking with the correct person using two identifiers. Nurse is located at [employer] and pt is located at work.  I explained I am completing New OB Intake today. We discussed her EDD of 07/04/2023 that is based on LMP of 09/27/2022. Pt is G2/P1001. I reviewed her allergies, medications, Medical/Surgical/OB history, and appropriate screenings. There are cats in the home no.  Based on history, this is a/an pregnancy uncomplicated .   Patient Active Problem List   Diagnosis Date Noted   Supervision of other normal pregnancy, antepartum 11/22/2022   Genetic screening 12/15/2020   Type O blood, Rh positive 11/24/2020   History of COVID-19 10/16/2020   Acute upper respiratory infection 09/13/2020   Cough 09/13/2020   Dysuria 01/17/2020   External hemorrhoid 07/05/2019   Endometriosis 11/16/2018   Pelvic and perineal pain 11/16/2018   Severe right groin pain 11/15/2018   Generalized abdominal pain 10/19/2018   Other constipation 10/19/2018   Abnormal weight gain 03/12/2018   Moderate obesity 03/12/2018   Other fatigue 03/12/2018   Impaired fasting glucose 03/12/2018   Vitamin D deficiency 03/12/2018   Anxiety     Concerns addressed today Pt concerned about taking citalopram; adv to stay on it until she sees provider.  Pt states she did take it while breastfeeding; adv okay per old protocol  Delivery Plans:  Plans to deliver at [facility]. Would prefer Deneise Lever to deliver.   Anatomy US Explained first scheduled [place] will be scheduled soon; may be here or at [facility]; anatomy scan will be done at 20 weeks.  Labs Discussed genetic screening with patient. Patient desires genetic testing to be drawn with new OB. Discussed possible labs to be drawn at new OB appointment.  COVID Vaccine Patient has not had COVID vaccine.   Social Determinants of Health Food  Insecurity: denies food insecurity Transportation: Patient denies transportation needs. Childcare: Discussed no children allowed at ultrasound appointments.   First visit review I reviewed new OB appt with pt. I explained she will have ob bloodwork and pap smear/pelvic exam if indicated. Explained pt will be seen by Dr. Clovia Cuff at first visit; encounter routed to appropriate provider.   Cleophas Dunker, [facility] 11/22/2022  8:42 AM

## 2022-11-22 NOTE — Patient Instructions (Signed)
First Trimester of Pregnancy  The first trimester of pregnancy starts on the first day of your last menstrual period until the end of week 12. This is also called months 1 through 3 of pregnancy. Body changes during your first trimester Your body goes through many changes during pregnancy. The changes usually return to normal after your baby is born. Physical changes You may gain or lose weight. Your breasts may grow larger and hurt. The area around your nipples may get darker. Dark spots or blotches may develop on your face. You may have changes in your hair. Health changes You may feel like you might vomit (nauseous), and you may vomit. You may have heartburn. You may have headaches. You may have trouble pooping (constipation). Your gums may bleed. Other changes You may get tired easily. You may pee (urinate) more often. Your menstrual periods will stop. You may not feel hungry. You may want to eat certain kinds of food. You may have changes in your emotions from day to day. You may have more dreams. Follow these instructions at home: Medicines Take over-the-counter and prescription medicines only as told by your doctor. Some medicines are not safe during pregnancy. Take a prenatal vitamin that contains at least 600 micrograms (mcg) of folic acid. Eating and drinking Eat healthy meals that include: Fresh fruits and vegetables. Whole grains. Good sources of protein, such as meat, eggs, or tofu. Low-fat dairy products. Avoid raw meat and unpasteurized juice, milk, and cheese. If you feel like you may vomit, or you vomit: Eat 4 or 5 small meals a day instead of 3 large meals. Try eating a few soda crackers. Drink liquids between meals instead of during meals. You may need to take these actions to prevent or treat trouble pooping: Drink enough fluids to keep your pee (urine) pale yellow. Eat foods that are high in fiber. These include beans, whole grains, and fresh fruits and  vegetables. Limit foods that are high in fat and sugar. These include fried or sweet foods. Activity Exercise only as told by your doctor. Most people can do their usual exercise routine during pregnancy. Stop exercising if you have cramps or pain in your lower belly (abdomen) or low back. Do not exercise if it is too hot or too humid, or if you are in a place of great height (high altitude). Avoid heavy lifting. If you choose to, you may have sex unless your doctor tells you not to. Relieving pain and discomfort Wear a good support bra if your breasts are sore. Rest with your legs raised (elevated) if you have leg cramps or low back pain. If you have bulging veins (varicose veins) in your legs: Wear support hose as told by your doctor. Raise your feet for 15 minutes, 3-4 times a day. Limit salt in your food. Safety Wear your seat belt at all times when you are in a car. Talk with your doctor if someone is hurting you or yelling at you. Talk with your doctor if you are feeling sad or have thoughts of hurting yourself. Lifestyle Do not use hot tubs, steam rooms, or saunas. Do not douche. Do not use tampons or scented sanitary pads. Do not use herbal medicines, illegal drugs, or medicines that are not approved by your doctor. Do not drink alcohol. Do not smoke or use any products that contain nicotine or tobacco. If you need help quitting, ask your doctor. Avoid cat litter boxes and soil that is used by cats. These carry   germs that can cause harm to the baby and can cause a loss of your baby by miscarriage or stillbirth. General instructions Keep all follow-up visits. This is important. Ask for help if you need counseling or if you need help with nutrition. Your doctor can give you advice or tell you where to go for help. Visit your dentist. At home, brush your teeth with a soft toothbrush. Floss gently. Write down your questions. Take them to your prenatal visits. Where to find more  information American Pregnancy Association: americanpregnancy.org American College of Obstetricians and Gynecologists: www.acog.org Office on Women's Health: womenshealth.gov/pregnancy Contact a doctor if: You are dizzy. You have a fever. You have mild cramps or pressure in your lower belly. You have a nagging pain in your belly area. You continue to feel like you may vomit, you vomit, or you have watery poop (diarrhea) for 24 hours or longer. You have a bad-smelling fluid coming from your vagina. You have pain when you pee. You are exposed to a disease that spreads from person to person, such as chickenpox, measles, Zika virus, HIV, or hepatitis. Get help right away if: You have spotting or bleeding from your vagina. You have very bad belly cramping or pain. You have shortness of breath or chest pain. You have any kind of injury, such as from a fall or a car crash. You have new or increased pain, swelling, or redness in an arm or leg. Summary The first trimester of pregnancy starts on the first day of your last menstrual period until the end of week 12 (months 1 through 3). Eat 4 or 5 small meals a day instead of 3 large meals. Do not smoke or use any products that contain nicotine or tobacco. If you need help quitting, ask your doctor. Keep all follow-up visits. This information is not intended to replace advice given to you by your health care provider. Make sure you discuss any questions you have with your health care provider. Document Revised: 03/08/2020 Document Reviewed: 01/13/2020 Elsevier Patient Education  2023 Elsevier Inc. Commonly Asked Questions During Pregnancy  Cats: A parasite can be excreted in cat feces.  To avoid exposure you need to have another person empty the little box.  If you must empty the litter box you will need to wear gloves.  Wash your hands after handling your cat.  This parasite can also be found in raw or undercooked meat so this should also be  avoided.  Colds, Sore Throats, Flu: Please check your medication sheet to see what you can take for symptoms.  If your symptoms are unrelieved by these medications please call the office.  Dental Work: Most any dental work your dentist recommends is permitted.  X-rays should only be taken during the first trimester if absolutely necessary.  Your abdomen should be shielded with a lead apron during all x-rays.  Please notify your provider prior to receiving any x-rays.  Novocaine is fine; gas is not recommended.  If your dentist requires a note from us prior to dental work please call the office and we will provide one for you.  Exercise: Exercise is an important part of staying healthy during your pregnancy.  You may continue most exercises you were accustomed to prior to pregnancy.  Later in your pregnancy you will most likely notice you have difficulty with activities requiring balance like riding a bicycle.  It is important that you listen to your body and avoid activities that put you at a higher   risk of falling.  Adequate rest and staying well hydrated are a must!  If you have questions about the safety of specific activities ask your provider.    Exposure to Children with illness: Try to avoid obvious exposure; report any symptoms to us when noted,  If you have chicken pos, red measles or mumps, you should be immune to these diseases.   Please do not take any vaccines while pregnant unless you have checked with your OB provider.  Fetal Movement: After 28 weeks we recommend you do "kick counts" twice daily.  Lie or sit down in a calm quiet environment and count your baby movements "kicks".  You should feel your baby at least 10 times per hour.  If you have not felt 10 kicks within the first hour get up, walk around and have something sweet to eat or drink then repeat for an additional hour.  If count remains less than 10 per hour notify your provider.  Fumigating: Follow your pest control agent's  advice as to how long to stay out of your home.  Ventilate the area well before re-entering.  Hemorrhoids:   Most over-the-counter preparations can be used during pregnancy.  Check your medication to see what is safe to use.  It is important to use a stool softener or fiber in your diet and to drink lots of liquids.  If hemorrhoids seem to be getting worse please call the office.   Hot Tubs:  Hot tubs Jacuzzis and saunas are not recommended while pregnant.  These increase your internal body temperature and should be avoided.  Intercourse:  Sexual intercourse is safe during pregnancy as long as you are comfortable, unless otherwise advised by your provider.  Spotting may occur after intercourse; report any bright red bleeding that is heavier than spotting.  Labor:  If you know that you are in labor, please go to the hospital.  If you are unsure, please call the office and let us help you decide what to do.  Lifting, straining, etc:  If your job requires heavy lifting or straining please check with your provider for any limitations.  Generally, you should not lift items heavier than that you can lift simply with your hands and arms (no back muscles)  Painting:  Paint fumes do not harm your pregnancy, but may make you ill and should be avoided if possible.  Latex or water based paints have less odor than oils.  Use adequate ventilation while painting.  Permanents & Hair Color:  Chemicals in hair dyes are not recommended as they cause increase hair dryness which can increase hair loss during pregnancy.  " Highlighting" and permanents are allowed.  Dye may be absorbed differently and permanents may not hold as well during pregnancy.  Sunbathing:  Use a sunscreen, as skin burns easily during pregnancy.  Drink plenty of fluids; avoid over heating.  Tanning Beds:  Because their possible side effects are still unknown, tanning beds are not recommended.  Ultrasound Scans:  Routine ultrasounds are performed  at approximately 20 weeks.  You will be able to see your baby's general anatomy an if you would like to know the gender this can usually be determined as well.  If it is questionable when you conceived you may also receive an ultrasound early in your pregnancy for dating purposes.  Otherwise ultrasound exams are not routinely performed unless there is a medical necessity.  Although you can request a scan we ask that you pay for it when   conducted because insurance does not cover " patient request" scans.  Work: If your pregnancy proceeds without complications you may work until your due date, unless your physician or employer advises otherwise.  Round Ligament Pain/Pelvic Discomfort:  Sharp, shooting pains not associated with bleeding are fairly common, usually occurring in the second trimester of pregnancy.  They tend to be worse when standing up or when you remain standing for long periods of time.  These are the result of pressure of certain pelvic ligaments called "round ligaments".  Rest, Tylenol and heat seem to be the most effective relief.  As the womb and fetus grow, they rise out of the pelvis and the discomfort improves.  Please notify the office if your pain seems different than that described.  It may represent a more serious condition.  Common Medications Safe in Pregnancy  Acne:      Constipation:  Benzoyl Peroxide     Colace  Clindamycin      Dulcolax Suppository  Topica Erythromycin     Fibercon  Salicylic Acid      Metamucil         Miralax AVOID:        Senakot   Accutane    Cough:  Retin-A       Cough Drops  Tetracycline      Phenergan w/ Codeine if Rx  Minocycline      Robitussin (Plain & DM)  Antibiotics:     Crabs/Lice:  Ceclor       RID  Cephalosporins    AVOID:  E-Mycins      Kwell  Keflex  Macrobid/Macrodantin   Diarrhea:  Penicillin      Kao-Pectate  Zithromax      Imodium AD         PUSH FLUIDS AVOID:       Cipro     Fever:  Tetracycline      Tylenol (Regular  or Extra  Minocycline       Strength)  Levaquin      Extra Strength-Do not          Exceed 8 tabs/24 hrs Caffeine:        <200mg/day (equiv. To 1 cup of coffee or  approx. 3 12 oz sodas)         Gas: Cold/Hayfever:       Gas-X  Benadryl      Mylicon  Claritin       Phazyme  **Claritin-D        Chlor-Trimeton    Headaches:  Dimetapp      ASA-Free Excedrin  Drixoral-Non-Drowsy     Cold Compress  Mucinex (Guaifenasin)     Tylenol (Regular or Extra  Sudafed/Sudafed-12 Hour     Strength)  **Sudafed PE Pseudoephedrine   Tylenol Cold & Sinus     Vicks Vapor Rub  Zyrtec  **AVOID if Problems With Blood Pressure         Heartburn: Avoid lying down for at least 1 hour after meals  Aciphex      Maalox     Rash:  Milk of Magnesia     Benadryl    Mylanta       1% Hydrocortisone Cream  Pepcid  Pepcid Complete   Sleep Aids:  Prevacid      Ambien   Prilosec       Benadryl  Rolaids       Chamomile Tea  Tums (Limit 4/day)     Unisom           Tylenol PM         Warm milk-add vanilla or  Hemorrhoids:       Sugar for taste  Anusol/Anusol H.C.  (RX: Analapram 2.5%)  Sugar Substitutes:  Hydrocortisone OTC     Ok in moderation  Preparation H      Tucks        Vaseline lotion applied to tissue with wiping    Herpes:     Throat:  Acyclovir      Oragel  Famvir  Valtrex     Vaccines:         Flu Shot Leg Cramps:       *Gardasil  Benadryl      Hepatitis A         Hepatitis B Nasal Spray:       Pneumovax  Saline Nasal Spray     Polio Booster         Tetanus Nausea:       Tuberculosis test or PPD  Vitamin B6 25 mg TID   AVOID:    Dramamine      *Gardasil  Emetrol       Live Poliovirus  Ginger Root 250 mg QID    MMR (measles, mumps &  High Complex Carbs @ Bedtime    rebella)  Sea Bands-Accupressure    Varicella (Chickenpox)  Unisom 1/2 tab TID     *No known complications           If received before Pain:         Known pregnancy;   Darvocet       Resume series  after  Lortab        Delivery  Percocet    Yeast:   Tramadol      Femstat  Tylenol 3      Gyne-lotrimin  Ultram       Monistat  Vicodin           MISC:         All Sunscreens           Hair Coloring/highlights          Insect Repellant's          (Including DEET)         Mystic Tans  

## 2022-12-06 ENCOUNTER — Other Ambulatory Visit: Payer: Self-pay | Admitting: Certified Nurse Midwife

## 2022-12-09 ENCOUNTER — Ambulatory Visit
Admission: RE | Admit: 2022-12-09 | Discharge: 2022-12-09 | Disposition: A | Discharge status: Home / Self Care | Payer: BC Managed Care – PPO | Source: Ambulatory Visit | Attending: Licensed Practical Nurse | Admitting: Licensed Practical Nurse

## 2022-12-09 ENCOUNTER — Other Ambulatory Visit: Payer: Self-pay | Admitting: Licensed Practical Nurse

## 2022-12-09 ENCOUNTER — Telehealth: Payer: Self-pay | Admitting: Licensed Practical Nurse

## 2022-12-09 DIAGNOSIS — O4690 Antepartum hemorrhage, unspecified, unspecified trimester: Secondary | ICD-10-CM | POA: Diagnosis not present

## 2022-12-09 DIAGNOSIS — Z3A11 11 weeks gestation of pregnancy: Secondary | ICD-10-CM | POA: Diagnosis not present

## 2022-12-09 DIAGNOSIS — O208 Other hemorrhage in early pregnancy: Secondary | ICD-10-CM | POA: Diagnosis not present

## 2022-12-09 NOTE — Progress Notes (Signed)
Pt about [redacted]wks pregnant, Pt has had 3 episodes of seeing pink-red tinges discharge when wiping, ultrasound ordered.  Roberto Scales, [facility]  12/09/22  8:16 AM

## 2022-12-09 NOTE — Addendum Note (Signed)
Addended by: Roberto Scales on: 12/09/2022 08:17 AM   Modules accepted: Orders

## 2022-12-09 NOTE — Telephone Encounter (Signed)
TC to [place], Reviewed [place] 11wk3 days EDD 06/27/23. (This is 7 days different from LMP, will keep EDD 7/20) A small subchorionic hemorrhage was noted, reviewed the significance of [facility].  Roberto Scales, [facility]  12/06/2022 3:03 PM

## 2022-12-10 ENCOUNTER — Telehealth: Payer: Self-pay | Admitting: Physician Assistant

## 2022-12-10 NOTE — Telephone Encounter (Signed)
S/w pt briefly on 02/15 to sch appt for pt but pt stated at the time of call they were busy and that she would call back, called today to sch appt but phone went straight to voicemail; lvm to call the office -nm

## 2022-12-11 ENCOUNTER — Other Ambulatory Visit: Payer: BC Managed Care – PPO

## 2022-12-13 ENCOUNTER — Other Ambulatory Visit: Payer: BC Managed Care – PPO

## 2022-12-13 DIAGNOSIS — Z369 Encounter for antenatal screening, unspecified: Secondary | ICD-10-CM

## 2022-12-13 DIAGNOSIS — Z3480 Encounter for supervision of other normal pregnancy, unspecified trimester: Secondary | ICD-10-CM | POA: Diagnosis not present

## 2022-12-14 LAB — CBC/D/PLT+RPR+RH+ABO+RUBIGG...
Antibody Screen: NEGATIVE
Basophils Absolute: 0 10*3/uL (ref 0.0–0.2)
Basos: 0 %
EOS (ABSOLUTE): 0.1 10*3/uL (ref 0.0–0.4)
Eos: 1 %
HCV Ab: NONREACTIVE
HIV Screen 4th Generation wRfx: NONREACTIVE
Hematocrit: 41.9 % (ref 34.0–46.6)
Hemoglobin: 13.8 g/dL (ref 11.1–15.9)
Hepatitis B Surface Ag: NEGATIVE
Immature Grans (Abs): 0 10*3/uL (ref 0.0–0.1)
Immature Granulocytes: 0 %
Lymphocytes Absolute: 2 10*3/uL (ref 0.7–3.1)
Lymphs: 28 %
MCH: 28.5 pg (ref 26.6–33.0)
MCHC: 32.9 g/dL (ref 31.5–35.7)
MCV: 87 fL (ref 79–97)
Monocytes Absolute: 0.4 10*3/uL (ref 0.1–0.9)
Monocytes: 6 %
Neutrophils Absolute: 4.5 10*3/uL (ref 1.4–7.0)
Neutrophils: 65 %
Platelets: 302 10*3/uL (ref 150–450)
RBC: 4.84 x10E6/uL (ref 3.77–5.28)
RDW: 12.8 % (ref 11.7–15.4)
RPR Ser Ql: NONREACTIVE
Rh Factor: POSITIVE
Rubella Antibodies, IGG: 1.76 index (ref >0.99)
Varicella zoster IgG: 313 index (ref >165)
WBC: 7 10*3/uL (ref 3.4–10.8)

## 2022-12-14 LAB — HCV INTERPRETATION

## 2022-12-19 LAB — MATERNIT 21 PLUS CORE, BLOOD
Fetal Fraction: 5
Result (T21): NEGATIVE
Trisomy 13 (Patau syndrome): NEGATIVE
Trisomy 18 (Edwards syndrome): NEGATIVE
Trisomy 21 (Down syndrome): NEGATIVE

## 2022-12-20 ENCOUNTER — Ambulatory Visit (INDEPENDENT_AMBULATORY_CARE_PROVIDER_SITE_OTHER): Payer: BC Managed Care – PPO | Admitting: Obstetrics & Gynecology

## 2022-12-20 ENCOUNTER — Telehealth: Payer: Self-pay

## 2022-12-20 ENCOUNTER — Other Ambulatory Visit (HOSPITAL_COMMUNITY)
Admission: RE | Admit: 2022-12-20 | Discharge: 2022-12-20 | Disposition: A | Discharge status: Home / Self Care | Payer: BC Managed Care – PPO | Source: Ambulatory Visit | Attending: Obstetrics & Gynecology | Admitting: Obstetrics & Gynecology

## 2022-12-20 VITALS — BP 131/81 | HR 98 | Wt 276.0 lb

## 2022-12-20 DIAGNOSIS — Z369 Encounter for antenatal screening, unspecified: Secondary | ICD-10-CM | POA: Insufficient documentation

## 2022-12-20 DIAGNOSIS — Z3491 Encounter for supervision of normal pregnancy, unspecified, first trimester: Secondary | ICD-10-CM

## 2022-12-20 DIAGNOSIS — O99213 Obesity complicating pregnancy, third trimester: Secondary | ICD-10-CM | POA: Insufficient documentation

## 2022-12-20 DIAGNOSIS — Z3480 Encounter for supervision of other normal pregnancy, unspecified trimester: Secondary | ICD-10-CM | POA: Insufficient documentation

## 2022-12-20 DIAGNOSIS — O99210 Obesity complicating pregnancy, unspecified trimester: Secondary | ICD-10-CM | POA: Diagnosis not present

## 2022-12-20 DIAGNOSIS — Z3481 Encounter for supervision of other normal pregnancy, first trimester: Secondary | ICD-10-CM | POA: Diagnosis not present

## 2022-12-20 DIAGNOSIS — Z3A12 12 weeks gestation of pregnancy: Secondary | ICD-10-CM | POA: Diagnosis not present

## 2022-12-20 MED ORDER — CITALOPRAM HYDROBROMIDE 10 MG PO TABS: 10.0000 mg | ORAL_TABLET | Freq: Every day | ORAL | 2 refills | Status: DC

## 2022-12-20 NOTE — Progress Notes (Signed)
Subjective:    Hannah Stevens is a married  G55P1001(32 yo daughter) at  46w0dbeing seen today for her first obstetrical visit.  Her obstetrical history is significant for obesity. Patient does intend to breast feed. Pregnancy history fully reviewed.  Patient reports no complaints.  Vitals:   12/20/22 0955  BP: 131/81  Pulse: 98  Weight: 276 lb (125.2 kg)    HISTORY: OB History  Gravida Para Term Preterm AB Living  '2 1 1 '$ 0 0 1  SAB IAB Ectopic Multiple Live Births  0 0 0 0 1    # Outcome Date GA Lbr Len/2nd Weight Sex Delivery Anes PTL Lv  2 Current           1 Term 06/26/21 [id number] 6 lb 13.7 oz (3.11 kg) F Vag-Vacuum EPI  LIV    Obstetric Comments  1st Menstrual Cycle: 9   1st Pregnancy: N/A   Past Medical History:  Diagnosis Date   Anxiety    Ovarian cyst    Pelvic pain    Vaccine for human papilloma virus (HPV) types 6, 11, 16, and 18 administered    Past Surgical History:  Procedure Laterality Date   TONSILLECTOMY     WISDOM TOOTH EXTRACTION     Family History  Problem Relation Age of Onset   Healthy Mother    Diabetes Father    Healthy Brother    Healthy Brother    Healthy Brother    Healthy Maternal Grandmother    Healthy Maternal Grandfather    Healthy Paternal Grandmother    Cirrhosis Paternal Grandfather        liver     Exam                                      System:     Skin: normal coloration and turgor, no rashes    Neurologic: oriented   Extremities: normal strength, tone, and muscle mass   HEENT PERRLA   Mouth/Teeth mucous membranes moist, pharynx normal without lesions   Neck supple   Cardiovascular: regular rate and rhythm   Respiratory:  appears well, vitals normal, no respiratory distress, acyanotic, normal RR, ear and throat exam is normal, neck free of mass or lymphadenopathy, chest clear, no wheezing, crepitations, rhonchi, normal symmetric air entry   Abdomen: soft, non-tender; bowel sounds normal; no masses,   no organomegaly       Bedside ultrasound shows a moving singleton with FHR 160s   Assessment:    Pregnancy: G2P1001 Patient Active Problem List   Diagnosis Date Noted   Obesity in pregnancy 12/20/2022   Supervision of other normal pregnancy, antepartum 11/22/2022   Genetic screening 12/15/2020   Acute upper respiratory infection 09/13/2020   Dysuria 01/17/2020   External hemorrhoid 07/05/2019   Endometriosis 11/16/2018   Generalized abdominal pain 10/19/2018   Other constipation 10/19/2018   Abnormal weight gain 03/12/2018   Other fatigue 03/12/2018   Impaired fasting glucose 03/12/2018   Vitamin D deficiency 03/12/2018   Anxiety         Plan:     Initial labs drawn. Prenatal vitamins. Problem list reviewed and updated. Mat 21 low risk  Ultrasound discussed; fetal survey: ordered with MFM for anatomy at 19 weeks We discussed the rec'd weight gain of less than 15-20 pounds due to increased risk of still birth. Early A1C and 1 hour glucola Start  baby asa now Baseline pre E labs drawn  Follow up in 4 weeks.   Hannah Stevens 12/20/2022

## 2022-12-20 NOTE — Telephone Encounter (Signed)
Pt requesting a refill on her Celexa, she only has 3 pills left.

## 2022-12-20 NOTE — Addendum Note (Signed)
Addended by: [name] on: 12/20/2022 01:23 PM   Modules accepted: Orders

## 2022-12-21 LAB — URINALYSIS, ROUTINE W REFLEX MICROSCOPIC
Bilirubin, UA: NEGATIVE
Glucose, UA: NEGATIVE
Nitrite, UA: NEGATIVE
RBC, UA: NEGATIVE
Specific Gravity, UA: 1.029 (ref 1.005–1.030)
Urobilinogen, Ur: 0.2 mg/dL (ref 0.2–1.0)
pH, UA: 5.5 (ref 5.0–7.5)

## 2022-12-21 LAB — MICROSCOPIC EXAMINATION
Casts: NONE SEEN /lpf
Epithelial Cells (non renal): >10 /hpf — AB (ref 0–10)
WBC, UA: >30 /hpf — AB (ref 0–5)

## 2022-12-21 LAB — COMPREHENSIVE METABOLIC PANEL
ALT: 14 IU/L (ref 0–32)
AST: 13 IU/L (ref 0–40)
Albumin/Globulin Ratio: 1.6 (ref 1.2–2.2)
Albumin: 4.2 g/dL (ref 3.9–4.9)
Alkaline Phosphatase: 99 IU/L (ref 44–121)
BUN/Creatinine Ratio: 10 (ref 9–23)
BUN: 6 mg/dL (ref 6–20)
Bilirubin Total: 0.2 mg/dL (ref 0.0–1.2)
CO2: 21 mmol/L (ref 20–29)
Calcium: 9.1 mg/dL (ref 8.7–10.2)
Chloride: 99 mmol/L (ref 96–106)
Creatinine, Ser: 0.63 mg/dL (ref 0.57–1.00)
Globulin, Total: 2.6 g/dL (ref 1.5–4.5)
Glucose: 91 mg/dL (ref 70–99)
Potassium: 3.7 mmol/L (ref 3.5–5.2)
Sodium: 136 mmol/L (ref 134–144)
Total Protein: 6.8 g/dL (ref 6.0–8.5)
eGFR: 122 mL/min/{1.73_m2} (ref >59)

## 2022-12-21 LAB — HEMOGLOBIN A1C
Est. average glucose Bld gHb Est-mCnc: 105 mg/dL
Hgb A1c MFr Bld: 5.3 % (ref 4.8–5.6)

## 2022-12-23 LAB — MONITOR DRUG PROFILE 14(MW)
Amphetamine Scrn, Ur: NEGATIVE ng/mL
BARBITURATE SCREEN URINE: NEGATIVE ng/mL
BENZODIAZEPINE SCREEN, URINE: NEGATIVE ng/mL
Buprenorphine, Urine: NEGATIVE ng/mL
CANNABINOIDS UR QL SCN: NEGATIVE ng/mL
Cocaine (Metab) Scrn, Ur: NEGATIVE ng/mL
Creatinine(Crt), U: 212.1 mg/dL (ref 20.0–300.0)
Fentanyl, Urine: NEGATIVE pg/mL
Meperidine Screen, Urine: NEGATIVE ng/mL
Methadone Screen, Urine: NEGATIVE ng/mL
OXYCODONE+OXYMORPHONE UR QL SCN: NEGATIVE ng/mL
Opiate Scrn, Ur: NEGATIVE ng/mL
Ph of Urine: 5.4 (ref 4.5–8.9)
Phencyclidine Qn, Ur: NEGATIVE ng/mL
Propoxyphene Scrn, Ur: NEGATIVE ng/mL
SPECIFIC GRAVITY: 1.02
Tramadol Screen, Urine: NEGATIVE ng/mL

## 2022-12-23 LAB — URINE CYTOLOGY ANCILLARY ONLY
Chlamydia: NEGATIVE
Comment: NEGATIVE
Comment: NORMAL
Neisseria Gonorrhea: NEGATIVE

## 2022-12-23 LAB — NICOTINE SCREEN, URINE: Cotinine Ql Scrn, Ur: NEGATIVE ng/mL

## 2022-12-23 LAB — URINE CULTURE, OB REFLEX

## 2022-12-23 LAB — CULTURE, OB URINE

## 2023-01-16 ENCOUNTER — Other Ambulatory Visit: Payer: Self-pay

## 2023-01-16 DIAGNOSIS — Z131 Encounter for screening for diabetes mellitus: Secondary | ICD-10-CM

## 2023-01-17 ENCOUNTER — Other Ambulatory Visit: Payer: BC Managed Care – PPO

## 2023-01-17 ENCOUNTER — Encounter: Payer: Self-pay | Admitting: Obstetrics and Gynecology

## 2023-01-17 ENCOUNTER — Ambulatory Visit (INDEPENDENT_AMBULATORY_CARE_PROVIDER_SITE_OTHER): Payer: BC Managed Care – PPO | Admitting: Obstetrics and Gynecology

## 2023-01-17 VITALS — BP 128/76 | HR 79 | Wt 276.1 lb

## 2023-01-17 DIAGNOSIS — Z131 Encounter for screening for diabetes mellitus: Secondary | ICD-10-CM

## 2023-01-17 DIAGNOSIS — Z3A17 17 weeks gestation of pregnancy: Secondary | ICD-10-CM | POA: Diagnosis not present

## 2023-01-17 DIAGNOSIS — Z3689 Encounter for other specified antenatal screening: Secondary | ICD-10-CM

## 2023-01-17 DIAGNOSIS — Z1379 Encounter for other screening for genetic and chromosomal anomalies: Secondary | ICD-10-CM | POA: Diagnosis not present

## 2023-01-17 DIAGNOSIS — Z3482 Encounter for supervision of other normal pregnancy, second trimester: Secondary | ICD-10-CM

## 2023-01-17 LAB — POCT URINALYSIS DIPSTICK OB
Bilirubin, UA: NEGATIVE
Blood, UA: NEGATIVE
Glucose, UA: NEGATIVE
Ketones, UA: NEGATIVE
Leukocytes, UA: NEGATIVE
Nitrite, UA: NEGATIVE
POC,PROTEIN,UA: NEGATIVE
Spec Grav, UA: 1.01 (ref 1.010–1.025)
Urobilinogen, UA: 0.2 E.U./dL
pH, UA: 6.5 (ref 5.0–8.0)

## 2023-01-17 NOTE — Progress Notes (Signed)
ROB. Patient states doing well this pregnancy, she believes she might have felt 2 baby movements but nothing consistent. Early 1 hour glucose and AFP today. She states no questions or concerns at this time. OB Anes Consult 5/1 at 10 am.

## 2023-01-17 NOTE — Progress Notes (Signed)
ROB: She has no complaints.  She says she barely feels pregnant this pregnancy.  aFP today.  Early 1 hour GCT today.  Anatomy ultrasound scheduled anesthesia appointment scheduled for BMI.  Patient encouraged to take 2 baby aspirin daily.  Mild left-sided sciatica.

## 2023-01-18 LAB — GLUCOSE TOLERANCE, 1 HOUR: Glucose, 1Hr PP: 121 mg/dL (ref 70–199)

## 2023-01-19 LAB — AFP, SERUM, OPEN SPINA BIFIDA
AFP MoM: 0.55
AFP Value: 15 ng/mL
Gest. Age on Collection Date: 17 weeks
Maternal Age At EDD: 32 yr
OSBR Risk 1 IN: 10000
Test Results:: NEGATIVE
Weight: 276 [lb_av]

## 2023-02-12 ENCOUNTER — Ambulatory Visit (INDEPENDENT_AMBULATORY_CARE_PROVIDER_SITE_OTHER): Payer: BC Managed Care – PPO

## 2023-02-12 ENCOUNTER — Ambulatory Visit (INDEPENDENT_AMBULATORY_CARE_PROVIDER_SITE_OTHER): Payer: BC Managed Care – PPO | Admitting: Certified Nurse Midwife

## 2023-02-12 ENCOUNTER — Encounter
Admission: RE | Admit: 2023-02-12 | Discharge: 2023-02-12 | Disposition: A | Discharge status: Home / Self Care | Payer: BC Managed Care – PPO | Source: Ambulatory Visit | Attending: Anesthesiology | Admitting: Anesthesiology

## 2023-02-12 VITALS — BP 122/80 | HR 80 | Wt 284.0 lb

## 2023-02-12 DIAGNOSIS — E669 Obesity, unspecified: Secondary | ICD-10-CM

## 2023-02-12 DIAGNOSIS — Z3A17 17 weeks gestation of pregnancy: Secondary | ICD-10-CM | POA: Diagnosis not present

## 2023-02-12 DIAGNOSIS — Z3689 Encounter for other specified antenatal screening: Secondary | ICD-10-CM

## 2023-02-12 DIAGNOSIS — Z3482 Encounter for supervision of other normal pregnancy, second trimester: Secondary | ICD-10-CM

## 2023-02-12 DIAGNOSIS — Z362 Encounter for other antenatal screening follow-up: Secondary | ICD-10-CM

## 2023-02-12 DIAGNOSIS — Z3A20 20 weeks gestation of pregnancy: Secondary | ICD-10-CM

## 2023-02-12 DIAGNOSIS — O99210 Obesity complicating pregnancy, unspecified trimester: Secondary | ICD-10-CM

## 2023-02-12 DIAGNOSIS — O99212 Obesity complicating pregnancy, second trimester: Secondary | ICD-10-CM

## 2023-02-12 NOTE — Addendum Note (Signed)
Addended by: Kathlene Cote on: 02/12/2023 03:43 PM   Modules accepted: Orders

## 2023-02-12 NOTE — Progress Notes (Signed)
Body mass index is 50.31 kg/m. ROB doing well, feeling good movement. OB anesthesia consult today. Note not available at this time. Discussed MFM consult. Orders placed. U/s today for anatomy. Results note available at this time. Pt notes pain right leg near her knee. State she had it with her first pregnancy but after she delivered it resolved. She denies any symptoms of blood clot. Discussed due to location mostly likely musculoskeletal. Red flag symptoms reviewed with pt. Follow up for ROB in 4 wks.   Doreene Burke, CNM

## 2023-02-12 NOTE — Consult Note (Signed)
Anesthesiology consult note ( Ambulatory referral to OB anesthesia for morbid obesity) :  I had the distinct pleasure of meeting Hannah Stevens  today for an OB anesthesia precheck.  She has a history of morbid obesity with a BMI today of 49.  She is [redacted] weeks pregnant.  Patient reports having a poorly functioning epidural previously that needed three bolus's from anesthesiologist. She complains of chronic back pain with sciatica in bilateral legs. Her MP score is 2.  We discussed the risks of Obesity in Pregnancy including but not limited to: Patients with a BMI > 40 are at increased risk for complications during pregnancy, such as hypertensive disorders of pregnancy, gestational diabetes, obstructive sleep apnea, thromboembolic disease, prolonged labor, operative vaginal delivery and need for cesarean delivery. There is an increased risk of airway complications, including inability to place a breathing tube, should an emergency cesarean delivery be required. There is an increased risk of aspiration, where stomach contents get into the lungs and can cause inflammation, infection and even respiratory failure. Epidural placement is more difficult in obesity, often requires multiple attempts, more often results in inadequate pain relief, more often requires replacement due to movement of the epidural catheter.  I explained to the patient that we would follow our Obesity in Pregnancy Protocol: An anesthesiology consultation is recommended for all patients with a pre-pregnancy BMI ? 45. During this consultation the anesthesiologist will ask you questions about your medical history as well as do a physical exam that specifically looks for features that are predictive of complications of anesthesia.  The anesthesiologist will also review with you potential complications of anesthesia that are specifically increased due to obesity during pregnancy.  If your BMI ? 49 at your 34 week appointment you will have to be  evaluated by an anesthesiologist prior to 36 weeks. This evaluation will specifically determine whether you are at high risk for complications of anesthesia. If you are found to be at high risk for complications of anesthesia your OB provider will be directed to transfer your care to an OB provider at a hospital that has a higher maternal level of care designation  Patient voiced understanding acknowledging that we had discussed her pathway forward.  Nelta Numbers MD

## 2023-02-12 NOTE — Addendum Note (Signed)
Addended by: Kathlene Cote on: 02/12/2023 03:49 PM   Modules accepted: Orders

## 2023-02-21 ENCOUNTER — Encounter: Payer: Self-pay | Admitting: Podiatry

## 2023-02-21 ENCOUNTER — Ambulatory Visit: Payer: BC Managed Care – PPO | Admitting: Podiatry

## 2023-02-21 VITALS — BP 130/69 | HR 81

## 2023-02-21 DIAGNOSIS — L600 Ingrowing nail: Secondary | ICD-10-CM | POA: Diagnosis not present

## 2023-02-21 NOTE — Progress Notes (Signed)
   Chief Complaint  Patient presents with   Nail Problem    "It's ingrown again.  I have had it fixed several times.  I'm pregnant."    Subjective: Patient presents today for evaluation of pain to the medial border right great toe. Patient is concerned for possible ingrown nail.  It is very sensitive to touch.  Patient has history of ingrown toenails to this very same border and has come to the office previously.  Last visit 11/01/2022 phenol was applied and she states that she has had pain and tenderness ever since and it has not resolved completely.  Patient presents today for further treatment and evaluation.  Past Medical History:  Diagnosis Date   Anxiety    Ovarian cyst    Pelvic pain    Vaccine for human papilloma virus (HPV) types 6, 11, 16, and 18 administered     Objective:  General: Well developed, nourished, in no acute distress, alert and oriented x3   Dermatology: Skin is warm, dry and supple bilateral.  Medial border right great is tender with evidence of an ingrowing nail. Pain on palpation noted to the border of the nail fold. The remaining nails appear unremarkable at this time. There are no open sores, lesions.  Vascular: DP and PT pulses palpable.  No clinical evidence of vascular compromise  Neruologic: Grossly intact via light touch bilateral.  Musculoskeletal: No pedal deformity noted  Assesement: #1  Recurrent paronychia with ingrowing nail medial border right great  Plan of Care:  1. Patient evaluated.  2. Discussed treatment alternatives and plan of care. Explained nail avulsion procedure and post procedure course to patient. 3. Patient opted for permanent partial nail avulsion of the ingrown portion of the nail.  4. Prior to procedure, local anesthesia infiltration utilized using 3 ml of a 50:50 mixture of 2% plain lidocaine and 0.5% plain marcaine in a normal hallux block fashion and a betadine prep performed.  5. Partial permanent nail avulsion with  chemical matrixectomy performed using 3x30sec applications of phenol followed by alcohol flush.  6. Light dressing applied.  Post care instructions provided 7.  Return to clinic 3 weeks  Felecia Shelling, DPM [facility]  Dr. Felecia Shelling, DPM    [address] [place], [place] [address]                Office [phone]  Fax [phone]

## 2023-02-25 ENCOUNTER — Ambulatory Visit: Payer: BC Managed Care – PPO | Admitting: Podiatry

## 2023-03-11 ENCOUNTER — Ambulatory Visit: Payer: BC Managed Care – PPO | Admitting: Podiatry

## 2023-03-11 DIAGNOSIS — L600 Ingrowing nail: Secondary | ICD-10-CM

## 2023-03-11 NOTE — Progress Notes (Signed)
   Chief Complaint  Patient presents with   Ingrown Toenail    Patient came in today for right hallux ingrown follow-up, medial border, patient denies any pain     Subjective: 32 y.o. female presents today status post permanent nail avulsion procedure of the medial aspect right great toe that was performed on 02/21/2023.  Patient doing well.  No longer has any pain or tenderness to the toe.  Denies any drainage.  She applied the antibiotic ointment and soak the foot as instructed  Past Medical History:  Diagnosis Date   Anxiety    Ovarian cyst    Pelvic pain    Vaccine for human papilloma virus (HPV) types 6, 11, 16, and 18 administered     Objective: Neurovascular status intact.  Skin is warm, dry and supple. Nail and respective nail fold appears to be healing appropriately.   Assessment: #1 s/p partial permanent nail matrixectomy medial border right great toe   Plan of care: #1 patient was evaluated  #2 light debridement of the periungual debris was performed to the border of the respective toe and nail plate using a tissue nipper. #3 patient is to return to clinic on a PRN basis.   Felecia Shelling, DPM [facility]  Dr. Felecia Shelling, DPM    [address] [place], [place] [address]                Office [phone]  Fax [phone]

## 2023-03-13 ENCOUNTER — Ambulatory Visit (INDEPENDENT_AMBULATORY_CARE_PROVIDER_SITE_OTHER): Payer: BC Managed Care – PPO | Admitting: Certified Nurse Midwife

## 2023-03-13 ENCOUNTER — Encounter: Payer: Self-pay | Admitting: Certified Nurse Midwife

## 2023-03-13 VITALS — BP 113/77 | HR 80 | Wt 283.9 lb

## 2023-03-13 DIAGNOSIS — Z3482 Encounter for supervision of other normal pregnancy, second trimester: Secondary | ICD-10-CM

## 2023-03-13 DIAGNOSIS — Z3A24 24 weeks gestation of pregnancy: Secondary | ICD-10-CM

## 2023-03-13 NOTE — Progress Notes (Signed)
ROB doing well, feeling good movement. Has MFM appointment on 6/3. Discussed Glucose screening next visit. Information on eating prior to testing given. Discussed round ligament pain an normal musculoskeletal discomforts of pregnancy. Follow up 4 wks or prn .   Doreene Burke, CNM

## 2023-03-13 NOTE — Patient Instructions (Signed)
Oral Glucose Tolerance Test During Pregnancy Why am I having this test? The oral glucose tolerance test (OGTT) is done to check how your body processes blood sugar (glucose). This is one of several tests used to diagnose diabetes that develops during pregnancy (gestational diabetes mellitus). Gestational diabetes is a short-term form of diabetes that some women develop while they are pregnant. It usually occurs during the second trimester of pregnancy and goes away after delivery. Testing, or screening, for gestational diabetes usually occurs at weeks 24-28 of pregnancy. You may have the OGTT test after having a 1-hour glucose screening test if the results from that test indicate that you may have gestational diabetes. This test may also be needed if: You have a history of gestational diabetes. There is a history of giving birth to very large babies or of losing pregnancies (having stillbirths). You have signs and symptoms of diabetes, such as: Changes in your eyesight. Tingling or numbness in your hands or feet. Changes in hunger, thirst, and urination, and these are not explained by your pregnancy. What is being tested? This test measures the amount of glucose in your blood at different times during a period of 3 hours. This shows how well your body can process glucose. What kind of sample is taken?  Blood samples are required for this test. They are usually collected by inserting a needle into a blood vessel. How do I prepare for this test? For 3 days before your test, eat normally. Have plenty of carbohydrate-rich foods. Follow instructions from your health care provider about: Eating or drinking restrictions on the day of the test. You may be asked not to eat or drink anything other than water (to fast) starting 8-10 hours before the test. Changing or stopping your regular medicines. Some medicines may interfere with this test. Tell a health care provider about: All medicines you are  taking, including vitamins, herbs, eye drops, creams, and over-the-counter medicines. Any blood disorders you have. Any surgeries you have had. Any medical conditions you have. What happens during the test? First, your blood glucose will be measured. This is referred to as your fasting blood glucose because you fasted before the test. Then, you will drink a glucose solution that contains a certain amount of glucose. Your blood glucose will be measured again 1, 2, and 3 hours after you drink the solution. This test takes about 3 hours to complete. You will need to stay at the testing location during this time. During the testing period: Do not eat or drink anything other than the glucose solution. Do not exercise. Do not use any products that contain nicotine or tobacco, such as cigarettes, e-cigarettes, and chewing tobacco. These can affect your test results. If you need help quitting, ask your health care provider. The testing procedure may vary among health care providers and hospitals. How are the results reported? Your results will be reported as milligrams of glucose per deciliter of blood (mg/dL) or millimoles per liter (mmol/L). There is more than one source for screening and diagnosis reference values used to diagnose gestational diabetes. Your health care provider will compare your results to normal values that were established after testing a large group of people (reference values). Reference values may vary among labs and hospitals. For this test (Carpenter-Coustan), reference values are: Fasting: 95 mg/dL (5.3 mmol/L). 1 hour: 180 mg/dL (10.0 mmol/L). 2 hour: 155 mg/dL (8.6 mmol/L). 3 hour: 140 mg/dL (7.8 mmol/L). What do the results mean? Results below the reference values are   considered normal. If two or more of your blood glucose levels are at or above the reference values, you may be diagnosed with gestational diabetes. If only one level is high, your health care provider may  suggest repeat testing or other tests to confirm a diagnosis. Talk with your health care provider about what your results mean. Questions to ask your health care provider Ask your health care provider, or the department that is doing the test: When will my results be ready? How will I get my results? What are my treatment options? What other tests do I need? What are my next steps? Summary The oral glucose tolerance test (OGTT) is one of several tests used to diagnose diabetes that develops during pregnancy (gestational diabetes mellitus). Gestational diabetes is a short-term form of diabetes that some women develop while they are pregnant. You may have the OGTT test after having a 1-hour glucose screening test if the results from that test show that you may have gestational diabetes. You may also have this test if you have any symptoms or risk factors for this type of diabetes. Talk with your health care provider about what your results mean. This information is not intended to replace advice given to you by your health care provider. Make sure you discuss any questions you have with your health care provider. Document Revised: 05/07/2022 Document Reviewed: 03/09/2020 Elsevier Patient Education  2024 Elsevier Inc.  

## 2023-04-01 ENCOUNTER — Telehealth: Payer: Self-pay

## 2023-04-01 NOTE — Telephone Encounter (Signed)
Pt calling; is 27wks; has a ton of pressure with standing.  [phone]  Pt states having the pressure started yesterday; good FM; no leaking of fluid, not having more than four true contractions an hour; has appt Tues.  Explained diff between B-H ctxs and true ctxs.  Adv pt to be checked for a UTI.

## 2023-04-02 ENCOUNTER — Ambulatory Visit (INDEPENDENT_AMBULATORY_CARE_PROVIDER_SITE_OTHER): Payer: BC Managed Care – PPO

## 2023-04-02 VITALS — BP 122/80 | Ht 63.0 in | Wt 286.0 lb

## 2023-04-02 DIAGNOSIS — R102 Pelvic and perineal pain: Secondary | ICD-10-CM

## 2023-04-02 LAB — POCT URINALYSIS DIPSTICK
Bilirubin, UA: NEGATIVE
Glucose, UA: NEGATIVE
Ketones, UA: NEGATIVE
Leukocytes, UA: NEGATIVE
Nitrite, UA: NEGATIVE
Protein, UA: NEGATIVE
Spec Grav, UA: 1.01 (ref 1.010–1.025)
Urobilinogen, UA: 1 E.U./dL
pH, UA: 6.5 (ref 5.0–8.0)

## 2023-04-02 NOTE — Patient Instructions (Signed)
Urinary Tract Infection, Adult A urinary tract infection (UTI) is an infection of any part of the urinary tract. The urinary tract includes: The kidneys. The ureters. The bladder. The urethra. These organs make, store, and get rid of pee (urine) in the body. What are the causes? This infection is caused by germs (bacteria) in your genital area. These germs grow and cause swelling (inflammation) of your urinary tract. What increases the risk? The following factors may make you more likely to develop this condition: Using a small, thin tube (catheter) to drain pee. Not being able to control when you pee or poop (incontinence). Being female. If you are female, these things can increase the risk: Using these methods to prevent pregnancy: A medicine that kills sperm (spermicide). A device that blocks sperm (diaphragm). Having low levels of a female hormone (estrogen). Being pregnant. You are more likely to develop this condition if: You have genes that add to your risk. You are sexually active. You take antibiotic medicines. You have trouble peeing because of: A prostate that is bigger than normal, if you are female. A blockage in the part of your body that drains pee from the bladder. A kidney stone. A nerve condition that affects your bladder. Not getting enough to drink. Not peeing often enough. You have other conditions, such as: Diabetes. A weak disease-fighting system (immune system). Sickle cell disease. Gout. Injury of the spine. What are the signs or symptoms? Symptoms of this condition include: Needing to pee right away. Peeing small amounts often. Pain or burning when peeing. Blood in the pee. Pee that smells bad or not like normal. Trouble peeing. Pee that is cloudy. Fluid coming from the vagina, if you are female. Pain in the belly or lower back. Other symptoms include: Vomiting. Not feeling hungry. Feeling mixed up (confused). This may be the first symptom in  older adults. Being tired and grouchy (irritable). A fever. Watery poop (diarrhea). How is this treated? Taking antibiotic medicine. Taking other medicines. Drinking enough water. In some cases, you may need to see a specialist. Follow these instructions at home:  Medicines Take over-the-counter and prescription medicines only as told by your doctor. If you were prescribed an antibiotic medicine, take it as told by your doctor. Do not stop taking it even if you start to feel better. General instructions Make sure you: Pee until your bladder is empty. Do not hold pee for a long time. Empty your bladder after sex. Wipe from front to back after peeing or pooping if you are a female. Use each tissue one time when you wipe. Drink enough fluid to keep your pee pale yellow. Keep all follow-up visits. Contact a doctor if: You do not get better after 1-2 days. Your symptoms go away and then come back. Get help right away if: You have very bad back pain. You have very bad pain in your lower belly. You have a fever. You have chills. You feeling like you will vomit or you vomit. Summary A urinary tract infection (UTI) is an infection of any part of the urinary tract. This condition is caused by germs in your genital area. There are many risk factors for a UTI. Treatment includes antibiotic medicines. Drink enough fluid to keep your pee pale yellow. This information is not intended to replace advice given to you by your health care provider. Make sure you discuss any questions you have with your health care provider. Document Revised: 05/07/2020 Document Reviewed: 05/12/2020 Elsevier Patient Education    2024 Elsevier Inc.  

## 2023-04-02 NOTE — Telephone Encounter (Signed)
I contacted the patient via phone. The patient has confirmed scheduled appointment for today at 4:15 pm for an UA / nurse visit.

## 2023-04-02 NOTE — Progress Notes (Signed)
    NURSE VISIT NOTE  Subjective:    Patient ID: Hannah Stevens, female    DOB: 04-16-1991, 32 y.o.   MRN: [id number]       HPI  Patient is a 32 y.o. G23P1001 female who presents for pelvic pain for 2 days. Patient does not have a history of recurrent UTI.  Patient does not have a history of pyelonephritis.    Objective:    BP 122/80   Ht 5\' 3"  (1.6 m)   Wt 286 lb (129.7 kg)   LMP 09/27/2022 (Exact Date)   BMI 50.66 kg/m    Lab Review  Results for orders placed or performed in visit on 04/02/23  POCT urinalysis dipstick  Result Value Ref Range   Color, UA     Clarity, UA     Glucose, UA Negative Negative   Bilirubin, UA Negative    Ketones, UA Negative    Spec Grav, UA 1.010 1.010 - 1.025   Blood, UA trace    pH, UA 6.5 5.0 - 8.0   Protein, UA Negative Negative   Urobilinogen, UA 1.0 0.2 or 1.0 E.U./dL   Nitrite, UA Negative    Leukocytes, UA Negative Negative   Appearance     Odor      Assessment:   1. Pelvic pain      Plan:   Urine Culture Sent.Urine dip had a trace of blood.    Cornelius Moras, CMA

## 2023-04-04 LAB — URINE CULTURE

## 2023-04-07 ENCOUNTER — Other Ambulatory Visit: Payer: Self-pay

## 2023-04-07 ENCOUNTER — Ambulatory Visit: Payer: BC Managed Care – PPO | Attending: Maternal & Fetal Medicine

## 2023-04-07 ENCOUNTER — Ambulatory Visit (HOSPITAL_BASED_OUTPATIENT_CLINIC_OR_DEPARTMENT_OTHER): Payer: BC Managed Care – PPO | Admitting: Maternal & Fetal Medicine

## 2023-04-07 DIAGNOSIS — Z3A28 28 weeks gestation of pregnancy: Secondary | ICD-10-CM

## 2023-04-07 DIAGNOSIS — O403XX0 Polyhydramnios, third trimester, not applicable or unspecified: Secondary | ICD-10-CM

## 2023-04-07 DIAGNOSIS — O99213 Obesity complicating pregnancy, third trimester: Secondary | ICD-10-CM

## 2023-04-07 DIAGNOSIS — Z363 Encounter for antenatal screening for malformations: Secondary | ICD-10-CM | POA: Insufficient documentation

## 2023-04-07 DIAGNOSIS — E669 Obesity, unspecified: Secondary | ICD-10-CM

## 2023-04-07 DIAGNOSIS — O409XX0 Polyhydramnios, unspecified trimester, not applicable or unspecified: Secondary | ICD-10-CM | POA: Insufficient documentation

## 2023-04-07 DIAGNOSIS — Z362 Encounter for other antenatal screening follow-up: Secondary | ICD-10-CM

## 2023-04-07 DIAGNOSIS — Z3480 Encounter for supervision of other normal pregnancy, unspecified trimester: Secondary | ICD-10-CM

## 2023-04-07 DIAGNOSIS — O99210 Obesity complicating pregnancy, unspecified trimester: Secondary | ICD-10-CM

## 2023-04-07 NOTE — Progress Notes (Signed)
Patient information  Patient Name: Hannah Stevens  Patient MRN:   [id number]  Referring practice: MFM Referring Provider: Salomon Mast  MFM CONSULT  Hannah Stevens is a 32 y.o. G2P1001 at [redacted]w[redacted]d here for ultrasound and consultation.   Polyhydramnios was seen on today's ultrasound based on the AFI of 27 cm. There cardiac anatomy appears normal. There is no evidence of chorioangioma. The fetal movement is normal without evidence of arthrogryposis. The fetal stomach and bladder appears normal. The fetal palate (although limited in views) and neck appears to be normal without evidence of mass. There is no evidence of of lower spine or pelvic abnormalities.   I discussed the diagnosis, management, prognosis and clinical implications of polyhydramnios and the clinical implications during pregnancy. Amniotic fluid is made from the fetal kidneys and at the normal fluid ranges from 5 to 25 cm on amniotic fluid index.  Potential causes of elevated amniotic fluid in pregnancy. I discussed that most the time the cause is unknown but the most common cause that is identifiable is maternal diabetes.  Also discussed that there is a potential for birth defects or neurologic conditions that can result in elevated amniotic fluid. Potential complications associated with polyhydramnios include malpresentation, increased risk of cord prolapse, increased discomfort during the end of pregnancy, increased risk of preterm contractions and increased risk of stillbirth.  In particular the risk of fetal anomalies and neonatal abnormalities increases with the degree of polyhydramnios.  Approximately 1% of newborns and 5 to 10% of fetuses with mild polyhydramnios will have an abnormality (tracheoesophageal fistula are among the most common abnormalities associated with polyhydramnios).  This increases to 2-5 times a risk with moderate and severe polyhydramnios.  Currently there is no treatment of polyhydramnios if it is idiopathic or  due to fetal abnormalities.  If the elevation in fluid is related to maternal diabetes, better glucose control should reduce the degree of polyhydramnios from osmotic diuresis.  Sonographic findings Single intrauterine pregnancy. Fetal cardiac activity:  Observed and appears normal. Presentation: Variable. The anatomic structures that were well seen appear normal without evidence of soft markers. Due to poor acoustic windows, the visualization some structures remain suboptimally seen. Fetal biometry shows the estimated fetal weight at the 55 percentile. Amniotic fluid: AFI 25.78 cm consistent with polyhydramnios.  Placenta: Posterior. Adnexa: No abnormality visualized. Cervical length: 3.7 cm.  Assessment Patient Active Problem List   Diagnosis Date Noted   Obesity in pregnancy 12/20/2022   Supervision of other normal pregnancy, antepartum 11/22/2022  Plan -Glucose challenge test scheduled for tomorrow  -Serial growth ultrasounds to assess fetal growth and look for potential causes of polyhydramnios and fetus -Antenatal testing is not indicated for isolated polyhydramnios, but since her pre-gravid BMI is > 40, antenatal testing should start around 34 weeks or sooner if she develops diabetes requiring medication to control blood glucose -Delivery timing does not need to be adjusted and delivery at 39 weeks is reasonable -Amniotic fluid reduction is usually reserved for severe probably with extreme maternal discomfort  Review of Systems: A review of systems was performed and was negative except per HPI   Vitals and Physical Exam    04/07/2023    8:01 AM 04/02/2023    4:18 PM 03/13/2023   11:01 AM  Vitals with BMI  Height 5\' 3"  5\' 3"    Weight 283 lbs 286 lbs 283 lbs 14 oz  BMI 50.14 50.68   Systolic 115 122 409  Diastolic 68 80 77  Pulse  93  80  Sitting comfortably on the sonogram table Nonlabored breathing Normal rate and rhythm Abdomen is nontender  Past pregnancies OB  History  Gravida Para Term Preterm AB Living  2 1 1  0 0 1  SAB IAB Ectopic Multiple Live Births  0 0 0 0 1    # Outcome Date GA Lbr Len/2nd Weight Sex Delivery Anes PTL Lv  2 Current           1 Term 06/26/21 [redacted]w[redacted]d 171:40 3110 g F Vag-Vacuum EPI  LIV    Obstetric Comments  1st Menstrual Cycle: 9   1st Pregnancy: N/A     I spent 30 minutes reviewing the patients chart, including labs and images as well as counseling the patient about her medical conditions. Greater than 50% of the time was spent in direct face-to-face patient counseling.  Braxton Feathers  MFM, [facility]   04/07/2023  9:44 AM

## 2023-04-08 ENCOUNTER — Other Ambulatory Visit: Payer: BC Managed Care – PPO

## 2023-04-08 ENCOUNTER — Ambulatory Visit (INDEPENDENT_AMBULATORY_CARE_PROVIDER_SITE_OTHER): Payer: BC Managed Care – PPO

## 2023-04-08 VITALS — BP 115/69 | HR 83 | Wt 283.0 lb

## 2023-04-08 DIAGNOSIS — Z3480 Encounter for supervision of other normal pregnancy, unspecified trimester: Secondary | ICD-10-CM

## 2023-04-08 DIAGNOSIS — O99210 Obesity complicating pregnancy, unspecified trimester: Secondary | ICD-10-CM

## 2023-04-08 DIAGNOSIS — O99213 Obesity complicating pregnancy, third trimester: Secondary | ICD-10-CM

## 2023-04-08 DIAGNOSIS — E669 Obesity, unspecified: Secondary | ICD-10-CM

## 2023-04-08 DIAGNOSIS — Z131 Encounter for screening for diabetes mellitus: Secondary | ICD-10-CM

## 2023-04-08 DIAGNOSIS — Z23 Encounter for immunization: Secondary | ICD-10-CM | POA: Diagnosis not present

## 2023-04-08 DIAGNOSIS — O409XX0 Polyhydramnios, unspecified trimester, not applicable or unspecified: Secondary | ICD-10-CM

## 2023-04-08 DIAGNOSIS — Z3A28 28 weeks gestation of pregnancy: Secondary | ICD-10-CM

## 2023-04-08 DIAGNOSIS — O403XX0 Polyhydramnios, third trimester, not applicable or unspecified: Secondary | ICD-10-CM

## 2023-04-08 LAB — POCT URINALYSIS DIPSTICK OB
Bilirubin, UA: NEGATIVE
Blood, UA: NEGATIVE
Glucose, UA: NEGATIVE
Ketones, UA: NEGATIVE
Leukocytes, UA: NEGATIVE
Nitrite, UA: NEGATIVE
Spec Grav, UA: 1.01 (ref 1.010–1.025)
Urobilinogen, UA: 1 E.U./dL
pH, UA: 6.5 (ref 5.0–8.0)

## 2023-04-08 NOTE — Assessment & Plan Note (Addendum)
-  BMI of 50.13 today, TWG 8 lb per chart, per patient her starting weight was 284 so she has not gained any additional weight at this time.  Initial anesthesia consult on 5/1, plan for follow up anesthesia consult at 36 weeks.  - 28 week growth ultrasound noted polyhydramnios (see other problem). EFW in the 55%tile. Plan for q 4 weeks growth ultrasounds in third trimester.

## 2023-04-08 NOTE — Assessment & Plan Note (Signed)
1 hour glucola, third trimester labs, Tdap, and blood product consent signed. Reviewed kick counts and preterm labor warning signs. Instructed to call office or come to hospital with persistent headache, vision changes, regular contractions, leaking of fluid, decreased fetal movement or vaginal bleeding.

## 2023-04-08 NOTE — Progress Notes (Signed)
    Return Prenatal Note   Assessment/Plan   Plan  32 y.o. G2P1001 at [redacted]w[redacted]d presents for follow-up OB visit. Reviewed prenatal record including previous visit note.  Obesity in pregnancy -BMI of 50.13 today, TWG 8 lb per chart, per patient her starting weight was 284 so she has not gained any additional weight at this time.  Initial anesthesia consult on 5/1, plan for follow up anesthesia consult at 36 weeks.  - 28 week growth ultrasound noted polyhydramnios (see other problem). EFW in the 55%tile. Plan for q 4 weeks growth ultrasounds in third trimester.   Polyhydramnios affecting pregnancy Polyhydramnios identified at 28 week ultrasound with AFI of 27. Follow up MFM ultrasound on 7/22.  Supervision of other normal pregnancy, antepartum 1 hour glucola, third trimester labs, Tdap, and blood product consent signed. Reviewed kick counts and preterm labor warning signs. Instructed to call office or come to hospital with persistent headache, vision changes, regular contractions, leaking of fluid, decreased fetal movement or vaginal bleeding.     Orders Placed This Encounter  Procedures   Tdap vaccine greater than or equal to 7yo IM   28 Week RH+Panel   Return in about 2 weeks (around 04/22/2023) for ROB.   Future Appointments  Date Time Provider Department Center  04/08/2023  9:55 AM Venda Dice, Lindalou Hose, CNM [facility] None  05/05/2023  2:00 PM [facility]    For next visit:  continue with routine prenatal care     Subjective   32 y.o. G2P1001 at [redacted]w[redacted]d presents for this follow-up prenatal visit.  Patient was seen last week for increased pelvic pressure, had a negative urine culture at that time. Continues to feel pelvic pressure, particularly in the afternoons. Patient reports: Movement: Present Contractions: Not present  Objective   Flow sheet Vitals: Pulse Rate: 83 BP: 115/69 Fundal Height: 30 cm Fetal Heart Rate (bpm): 140 Total weight gain: 8 lb (3.629  kg)  General Appearance  No acute distress, well appearing, and well nourished Pulmonary   Normal work of breathing Neurologic   Alert and oriented to person, place, and time Psychiatric   Mood and affect within normal limits  Lindalou Hose Caelum Federici, CNM  06/25/249:54 AM

## 2023-04-08 NOTE — Assessment & Plan Note (Signed)
Polyhydramnios identified at 28 week ultrasound with AFI of 27. Follow up MFM ultrasound on 7/22.

## 2023-04-09 LAB — 28 WEEK RH+PANEL
Basophils Absolute: 0 10*3/uL (ref 0.0–0.2)
Basos: 0 %
EOS (ABSOLUTE): 0.1 10*3/uL (ref 0.0–0.4)
Eos: 1 %
Gestational Diabetes Screen: 84 mg/dL (ref 70–139)
HIV Screen 4th Generation wRfx: NONREACTIVE
Hematocrit: 37 % (ref 34.0–46.6)
Hemoglobin: 12.1 g/dL (ref 11.1–15.9)
Immature Grans (Abs): 0 10*3/uL (ref 0.0–0.1)
Immature Granulocytes: 0 %
Lymphocytes Absolute: 1.8 10*3/uL (ref 0.7–3.1)
Lymphs: 19 %
MCH: 28.9 pg (ref 26.6–33.0)
MCHC: 32.7 g/dL (ref 31.5–35.7)
MCV: 89 fL (ref 79–97)
Monocytes Absolute: 0.4 10*3/uL (ref 0.1–0.9)
Monocytes: 4 %
Neutrophils Absolute: 7.4 10*3/uL — ABNORMAL HIGH (ref 1.4–7.0)
Neutrophils: 76 %
Platelets: 287 10*3/uL (ref 150–450)
RBC: 4.18 x10E6/uL (ref 3.77–5.28)
RDW: 12.7 % (ref 11.7–15.4)
RPR Ser Ql: NONREACTIVE
WBC: 9.8 10*3/uL (ref 3.4–10.8)

## 2023-04-22 ENCOUNTER — Ambulatory Visit (INDEPENDENT_AMBULATORY_CARE_PROVIDER_SITE_OTHER): Payer: BC Managed Care – PPO | Admitting: Certified Nurse Midwife

## 2023-04-22 ENCOUNTER — Encounter: Payer: Self-pay | Admitting: Certified Nurse Midwife

## 2023-04-22 VITALS — BP 112/75 | HR 79 | Wt 285.2 lb

## 2023-04-22 DIAGNOSIS — Z3483 Encounter for supervision of other normal pregnancy, third trimester: Secondary | ICD-10-CM

## 2023-04-22 DIAGNOSIS — Z3A30 30 weeks gestation of pregnancy: Secondary | ICD-10-CM

## 2023-04-22 LAB — POCT URINALYSIS DIPSTICK OB
Bilirubin, UA: NEGATIVE
Blood, UA: NEGATIVE
Glucose, UA: NEGATIVE
Ketones, UA: NEGATIVE
Leukocytes, UA: NEGATIVE
Nitrite, UA: NEGATIVE
POC,PROTEIN,UA: NEGATIVE
Spec Grav, UA: 1.01 (ref 1.010–1.025)
Urobilinogen, UA: 0.2 E.U./dL
pH, UA: 7 (ref 5.0–8.0)

## 2023-04-22 NOTE — Patient Instructions (Signed)
Preterm Labor Pregnancy normally lasts 39-41 weeks. Preterm labor is when labor starts before you have been pregnant for 37 weeks. Babies who are born too early may have a higher risk for long-term problems like cerebral palsy or developmental delays. They may also have problems soon after birth, such as problems with blood sugar, body temperature, heart, and breathing. These problems may be very serious in babies who are born before 34 weeks of pregnancy. What are the causes? The cause of this condition is not known. What increases the risk? You are more likely to have preterm labor if: You have medical problems, now or in the past. You have problems now or in your past pregnancies. You have lifestyle problems. Medical history You have problems of the womb (uterus). You have an infection, including infections you get from sex. You have problems that do not go away, such as: Blood clots. High blood pressure. High blood sugar. You have low body weight or too much body weight. Present and past pregnancies You have had preterm labor before. You are pregnant with two babies or more. You have a condition in which the placenta covers your cervix. You waited less than 18 months between giving birth and becoming pregnant again. Your unborn baby has some problems. You have bleeding from your vagina. You became pregnant by a method called IVF. Lifestyle You smoke. You drink alcohol. You use drugs. You have stress. You have abuse in your home. You come in contact with chemicals that harm the body (pollutants). Other factors You are younger than 17 years or older than 35 years. What are the signs or symptoms? Symptoms of this condition include: Cramps. The cramps may feel like cramps from a period. You may also have watery poop (diarrhea). Pain in the belly (abdomen). Pain in the lower back. Regular contractions. It may feel like your belly is getting tighter. Pressure in the lower  belly. More fluid leaking from the vagina. The fluid may be watery or bloody. Water breaking. How is this treated? Treatment for this condition depends on your health, the health of your baby, and how old your pregnancy is. It may include: Taking medicines, such as: Hormone medicines. Medicines to stop contractions. Medicines to help mature the baby's lungs. Medicines to prevent your baby from getting cerebral palsy or other problems. Bed rest. If the labor happens before 34 weeks of pregnancy, you may need to stay in the hospital. Delivering the baby. Follow these instructions at home:  Do not smoke or use any products that contain nicotine or tobacco. If you need help quitting, ask your doctor. Do not drink alcohol. Take over-the-counter and prescription medicines only as told by your doctor. Rest as told by your doctor. Return to your normal activities when your doctor says that it is safe. Keep all follow-up visits. How is this prevented? To have a healthy pregnancy: Do not use drugs. Do not use any medicines unless you ask your doctor if they are safe for you. Talk with your doctor before taking any herbal supplements. Make sure you gain enough weight. Watch for infection. If you think you might have an infection, get it checked right away. Symptoms of infection may include: Fever. Vaginal discharge that smells bad or is not normal. Pain or burning when you pee. Needing to pee urgently. Needing to pee often. Peeing small amounts often. Blood in your pee. Pee that smells bad or unusual. Where to find more information U.S. Department of Health and Human Services   Office on Women's Health: www.womenshealth.gov The American College of Obstetricians and Gynecologists: www.acog.org Centers for Disease Control and Prevention: www.cdc.gov Contact a doctor if: You think you are going into preterm labor. You have symptoms of preterm labor. You have symptoms of infection. Get help  right away if: You are having painful contractions every 5 minutes or less. Your water breaks. Summary Preterm labor is labor that starts before you reach 37 weeks of pregnancy. Your baby may have problems if delivered early. You are more likely to have preterm labor if you have certain medical problems or problems with a pregnancy now or in the past. Some lifestyle factors can also increase the risk. Contact a doctor if you have symptoms of preterm labor. This information is not intended to replace advice given to you by your health care provider. Make sure you discuss any questions you have with your health care provider. Document Revised: 10/03/2020 Document Reviewed: 10/03/2020 Elsevier Patient Education  2024 [employer] Inc[employer]  

## 2023-04-22 NOTE — Progress Notes (Signed)
ROB doing well, feeling good movement. Discussed polyhydramnios in pregnancy. All questions answered. Discussed braxton hicks ctx. pT notes she has been having them. Reviewed normal braxton hicks vs PTL. Discussed stress and dehydration cause for increased braxton hicks. PT notes she is stressed due to moving and building her house. Encouraged relaxation techniques . She verbalizes and agrees to plan. Follow up 2 wk.  She notes she continues to have lots of pressure. Encouraged use of belly band. Pt states she is planning a tubal for birth control . Discussed completing consent next visit. She desires to have at the hospital before discharge home.   Follow up 2 weeks.  Doreene Burke, CNM

## 2023-04-30 ENCOUNTER — Other Ambulatory Visit: Payer: Self-pay

## 2023-04-30 DIAGNOSIS — O99210 Obesity complicating pregnancy, unspecified trimester: Secondary | ICD-10-CM

## 2023-04-30 DIAGNOSIS — O409XX0 Polyhydramnios, unspecified trimester, not applicable or unspecified: Secondary | ICD-10-CM

## 2023-05-05 ENCOUNTER — Other Ambulatory Visit: Payer: Self-pay

## 2023-05-05 ENCOUNTER — Ambulatory Visit: Payer: BC Managed Care – PPO | Attending: Maternal & Fetal Medicine

## 2023-05-05 VITALS — BP 115/56 | HR 89 | Temp 98.3°F | Ht 63.0 in | Wt 285.0 lb

## 2023-05-05 DIAGNOSIS — O403XX0 Polyhydramnios, third trimester, not applicable or unspecified: Secondary | ICD-10-CM | POA: Diagnosis not present

## 2023-05-05 DIAGNOSIS — E669 Obesity, unspecified: Secondary | ICD-10-CM

## 2023-05-05 DIAGNOSIS — O409XX0 Polyhydramnios, unspecified trimester, not applicable or unspecified: Secondary | ICD-10-CM

## 2023-05-05 DIAGNOSIS — Z3A32 32 weeks gestation of pregnancy: Secondary | ICD-10-CM | POA: Insufficient documentation

## 2023-05-05 DIAGNOSIS — Z3480 Encounter for supervision of other normal pregnancy, unspecified trimester: Secondary | ICD-10-CM

## 2023-05-05 DIAGNOSIS — O99213 Obesity complicating pregnancy, third trimester: Secondary | ICD-10-CM | POA: Diagnosis not present

## 2023-05-05 DIAGNOSIS — O99210 Obesity complicating pregnancy, unspecified trimester: Secondary | ICD-10-CM

## 2023-05-06 ENCOUNTER — Ambulatory Visit (INDEPENDENT_AMBULATORY_CARE_PROVIDER_SITE_OTHER): Payer: BC Managed Care – PPO

## 2023-05-06 VITALS — BP 120/75 | HR 91 | Wt 285.0 lb

## 2023-05-06 DIAGNOSIS — O403XX0 Polyhydramnios, third trimester, not applicable or unspecified: Secondary | ICD-10-CM

## 2023-05-06 DIAGNOSIS — Z3480 Encounter for supervision of other normal pregnancy, unspecified trimester: Secondary | ICD-10-CM

## 2023-05-06 DIAGNOSIS — O409XX0 Polyhydramnios, unspecified trimester, not applicable or unspecified: Secondary | ICD-10-CM

## 2023-05-06 DIAGNOSIS — Z3A32 32 weeks gestation of pregnancy: Secondary | ICD-10-CM

## 2023-05-06 MED ORDER — BLOOD GLUCOSE TEST VI STRP
1.0000 | ORAL_STRIP | Freq: Three times a day (TID) | 0 refills | Status: AC
Start: 2023-05-06 — End: 2023-06-08

## 2023-05-06 MED ORDER — LANCETS MISC. MISC
1.0000 | Freq: Three times a day (TID) | 0 refills | Status: AC
Start: 2023-05-06 — End: 2023-06-05

## 2023-05-06 MED ORDER — BLOOD GLUCOSE MONITORING SUPPL DEVI: 1.0000 | Freq: Three times a day (TID) | 0 refills | Status: DC

## 2023-05-06 MED ORDER — ABDOMINAL BINDER/ELASTIC XL MISC
1.0000 [IU]/d | Freq: Every day | 0 refills | Status: DC
Start: 2023-05-06 — End: 2023-06-14

## 2023-05-06 MED ORDER — LANCET DEVICE MISC
1.0000 | Freq: Three times a day (TID) | 0 refills | Status: AC
Start: 2023-05-06 — End: 2023-06-05

## 2023-05-06 NOTE — Progress Notes (Signed)
    Return Prenatal Note   Assessment/Plan   Plan  32 y.o. G2P1001 at [redacted]w[redacted]d presents for follow-up OB visit. Reviewed prenatal record including previous visit note.  Polyhydramnios affecting pregnancy BMI 50.49. Growth ultrasound yesterday with MFM, EFW 69%tile, AFI 28.88. Per MFM recommendations for polyhydramnios, should start weekly antenatal testing. Antenatal testing scheduled with MFM for 34 and 35 weeks. Patient states that they wanted this testing to be BPPs but there was no room in the schedule, so she is now scheduled for NSTs. MFM also recommended that she monitor her blood sugars for a few days to rule out false negative for GTT. Provided Rx for supplies. BG in office today 123, one hour postprandrial. We reviewed that FBG should be less than 95 and 2 hour postprandial should be less than 140.   Supervision of other normal pregnancy, antepartum Desires BTL, consent signed today. Reviewed kick counts and preterm labor warning signs. Instructed to call office or come to hospital with persistent headache, vision changes, regular contractions, leaking of fluid, decreased fetal movement or vaginal bleeding.    No orders of the defined types were placed in this encounter.  Return for Already scheduled appointment.   Future Appointments  Date Time Provider Department Center  05/20/2023  8:15 AM Joniece Smotherman, Lindalou Hose, CNM [facility] None  05/21/2023 10:30 AM [facility] NST [facility]  05/28/2023 10:30 AM [facility] NST [facility] None  06/02/2023  8:15 AM Doreene Burke, CNM [facility] None  06/04/2023  3:00 PM [facility] NST [facility] None  06/09/2023  8:15 AM Deanta Mincey, Lindalou Hose, CNM [facility] None  06/17/2023  8:15 AM Ilay Capshaw, Lindalou Hose, CNM [facility] None  06/24/2023  8:15 AM Doreene Burke, CNM [facility] None    For next visit:  continue with routine prenatal care     Subjective   32 y.o. G2P1001 at [redacted]w[redacted]d presents for this follow-up prenatal visit.  Patient is feeling runs of contractions in the evenings that  get less intense when she sits down and rests. She only notices intermittent contractions throughout the day.  Patient reports: Movement: Present Contractions: Irritability  Objective   Flow sheet Vitals: Pulse Rate: 91 BP: 120/75 Fundal Height: 34 cm Fetal Heart Rate (bpm): 155 Presentation: Vertex Total weight gain: 10 lb (4.536 kg)  General Appearance  No acute distress, well appearing, and well nourished Pulmonary   Normal work of breathing Neurologic   Alert and oriented to person, place, and time Psychiatric   Mood and affect within normal limits  Lindalou Hose Silvino Selman, CNM  05/05/2409:28 AM

## 2023-05-06 NOTE — Assessment & Plan Note (Signed)
Desires BTL, consent signed today. Reviewed kick counts and preterm labor warning signs. Instructed to call office or come to hospital with persistent headache, vision changes, regular contractions, leaking of fluid, decreased fetal movement or vaginal bleeding.

## 2023-05-06 NOTE — Assessment & Plan Note (Addendum)
BMI 50.49. Growth ultrasound yesterday with MFM, EFW 69%tile, AFI 28.88. Per MFM recommendations for polyhydramnios, should start weekly antenatal testing. Antenatal testing scheduled with MFM for 34 and 35 weeks. Patient states that they wanted this testing to be BPPs but there was no room in the schedule, so she is now scheduled for NSTs. MFM also recommended that she monitor her blood sugars for a few days to rule out false negative for GTT. Provided Rx for supplies. BG in office today 123, one hour postprandrial. We reviewed that FBG should be less than 95 and 2 hour postprandial should be less than 140.

## 2023-05-06 NOTE — Addendum Note (Signed)
Addended by: Autumn Messing on: 05/06/2023 04:09 PM   Modules accepted: Orders

## 2023-05-20 ENCOUNTER — Ambulatory Visit: Payer: BC Managed Care – PPO

## 2023-05-20 ENCOUNTER — Ambulatory Visit (INDEPENDENT_AMBULATORY_CARE_PROVIDER_SITE_OTHER): Payer: BC Managed Care – PPO

## 2023-05-20 VITALS — BP 124/80 | HR 86 | Wt 286.0 lb

## 2023-05-20 VITALS — BP 124/80 | HR 112 | Wt 286.8 lb

## 2023-05-20 DIAGNOSIS — O24414 Gestational diabetes mellitus in pregnancy, insulin controlled: Secondary | ICD-10-CM | POA: Insufficient documentation

## 2023-05-20 DIAGNOSIS — Z3A34 34 weeks gestation of pregnancy: Secondary | ICD-10-CM

## 2023-05-20 DIAGNOSIS — Z131 Encounter for screening for diabetes mellitus: Secondary | ICD-10-CM

## 2023-05-20 DIAGNOSIS — Z3480 Encounter for supervision of other normal pregnancy, unspecified trimester: Secondary | ICD-10-CM

## 2023-05-20 DIAGNOSIS — O403XX0 Polyhydramnios, third trimester, not applicable or unspecified: Secondary | ICD-10-CM

## 2023-05-20 DIAGNOSIS — O24419 Gestational diabetes mellitus in pregnancy, unspecified control: Secondary | ICD-10-CM

## 2023-05-20 DIAGNOSIS — O409XX0 Polyhydramnios, unspecified trimester, not applicable or unspecified: Secondary | ICD-10-CM

## 2023-05-20 DIAGNOSIS — J029 Acute pharyngitis, unspecified: Secondary | ICD-10-CM | POA: Diagnosis not present

## 2023-05-20 DIAGNOSIS — J069 Acute upper respiratory infection, unspecified: Secondary | ICD-10-CM | POA: Diagnosis not present

## 2023-05-20 DIAGNOSIS — O99210 Obesity complicating pregnancy, unspecified trimester: Secondary | ICD-10-CM

## 2023-05-20 LAB — POCT URINALYSIS DIPSTICK OB
Bilirubin, UA: NEGATIVE
Blood, UA: NEGATIVE
Glucose, UA: NEGATIVE
Ketones, UA: NEGATIVE
Leukocytes, UA: NEGATIVE
Nitrite, UA: NEGATIVE
POC,PROTEIN,UA: NEGATIVE
Spec Grav, UA: 1.015 (ref 1.010–1.025)
Urobilinogen, UA: 0.2 E.U./dL
pH, UA: 6.5 (ref 5.0–8.0)

## 2023-05-20 LAB — GLUCOSE, POCT (MANUAL RESULT ENTRY): POC Glucose: 108 mg/dl — AB (ref 70–99)

## 2023-05-20 NOTE — Assessment & Plan Note (Signed)
Reactive NST today. Has follow up ultrasound with MFM next week.

## 2023-05-20 NOTE — Assessment & Plan Note (Signed)
Checking blood sugars per MFM recommendations and concern for missed GDM diagnosis.  At 34 wks, after one week of checking, all FBG > 100. All 2 hour postprandial, < 140 except for two when she drank a large sugar drink. Would consider her to have GDM at this point. Will continue monitoring blood sugars QID. May consider medication with continued elevated FBG.

## 2023-05-20 NOTE — Assessment & Plan Note (Signed)
Recommended patient go to urgent care for respiratory illness testing. Contacted her mother to pick her up as she seemed too light-headed to drive herself.

## 2023-05-20 NOTE — Progress Notes (Signed)
    Return Prenatal Note   Assessment/Plan   Plan  32 y.o. G2P1001 at [redacted]w[redacted]d presents for follow-up OB visit. Reviewed prenatal record including previous visit note.  Gestational diabetes Checking blood sugars per MFM recommendations and concern for missed GDM diagnosis.  At 34 wks, after one week of checking, all FBG > 100. All 2 hour postprandial, < 140 except for two when she drank a large sugar drink. Would consider her to have GDM at this point. Will continue monitoring blood sugars QID. May consider medication with continued elevated FBG.   Polyhydramnios affecting pregnancy Reactive NST today. Has follow up ultrasound with MFM next week.   Supervision of other normal pregnancy, antepartum Recommended patient go to urgent care for respiratory illness testing. Contacted her mother to pick her up as she seemed too light-headed to drive herself.    Orders Placed This Encounter  Procedures   POC Urinalysis Dipstick OB   POCT glucose (manual entry)   No follow-ups on file.   Future Appointments  Date Time Provider Department Center  05/28/2023 10:30 AM [facility] NST [facility] None  06/02/2023  8:15 AM Doreene Burke, CNM [facility] None  06/04/2023  3:00 PM [facility]  06/09/2023  8:00 AM [facility] ROOM [facility] None  06/09/2023  8:15 AM Inocencia Murtaugh, Lindalou Hose, CNM [facility] None  06/17/2023  8:00 AM [facility] ROOM [facility] None  06/17/2023  8:15 AM Linden Tagliaferro, Lindalou Hose, CNM [facility] None  06/24/2023  8:00 AM [facility] ROOM [facility] None  06/24/2023  8:15 AM Doreene Burke, CNM [facility] None    For next visit:   Continue with weekly antenatal testing     Subjective   31 y.o. G2P1001 at [redacted]w[redacted]d presents for this follow-up prenatal visit.  Patient is feeling very poorly today. States that she has a sore throat and has not been feeling herself for the past few days. She thought she was fatigued due to a recent move, but today feels more light-headed and like she might pass out. Patient  reports: Movement: Present Contractions: Irritability  Objective   Flow sheet Vitals: Pulse Rate: 86 BP: 124/80 Fundal Height: 36 cm Fetal Heart Rate (bpm): RNST Total weight gain: 11 lb 12.8 oz (5.352 kg)  General Appearance  No acute distress, well appearing, and well nourished Pulmonary   Normal work of breathing Neurologic   Alert and oriented to person, place, and time Psychiatric   Mood and affect within normal limits  Lindalou Hose Aithana Kushner, CNM  05/20/2411:39 PM

## 2023-05-20 NOTE — Progress Notes (Signed)
    NURSE VISIT NOTE  Subjective:    Patient ID: Hannah Stevens, female    DOB: Apr 12, 1991, 32 y.o.   MRN: [id number]  HPI  Patient is a 32 y.o. G52P1001 female who presents for fetal monitoring per order from Hildred Laser, MD.   Objective:    LMP 09/27/2022 (Exact Date)  Estimated Date of Delivery: 06/27/23  [redacted]w[redacted]d  Fetus A Non-Stress Test Interpretation for 05/20/23  Indication: Diabetes, Obesity, and Polyhydramnios  Fetal Heart Rate A Mode: Doppler Baseline Rate (A): 150 bpm Variability: Moderate Accelerations: 15 x 15 Decelerations: None Multiple birth?: No  Uterine Activity Mode: Toco Contraction Frequency (min): uterine irritablitity  Interpretation (Fetal Testing) Nonstress Test Interpretation: Reactive Overall Impression: Reassuring for gestational age   Assessment:   1. Polyhydramnios affecting pregnancy   2. Gestational diabetes mellitus (GDM) in third trimester, gestational diabetes method of control unspecified      Plan:   Results reviewed and discussed with patient by  Autumn Messing, CNM.    Santiago Bumpers, CMA [facility]

## 2023-05-21 ENCOUNTER — Other Ambulatory Visit: Payer: BC Managed Care – PPO

## 2023-05-27 ENCOUNTER — Other Ambulatory Visit: Payer: Self-pay

## 2023-05-27 DIAGNOSIS — O0993 Supervision of high risk pregnancy, unspecified, third trimester: Secondary | ICD-10-CM

## 2023-05-27 DIAGNOSIS — Z6841 Body Mass Index (BMI) 40.0 and over, adult: Secondary | ICD-10-CM

## 2023-05-28 ENCOUNTER — Other Ambulatory Visit: Payer: Self-pay

## 2023-05-28 ENCOUNTER — Ambulatory Visit (HOSPITAL_BASED_OUTPATIENT_CLINIC_OR_DEPARTMENT_OTHER): Payer: BC Managed Care – PPO | Admitting: Maternal & Fetal Medicine

## 2023-05-28 ENCOUNTER — Telehealth: Payer: Self-pay

## 2023-05-28 ENCOUNTER — Ambulatory Visit: Payer: BC Managed Care – PPO | Attending: Maternal & Fetal Medicine

## 2023-05-28 VITALS — BP 118/62 | HR 75 | Temp 98.3°F | Ht 63.0 in | Wt 283.5 lb

## 2023-05-28 DIAGNOSIS — O99210 Obesity complicating pregnancy, unspecified trimester: Secondary | ICD-10-CM

## 2023-05-28 DIAGNOSIS — E669 Obesity, unspecified: Secondary | ICD-10-CM

## 2023-05-28 DIAGNOSIS — O99213 Obesity complicating pregnancy, third trimester: Secondary | ICD-10-CM

## 2023-05-28 DIAGNOSIS — Z3A35 35 weeks gestation of pregnancy: Secondary | ICD-10-CM

## 2023-05-28 DIAGNOSIS — O409XX0 Polyhydramnios, unspecified trimester, not applicable or unspecified: Secondary | ICD-10-CM

## 2023-05-28 DIAGNOSIS — Z3480 Encounter for supervision of other normal pregnancy, unspecified trimester: Secondary | ICD-10-CM

## 2023-05-28 DIAGNOSIS — O24419 Gestational diabetes mellitus in pregnancy, unspecified control: Secondary | ICD-10-CM

## 2023-05-28 DIAGNOSIS — O0993 Supervision of high risk pregnancy, unspecified, third trimester: Secondary | ICD-10-CM

## 2023-05-28 DIAGNOSIS — O403XX0 Polyhydramnios, third trimester, not applicable or unspecified: Secondary | ICD-10-CM

## 2023-05-28 DIAGNOSIS — O24414 Gestational diabetes mellitus in pregnancy, insulin controlled: Secondary | ICD-10-CM

## 2023-05-28 MED ORDER — INSULIN STARTER KIT- PEN NEEDLES (ENGLISH)
1.0000 | Freq: Once | Status: DC
Start: 2023-05-28 — End: 2023-06-14

## 2023-05-28 MED ORDER — INSULIN NPH (HUMAN) (ISOPHANE) 100 UNIT/ML ~~LOC~~ SUSP
6.0000 [IU] | Freq: Every evening | SUBCUTANEOUS | 0 refills | Status: DC
Start: 2023-05-28 — End: 2023-06-14

## 2023-05-28 NOTE — Procedures (Signed)
Hannah Stevens [birth year] [redacted]w[redacted]d  Fetus A Non-Stress Test Interpretation for 05/28/23  Indication: Polyhydramnios, GDM, Obesity  Fetal Heart Rate A Mode: External Baseline Rate (A): 135 bpm Variability: Moderate Accelerations: 15 x 15 Decelerations: None Multiple birth?: No  Uterine Activity Mode: Toco  Interpretation (Fetal Testing) Nonstress Test Interpretation: Reactive (Dr.Schiable Interpreted NST)

## 2023-05-28 NOTE — Progress Notes (Signed)
   Patient information  Patient Name: Hannah Stevens  Patient MRN:   [id number]  Referring practice: MFM Referring Provider: Salomon Mast  MFM CONSULT  Marcell Barlow Sease is a 32 y.o. G2P1001 at [redacted]w[redacted]d here for NST and consultation.   The patient is here for an NST which was reactive.  However on a previous ultrasound the estimated fetal weight was in the 90th percentile around the abdomen and she had polyhydramnios.  I discussed that even though she passed her glucose screening test she could still have gestational diabetes due to the 10 to 15% false-negative rate.  She has been checking her blood sugars 4 times a day and reports elevated fasting blood sugars in the 100-120 range.  Her postprandial blood sugars are mostly less than 120 at least 50% of the time.  She rarely has a value over 130.  I discussed that since she is close to delivery it would be ideal to improve her blood sugars to avoid an increased risk of stillbirth as well as prolonged NICU time due to glucose instability around the time of birth.  I discussed that 6 units of NPH can be started at night to help target her fasting blood sugar.  She is agreeable to this plan.  I will notify her OB providers to prescribe her insulin, needles and syringes.  Assessment Insulin controlled gestational diabetes mellitus (GDM) in third trimester  Supervision of other normal pregnancy, antepartum  Obesity in pregnancy  Polyhydramnios affecting pregnancy Plan -NST was reactive today -Notified her OB providers to prescribe 6 units of NPH at night in addition to syringes and needles. -Delivery timing will be based on her glucose log results as well as her future ultrasounds.  If she continues to have polyhydramnios or elevated blood sugars then delivery should occur around 37 to 38 weeks.  If the polyhydramnios resolves and her blood sugars are well-controlled she can go to 39 weeks.  Review of Systems: A review of systems was performed and was  negative except per HPI   Vitals and Physical Exam    05/28/2023   10:45 AM 05/20/2023   11:48 AM 05/20/2023   11:47 AM  Vitals with BMI  Height 5\' 3"     Weight 283 lbs 8 oz 286 lbs 13 oz 286 lbs  BMI 50.23    Systolic 118 124 253  Diastolic 62 80 80  Pulse 75 112 86  Sitting comfortably on the NST table Nonlabored breathing Normal rate and rhythm Abdomen is nontender  Past pregnancies OB History  Gravida Para Term Preterm AB Living  2 1 1  0 0 1  SAB IAB Ectopic Multiple Live Births  0 0 0 0 1    # Outcome Date GA Lbr Len/2nd Weight Sex Type Anes PTL Lv  2 Current           1 Term 06/26/21 [redacted]w[redacted]d 171:40 3110 g F Vag-Vacuum EPI  LIV    Obstetric Comments  1st Menstrual Cycle: 9   1st Pregnancy: N/A    I spent 30 minutes reviewing the patients chart, including labs and images as well as counseling the patient about her medical conditions. Greater than 50% of the time was spent in direct face-to-face patient counseling.  Braxton Feathers  MFM, [facility]   05/28/2023  12:33 PM

## 2023-05-28 NOTE — Progress Notes (Signed)
Erroneous encounter

## 2023-05-28 NOTE — Telephone Encounter (Signed)
Hannah Stevens had called triage wondering if [facility] sent in her insulin or was a message sent to a provider here in office.

## 2023-05-29 ENCOUNTER — Encounter: Payer: BC Managed Care – PPO | Attending: Obstetrics and Gynecology | Admitting: Dietician

## 2023-05-29 DIAGNOSIS — Z713 Dietary counseling and surveillance: Secondary | ICD-10-CM | POA: Diagnosis not present

## 2023-05-29 DIAGNOSIS — Z3A00 Weeks of gestation of pregnancy not specified: Secondary | ICD-10-CM | POA: Diagnosis not present

## 2023-05-29 DIAGNOSIS — O24419 Gestational diabetes mellitus in pregnancy, unspecified control: Secondary | ICD-10-CM | POA: Insufficient documentation

## 2023-05-29 DIAGNOSIS — O24414 Gestational diabetes mellitus in pregnancy, insulin controlled: Secondary | ICD-10-CM

## 2023-05-29 NOTE — Progress Notes (Signed)
Diabetes Self-Management Education  Visit Type: First/Initial  Appt. Start Time: 0830 Appt. End Time: 0900  05/29/2023  [name], identified by name and date of birth, is a 32 y.o. female with a diagnosis of Diabetes: Gestational Diabetes.   ASSESSMENT  Last menstrual period 09/27/2022, currently breastfeeding. There is no height or weight on file to calculate BMI.   Diabetes Self-Management Education - 05/29/23 0951       Visit Information   Visit Type First/Initial      Initial Visit   Diabetes Type Gestational Diabetes    Are you currently following a meal plan? No    Are you taking your medications as prescribed? Yes      Psychosocial Assessment   Learning Readiness Change in progress    How often do you need to have someone help you when you read instructions, pamphlets, or other written materials from your doctor or pharmacy? 1 - Never      Pre-Education Assessment   Patient understands the diabetes disease and treatment process. Needs Review    Patient understands incorporating nutritional management into lifestyle. Needs Instruction    Patient undertands incorporating physical activity into lifestyle. Needs Instruction    Patient understands using medications safely. Comprehends key points    Patient understands monitoring blood glucose, interpreting and using results Demonstrates understanding / competency    Patient understands prevention, detection, and treatment of chronic complications. N/A (comment)    Patient understands how to develop strategies to address psychosocial issues. Needs Review    Patient understands how to develop strategies to promote health/change behavior. Comprehends key points      Complications   How often do you check your blood sugar? > 4 times/day   has cgm   Fasting Blood glucose range (mg/dL) 62-952    Number of hypoglycemic episodes per month 0    Are you checking your feet? N/A      Dietary Intake   Breakfast biscuit     Dinner 6pm    Snack (evening) none    Beverage(s) water, milk      Activity / Exercise   Activity / Exercise Type ADL's      Patient Education   Healthy Eating Role of diet in the treatment of diabetes and the relationship between the three main macronutrients and blood glucose level;Plate Method;Meal timing in regards to the patients' current diabetes medication.;Other (comment)   general nutrition guidelines for gestational diabetes   Medications Taught/reviewed insulin/injectables, injection, site rotation, insulin/injectables storage and needle disposal.;Reviewed patients medication for diabetes, action, purpose, timing of dose and side effects.    Monitoring Taught/discussed recording of test results and interpretation of SMBG.    Acute complications Taught prevention, symptoms, and  treatment of hypoglycemia - the 15 rule.      Post-Education Assessment   Patient understands the diabetes disease and treatment process. Comprehends key points    Patient understands incorporating nutritional management into lifestyle. Comprehends key points    Patient undertands incorporating physical activity into lifestyle. Needs Review    Patient understands using medications safely. Demonstrates understanding / competency    Patient understands monitoring blood glucose, interpreting and using results Comprehends key points    Patient understands prevention, detection, and treatment of acute complications. Demonstrates understanding / competency    Patient understands prevention, detection, and treatment of chronic complications. N/A    Patient understands how to develop strategies to address psychosocial issues. Needs Review    Patient understands how to develop  strategies to promote health/change behavior. Comprehends key points      Outcomes   Expected Outcomes Demonstrated interest in learning. Expect positive outcomes    Program Status Completed             Individualized Plan for  Diabetes Self-Management Training:   Learning Objective:  Patient will have a greater understanding of diabetes self-management. Patient will perform insulin injections accurately, understand possible side effect of hypoglycemia and how to treat appropriately.  Other intervention notes: Instructed on insulin administration with sample syringe and bottle. 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  Instructed on proper treatment of hypoglycemia Provided basic meal planning instruction with appropriate carb intake and inclusion of protein source with each meal and snack. Encouraged bedtime snack with protein in effort to improve fasting BGs and prevent potential nocturnal hypoglycemia.   Plan:   There are no Patient Instructions on file for this visit.  Expected Outcomes:  Demonstrated interest in learning. Expect positive outcomes  Education material provided:  Insulin Instruction sheet with timing and peak of insulin. Plate planner with food lists General Nutrition Guidelines for Gestational Diabetes  If problems or questions, patient to contact team via:  Phone  Future DSME appointment: prn

## 2023-06-02 ENCOUNTER — Ambulatory Visit (INDEPENDENT_AMBULATORY_CARE_PROVIDER_SITE_OTHER): Payer: BC Managed Care – PPO | Admitting: Certified Nurse Midwife

## 2023-06-02 ENCOUNTER — Other Ambulatory Visit (HOSPITAL_COMMUNITY)
Admission: RE | Admit: 2023-06-02 | Discharge: 2023-06-02 | Disposition: A | Discharge status: Home / Self Care | Payer: BC Managed Care – PPO | Source: Ambulatory Visit | Attending: Certified Nurse Midwife | Admitting: Certified Nurse Midwife

## 2023-06-02 ENCOUNTER — Encounter: Payer: Self-pay | Admitting: Certified Nurse Midwife

## 2023-06-02 VITALS — BP 125/76 | HR 91 | Wt 280.8 lb

## 2023-06-02 DIAGNOSIS — Z113 Encounter for screening for infections with a predominantly sexual mode of transmission: Secondary | ICD-10-CM | POA: Diagnosis not present

## 2023-06-02 DIAGNOSIS — Z3685 Encounter for antenatal screening for Streptococcus B: Secondary | ICD-10-CM | POA: Diagnosis not present

## 2023-06-02 DIAGNOSIS — O0993 Supervision of high risk pregnancy, unspecified, third trimester: Secondary | ICD-10-CM

## 2023-06-02 DIAGNOSIS — Z3A36 36 weeks gestation of pregnancy: Secondary | ICD-10-CM

## 2023-06-02 LAB — POCT URINALYSIS DIPSTICK OB
Bilirubin, UA: NEGATIVE
Blood, UA: NEGATIVE
Glucose, UA: NEGATIVE
Ketones, UA: NEGATIVE
Leukocytes, UA: NEGATIVE
Nitrite, UA: NEGATIVE
POC,PROTEIN,UA: NEGATIVE
Spec Grav, UA: 1.01 (ref 1.010–1.025)
Urobilinogen, UA: 0.2 E.U./dL
pH, UA: 7.5 (ref 5.0–8.0)

## 2023-06-02 NOTE — Patient Instructions (Signed)

## 2023-06-02 NOTE — Progress Notes (Signed)
ROB , doing well. BS log reviewed.  Pt is feeling good movement. Lots of pressure. Request SVE today, reviewed possible ctx and vaginal bleeding. She verbalizes understanding and would still like me to check. FT-1 cm , high difficult to assess effacement , ballotable. GBS and cultures collected prior to exam . Pt state MFM recommended induction 37 weeks. Will consult with MD regarding recommendation . No note seen from MFM with this recommendation.   BS LOG   8/15 : 101-(f )  87 (2hrpp)   105 (2hrpp)  72 (2hr pp)  8/16:  Start insulin   86-(f)  83 (2hpp) 88(2 hrpp) 94 (2 hrpp)   8/17:  92(f)  71(2hrpp)  82 (2hrpp)  79 (2hrpp)   8/18 : 79 (f)   72 (2hrpp) 88 (2hpp) 98 (2hrpp)   8/19 : 76 (f)   She has appointment next week for u/s & Bpp with MFM  Folllow up 1 wk  Doreene Burke, CNM

## 2023-06-03 ENCOUNTER — Other Ambulatory Visit: Payer: Self-pay

## 2023-06-03 DIAGNOSIS — O24414 Gestational diabetes mellitus in pregnancy, insulin controlled: Secondary | ICD-10-CM

## 2023-06-03 DIAGNOSIS — O99210 Obesity complicating pregnancy, unspecified trimester: Secondary | ICD-10-CM

## 2023-06-03 DIAGNOSIS — O409XX0 Polyhydramnios, unspecified trimester, not applicable or unspecified: Secondary | ICD-10-CM

## 2023-06-03 LAB — CERVICOVAGINAL ANCILLARY ONLY
Chlamydia: NEGATIVE
Comment: NEGATIVE
Comment: NORMAL
Neisseria Gonorrhea: NEGATIVE

## 2023-06-04 ENCOUNTER — Other Ambulatory Visit: Payer: Self-pay

## 2023-06-04 ENCOUNTER — Ambulatory Visit: Payer: BC Managed Care – PPO | Attending: Maternal & Fetal Medicine

## 2023-06-04 ENCOUNTER — Other Ambulatory Visit: Payer: BC Managed Care – PPO

## 2023-06-04 DIAGNOSIS — O24414 Gestational diabetes mellitus in pregnancy, insulin controlled: Secondary | ICD-10-CM | POA: Diagnosis not present

## 2023-06-04 DIAGNOSIS — E669 Obesity, unspecified: Secondary | ICD-10-CM

## 2023-06-04 DIAGNOSIS — O3663X0 Maternal care for excessive fetal growth, third trimester, not applicable or unspecified: Secondary | ICD-10-CM | POA: Diagnosis not present

## 2023-06-04 DIAGNOSIS — O403XX0 Polyhydramnios, third trimester, not applicable or unspecified: Secondary | ICD-10-CM | POA: Diagnosis not present

## 2023-06-04 DIAGNOSIS — O409XX0 Polyhydramnios, unspecified trimester, not applicable or unspecified: Secondary | ICD-10-CM

## 2023-06-04 DIAGNOSIS — Z3A36 36 weeks gestation of pregnancy: Secondary | ICD-10-CM

## 2023-06-04 DIAGNOSIS — O99213 Obesity complicating pregnancy, third trimester: Secondary | ICD-10-CM | POA: Diagnosis not present

## 2023-06-04 DIAGNOSIS — O99210 Obesity complicating pregnancy, unspecified trimester: Secondary | ICD-10-CM

## 2023-06-05 LAB — CULTURE, BETA STREP (GROUP B ONLY): Strep Gp B Culture: NEGATIVE

## 2023-06-06 ENCOUNTER — Ambulatory Visit (INDEPENDENT_AMBULATORY_CARE_PROVIDER_SITE_OTHER): Payer: BC Managed Care – PPO | Admitting: Certified Nurse Midwife

## 2023-06-06 VITALS — BP 116/79 | HR 89 | Wt 279.9 lb

## 2023-06-06 DIAGNOSIS — Z3A37 37 weeks gestation of pregnancy: Secondary | ICD-10-CM

## 2023-06-06 DIAGNOSIS — O0993 Supervision of high risk pregnancy, unspecified, third trimester: Secondary | ICD-10-CM

## 2023-06-06 LAB — POCT URINALYSIS DIPSTICK OB
Bilirubin, UA: NEGATIVE
Blood, UA: NEGATIVE
Glucose, UA: NEGATIVE
Ketones, UA: NEGATIVE
Leukocytes, UA: NEGATIVE
Nitrite, UA: NEGATIVE
POC,PROTEIN,UA: NEGATIVE
Spec Grav, UA: <=1.005 — AB (ref 1.010–1.025)
Urobilinogen, UA: 0.2 E.U./dL
pH, UA: 7.5 (ref 5.0–8.0)

## 2023-06-06 NOTE — Patient Instructions (Signed)
.  Braxton Hicks Contractions  Contractions of the uterus can occur throughout pregnancy, but they are not always a sign that you are in labor. You may have practice contractions called Braxton Hicks contractions. These false labor contractions are sometimes confused with true labor. What are Braxton Hicks contractions? Braxton Hicks contractions are tightening movements that occur in the muscles of the uterus before labor. Unlike true labor contractions, these contractions do not result in opening (dilation) and thinning of the lowest part of the uterus (cervix). Toward the end of pregnancy (32-34 weeks), Braxton Hicks contractions can happen more often and may become stronger. These contractions are sometimes difficult to tell apart from true labor because they can be very uncomfortable. How to tell the difference between true labor and false labor True labor Contractions last 30-70 seconds. Contractions become very regular. Discomfort is usually felt in the top of the uterus, and it spreads to the lower abdomen and low back. Contractions do not go away with walking. Contractions usually become stronger and more frequent. The cervix dilates and gets thinner. False labor Contractions are usually shorter, weaker, and farther apart than true labor contractions. Contractions are usually irregular. Contractions are often felt in the front of the lower abdomen and in the groin. Contractions may go away when you walk around or change positions while lying down. The cervix usually does not dilate or become thin. Sometimes, the only way to tell if you are in true labor is for your health care provider to look for changes in your cervix. Your health care provider will do a physical exam and may monitor your contractions. If you are in true labor, your health care provider will send you home with instructions about when to return to the hospital. You may continue to have Braxton Hicks contractions until  you go into true labor. Follow these instructions at home:  Take over-the-counter and prescription medicines only as told by your health care provider. If Braxton Hicks contractions are making you uncomfortable: Change your position from lying down or resting to walking, or change from walking to resting. Sit and rest in a tub of warm water. Drink enough fluid to keep your urine pale yellow. Dehydration may cause these contractions. Do slow and deep breathing several times an hour. Keep all follow-up visits. This is important. Contact a health care provider if: You have a fever. You have continuous pain in your abdomen. Your contractions become stronger, more regular, and closer together. You pass blood-tinged mucus. Get help right away if: You have fluid leaking or gushing from your vagina. You have bright red blood coming from your vagina. Your baby is not moving inside you as much as it used to. Summary You may have practice contractions called Braxton Hicks contractions. These false labor contractions are sometimes confused with true labor. Braxton Hicks contractions are usually shorter, weaker, farther apart, and less regular than true labor contractions. True labor contractions usually become stronger, more regular, and more frequent. Manage discomfort from Braxton Hicks contractions by changing position, resting in a warm bath, practicing deep breathing, and drinking plenty of water. Keep all follow-up visits. Contact your health care provider if your contractions become stronger, more regular, and closer together. This information is not intended to replace advice given to you by your health care provider. Make sure you discuss any questions you have with your health care provider. Document Revised: 08/07/2020 Document Reviewed: 08/07/2020 Elsevier Patient Education  2024 [employer]  

## 2023-06-06 NOTE — Progress Notes (Signed)
ROB doing well. Pt had bpp with MFM 8/21 8/8 She is measuring 92 percentile with AFI 31.45 . MFM recommends induction in 37th week. Discussed with Valentino Saxon she is in agreement . Will scheduled today.   Blood sugars have been well controled with one fasting at 105 after having cookies the night before. All other values were in range.   She will follow up in office on Monday for ROB and NST .  Doreene Burke, CNM   Induction scheduled 8/29 @0500  . Orders placed.

## 2023-06-06 NOTE — Progress Notes (Unsigned)
    NURSE VISIT NOTE  Subjective:    Patient ID: Hannah Stevens, female    DOB: 24-Aug-1991, 32 y.o.   MRN: [id number]  HPI  Patient is a 32 y.o. G77P1001 female who presents for fetal monitoring per order from Doreene Burke, CNM.   Objective:    LMP 09/27/2022 (Exact Date)  Estimated Date of Delivery: 06/27/23  [redacted]w[redacted]d  Fetus A Non-Stress Test Interpretation for 06/06/23  Indication: Gestational Diabetes medication controlled, Obesity, and Polyhydramnios            Assessment:   1. Insulin controlled gestational diabetes mellitus (GDM) in third trimester   2. Polyhydramnios affecting pregnancy in third trimester   3. Obesity affecting pregnancy in third trimester, unspecified obesity type   4. [redacted] weeks gestation of pregnancy      Plan:   Results reviewed and discussed with patient by  Autumn Messing, CNM.     Rocco Serene, LPN

## 2023-06-06 NOTE — Patient Instructions (Signed)

## 2023-06-09 ENCOUNTER — Other Ambulatory Visit: Payer: BC Managed Care – PPO

## 2023-06-09 ENCOUNTER — Encounter
Admission: RE | Admit: 2023-06-09 | Discharge: 2023-06-09 | Disposition: A | Discharge status: Home / Self Care | Payer: BC Managed Care – PPO | Source: Ambulatory Visit

## 2023-06-09 ENCOUNTER — Ambulatory Visit: Payer: BC Managed Care – PPO

## 2023-06-09 ENCOUNTER — Encounter: Payer: Self-pay | Admitting: Certified Nurse Midwife

## 2023-06-09 VITALS — BP 127/84 | HR 92 | Ht 63.0 in | Wt 280.6 lb

## 2023-06-09 VITALS — BP 127/84 | HR 92 | Wt 280.6 lb

## 2023-06-09 DIAGNOSIS — O24414 Gestational diabetes mellitus in pregnancy, insulin controlled: Secondary | ICD-10-CM

## 2023-06-09 DIAGNOSIS — O99213 Obesity complicating pregnancy, third trimester: Secondary | ICD-10-CM | POA: Diagnosis not present

## 2023-06-09 DIAGNOSIS — Z3A37 37 weeks gestation of pregnancy: Secondary | ICD-10-CM

## 2023-06-09 DIAGNOSIS — O403XX0 Polyhydramnios, third trimester, not applicable or unspecified: Secondary | ICD-10-CM

## 2023-06-09 DIAGNOSIS — E669 Obesity, unspecified: Secondary | ICD-10-CM | POA: Diagnosis not present

## 2023-06-09 DIAGNOSIS — Z3480 Encounter for supervision of other normal pregnancy, unspecified trimester: Secondary | ICD-10-CM

## 2023-06-09 LAB — POCT URINALYSIS DIPSTICK OB
Bilirubin, UA: NEGATIVE
Blood, UA: NEGATIVE
Clarity, UA: NEGATIVE
Glucose, UA: NEGATIVE
Ketones, UA: NEGATIVE
Leukocytes, UA: NEGATIVE
Nitrite, UA: NEGATIVE
POC,PROTEIN,UA: NEGATIVE
Spec Grav, UA: <=1.005 — AB (ref 1.010–1.025)
Urobilinogen, UA: 0.2 E.U./dL
pH, UA: 7 (ref 5.0–8.0)

## 2023-06-09 NOTE — Assessment & Plan Note (Addendum)
-  IOL scheduled for 8/29. Fetal position confirmed to be transverse (head to maternal right) via BSUS which is the same position baby was noted to be in on 8/21 at MFM ultrasound. Discussed options and she desires an ECV. Dr. Valentino Saxon (physician on call on 8/29) notified. Will plan for ECV that morning with IOL if successful and CS if unsuccessful.   -Reviewed labor warning signs and expectations for birth. Instructed to call office or come to hospital with persistent headache, vision changes, regular contractions, leaking of fluid, decreased fetal movement or vaginal bleeding.

## 2023-06-09 NOTE — Progress Notes (Signed)
    Return Prenatal Note   Assessment/Plan   Plan  32 y.o. G2P1001 at [redacted]w[redacted]d presents for follow-up OB visit. Reviewed prenatal record including previous visit note.  Supervision of other normal pregnancy, antepartum -IOL scheduled for 8/29. Fetal position confirmed to be transverse (head to maternal right) via BSUS which is the same position baby was noted to be in on 8/21 at MFM ultrasound. Discussed options and she desires an ECV. Dr. Valentino Saxon (physician on call on 8/29) notified. Will plan for ECV that morning with IOL if successful and CS if unsuccessful.   -Reviewed labor warning signs and expectations for birth. Instructed to call office or come to hospital with persistent headache, vision changes, regular contractions, leaking of fluid, decreased fetal movement or vaginal bleeding.  Gestational diabetes -Has not been checking blood sugars consistently since last this past Friday because she had a wedding this week and her arm monitor was not working correctly. The blood sugars that she did check were all WNL, FBG all less than 95 and 2 hour PP all less than 112.   -RNST in clinic today.     Orders Placed This Encounter  Procedures   POC Urinalysis Dipstick OB   Return for Already scheduled appointment.   Future Appointments  Date Time Provider Department Center  06/09/2023 10:00 AM [facility] CON[facility] None    For next visit:   IOL on 8/29     Subjective   32 y.o. G2P1001 at [redacted]w[redacted]d presents for this follow-up prenatal visit.  Patient has no concerns. Patient reports: Movement: Present Contractions: Irritability  Objective   Flow sheet Vitals: Pulse Rate: 92 BP: 127/84 Fetal Heart Rate (bpm): RNST Presentation: Transverse Total weight gain: 5 lb 9.6 oz (2.54 kg)  General Appearance  No acute distress, well appearing, and well nourished Pulmonary   Normal work of breathing Neurologic   Alert and oriented to person, place, and time Psychiatric   Mood and  affect within normal limits  Hannah Stevens, CNM  08/26/249:29 AM

## 2023-06-09 NOTE — Consult Note (Signed)
[facility] Anesthesia Consultation  Hannah Stevens WGN:[id number] DOB: 1991/10/10 DOA: 06/09/2023 PCP: Carlean Jews, PA-C   Requesting physician: Burney Gauze, CNM Date of consultation: 06/09/2023 Reason for consultation: Obesity during pregnancy  CHIEF COMPLAINT:  Obesity during pregnancy  HISTORY OF PRESENT ILLNESS: Hannah Stevens  is a G1P1 32 y.o. female with a known history of morbid obesity, new diagnosis of gestational diabetes on insulin, presenting for re-consultation prior to scheduled induction.   Patient notes only health change has been the diagnosis of GDM, for which she is on insulin. Her BMI has only slightly increased since her last consultation with [place] 3 months ago (currently at 49.71).  PAST MEDICAL HISTORY:   Past Medical History:  Diagnosis Date   Abnormal weight gain 03/12/2018   Acute upper respiratory infection 09/13/2020   Anxiety    Dysuria 01/17/2020   Endometriosis 11/16/2018   External hemorrhoid 07/05/2019   Generalized abdominal pain 10/19/2018   Genetic screening 12/15/2020   Impaired fasting glucose 03/12/2018   Other constipation 10/19/2018   Other fatigue 03/12/2018   Ovarian cyst    Pelvic pain    Vaccine for human papilloma virus (HPV) types 6, 11, 16, and 18 administered    Vitamin D deficiency 03/12/2018    PAST SURGICAL HISTORY:  Past Surgical History:  Procedure Laterality Date   TONSILLECTOMY     WISDOM TOOTH EXTRACTION      SOCIAL HISTORY:  Social History   Tobacco Use   Smoking status: Former    Current packs/day: 0.00    Types: Cigarettes    Start date: 10/14/2013    Quit date: 10/14/2014    Years since quitting: 8.6   Smokeless tobacco: Never  Substance Use Topics   Alcohol use: Not Currently    Alcohol/week: 0.0 standard drinks of alcohol    Comment: occasionally    FAMILY HISTORY:  Family History  Problem Relation Age of Onset   Healthy Mother    Diabetes Father     Healthy Brother    Healthy Brother    Healthy Brother    Healthy Maternal Grandmother    Healthy Maternal Grandfather    Healthy Paternal Grandmother    Cirrhosis Paternal Grandfather        liver    DRUG ALLERGIES: No Known Allergies  REVIEW OF SYSTEMS:   RESPIRATORY: No cough, shortness of breath, wheezing.  CARDIOVASCULAR: No chest pain, orthopnea, edema.  HEMATOLOGY: No anemia, easy bruising or bleeding SKIN: No rash or lesion. NEUROLOGIC: No tingling, numbness, weakness.  PSYCHIATRY: No anxiety or depression.   MEDICATIONS AT HOME:  Prior to Admission medications   Medication Sig Start Date End Date Taking? Authorizing Provider  aspirin EC 81 MG tablet Take 81 mg by mouth daily. Swallow whole.    [provider]  Blood Glucose Monitoring Suppl DEVI 1 each by Does not apply route in the morning, at noon, and at bedtime. May substitute to any manufacturer covered by patient's insurance. 05/06/23   Free, Lindalou Hose, CNM  citalopram (CELEXA) 10 MG tablet Take 1 tablet (10 mg total) by mouth daily. 12/20/22   Allie Bossier, MD  Elastic Bandages & Supports (ABDOMINAL BINDER/ELASTIC XL) MISC 1 Units/day by Does not apply route daily. Patient not taking: Reported on 05/28/2023 05/06/23   Free, Lindalou Hose, CNM  insulin NPH Human (NOVOLIN N) 100 UNIT/ML injection Inject 0.06 mLs (6 Units total) into the skin at bedtime. 05/28/23   Linzie Collin, MD  polycarbophil (FIBERCON) 625 MG tablet Take 625 mg by mouth daily.    [provider]  prenatal vitamin w/FE, FA (NATACHEW) 29-1 MG CHEW chewable tablet Chew 2 tablets by mouth daily at 12 noon.    [provider]      PHYSICAL EXAMINATION:   VITAL SIGNS: Last menstrual period 09/27/2022, currently breastfeeding.  GENERAL:  31 y.o.-year-old patient no acute distress.  HEENT: Head atraumatic, normocephalic. Oropharynx and nasopharynx clear. MP 2, TM distance >3 cm, normal mouth opening. LUNGS: No use of accessory  muscles of respiration.   EXTREMITIES: No pedal edema, cyanosis, or clubbing.  NEUROLOGIC: normal gait PSYCHIATRIC: The patient is alert and oriented x 3.  SKIN: No obvious rash, lesion, or ulcer.    IMPRESSION AND PLAN:   Hannah Stevens  is a 32 y.o. female presenting with obesity during pregnancy. BMI is currently 49.71 at [redacted]w[redacted]d gestation.   We discussed analgesic options during labor including epidural analgesia. Discussed that in obesity there can be increased difficulty with epidural placement or even failure of successful epidural. We also discussed that even after successful epidural placement there is increased risk of catheter migration out of the epidural space that would require catheter replacement. Discussed use of epidural vs spinal vs GA if cesarean delivery is required. Discussed increased risk of difficult intubation during pregnancy should an emergency cesarean delivery be required.   This patient is appropriate for anesthetic care at [facility] in [place]. We discussed the limitations of a community hospital including but not limited to staffing, imaging, medications, and blood products. The patient is aware of these limitations. All questions answered and concerns addressed.

## 2023-06-09 NOTE — Assessment & Plan Note (Signed)
-  Has not been checking blood sugars consistently since last this past Friday because she had a wedding this week and her arm monitor was not working correctly. The blood sugars that she did check were all WNL, FBG all less than 95 and 2 hour PP all less than 112.   -RNST in clinic today.

## 2023-06-10 ENCOUNTER — Other Ambulatory Visit: Payer: BC Managed Care – PPO

## 2023-06-11 ENCOUNTER — Other Ambulatory Visit: Payer: Self-pay | Admitting: Obstetrics and Gynecology

## 2023-06-12 ENCOUNTER — Encounter
Admission: EM | Disposition: A | Discharge status: Home / Self Care | Payer: Self-pay | Source: Home / Self Care | Attending: Obstetrics

## 2023-06-12 ENCOUNTER — Other Ambulatory Visit: Payer: Self-pay

## 2023-06-12 ENCOUNTER — Inpatient Hospital Stay
Admission: EM | Admit: 2023-06-12 | Discharge: 2023-06-14 | DRG: 785 | Disposition: A | Discharge status: Home / Self Care | Payer: BC Managed Care – PPO | Attending: Obstetrics | Admitting: Obstetrics

## 2023-06-12 ENCOUNTER — Encounter: Payer: Self-pay | Admitting: Certified Nurse Midwife

## 2023-06-12 ENCOUNTER — Inpatient Hospital Stay: Payer: BC Managed Care – PPO | Admitting: Registered Nurse

## 2023-06-12 DIAGNOSIS — O329XX0 Maternal care for malpresentation of fetus, unspecified, not applicable or unspecified: Secondary | ICD-10-CM | POA: Diagnosis present

## 2023-06-12 DIAGNOSIS — O9081 Anemia of the puerperium: Secondary | ICD-10-CM | POA: Insufficient documentation

## 2023-06-12 DIAGNOSIS — D62 Acute posthemorrhagic anemia: Secondary | ICD-10-CM

## 2023-06-12 DIAGNOSIS — O320XX0 Maternal care for unstable lie, not applicable or unspecified: Secondary | ICD-10-CM

## 2023-06-12 DIAGNOSIS — O99214 Obesity complicating childbirth: Secondary | ICD-10-CM | POA: Diagnosis not present

## 2023-06-12 DIAGNOSIS — O24414 Gestational diabetes mellitus in pregnancy, insulin controlled: Secondary | ICD-10-CM

## 2023-06-12 DIAGNOSIS — O99213 Obesity complicating pregnancy, third trimester: Secondary | ICD-10-CM | POA: Diagnosis present

## 2023-06-12 DIAGNOSIS — O403XX0 Polyhydramnios, third trimester, not applicable or unspecified: Principal | ICD-10-CM | POA: Diagnosis present

## 2023-06-12 DIAGNOSIS — F419 Anxiety disorder, unspecified: Secondary | ICD-10-CM | POA: Diagnosis not present

## 2023-06-12 DIAGNOSIS — O99344 Other mental disorders complicating childbirth: Secondary | ICD-10-CM | POA: Diagnosis present

## 2023-06-12 DIAGNOSIS — Z302 Encounter for sterilization: Secondary | ICD-10-CM

## 2023-06-12 DIAGNOSIS — O0993 Supervision of high risk pregnancy, unspecified, third trimester: Secondary | ICD-10-CM

## 2023-06-12 DIAGNOSIS — E669 Obesity, unspecified: Secondary | ICD-10-CM | POA: Diagnosis not present

## 2023-06-12 DIAGNOSIS — Z87891 Personal history of nicotine dependence: Secondary | ICD-10-CM

## 2023-06-12 DIAGNOSIS — O321XX0 Maternal care for breech presentation, not applicable or unspecified: Secondary | ICD-10-CM | POA: Diagnosis not present

## 2023-06-12 DIAGNOSIS — O34211 Maternal care for low transverse scar from previous cesarean delivery: Secondary | ICD-10-CM | POA: Diagnosis not present

## 2023-06-12 DIAGNOSIS — Z4682 Encounter for fitting and adjustment of non-vascular catheter: Secondary | ICD-10-CM | POA: Diagnosis not present

## 2023-06-12 DIAGNOSIS — O0990 Supervision of high risk pregnancy, unspecified, unspecified trimester: Secondary | ICD-10-CM

## 2023-06-12 DIAGNOSIS — Z23 Encounter for immunization: Secondary | ICD-10-CM | POA: Diagnosis not present

## 2023-06-12 DIAGNOSIS — Z051 Observation and evaluation of newborn for suspected infectious condition ruled out: Secondary | ICD-10-CM | POA: Diagnosis not present

## 2023-06-12 DIAGNOSIS — O24424 Gestational diabetes mellitus in childbirth, insulin controlled: Secondary | ICD-10-CM | POA: Diagnosis present

## 2023-06-12 DIAGNOSIS — Z3480 Encounter for supervision of other normal pregnancy, unspecified trimester: Principal | ICD-10-CM

## 2023-06-12 DIAGNOSIS — Z3A37 37 weeks gestation of pregnancy: Secondary | ICD-10-CM

## 2023-06-12 DIAGNOSIS — O9903 Anemia complicating the puerperium: Secondary | ICD-10-CM | POA: Diagnosis not present

## 2023-06-12 DIAGNOSIS — Z3202 Encounter for pregnancy test, result negative: Secondary | ICD-10-CM

## 2023-06-12 HISTORY — DX: Gestational diabetes mellitus in pregnancy, unspecified control: O24.419

## 2023-06-12 LAB — CBC
HCT: 32.7 % — ABNORMAL LOW (ref 36.0–46.0)
Hemoglobin: 11.3 g/dL — ABNORMAL LOW (ref 12.0–15.0)
MCH: 28.8 pg (ref 26.0–34.0)
MCHC: 34.6 g/dL (ref 30.0–36.0)
MCV: 83.2 fL (ref 80.0–100.0)
Platelets: 213 10*3/uL (ref 150–400)
RBC: 3.93 MIL/uL (ref 3.87–5.11)
RDW: 12.4 % (ref 11.5–15.5)
WBC: 7.3 10*3/uL (ref 4.0–10.5)
nRBC: 0 % (ref 0.0–0.2)

## 2023-06-12 LAB — TYPE AND SCREEN
ABO/RH(D): O POS
Antibody Screen: NEGATIVE

## 2023-06-12 LAB — GLUCOSE, CAPILLARY
Glucose-Capillary: 97 mg/dL (ref 70–99)
Glucose-Capillary: 95 mg/dL (ref 70–99)
Glucose-Capillary: 121 mg/dL — ABNORMAL HIGH (ref 70–99)

## 2023-06-12 LAB — GLUCOSE, RANDOM: Glucose, Bld: 96 mg/dL (ref 70–99)

## 2023-06-12 LAB — RPR: RPR Ser Ql: NONREACTIVE

## 2023-06-12 SURGERY — Surgical Case
Anesthesia: Spinal

## 2023-06-12 MED ORDER — CEFAZOLIN IN SODIUM CHLORIDE 3-0.9 GM/100ML-% IV SOLN
3.0000 g | INTRAVENOUS | Status: AC
  Administered 2023-06-12: 3 g via INTRAVENOUS
  Filled 2023-06-12: qty 100

## 2023-06-12 MED ORDER — LIDOCAINE 5 % EX PTCH
1.0000 | MEDICATED_PATCH | CUTANEOUS | Status: DC
  Filled 2023-06-12: qty 1

## 2023-06-12 MED ORDER — PHENYLEPHRINE HCL-NACL 20-0.9 MG/250ML-% IV SOLN
INTRAVENOUS | Status: DC | PRN
  Administered 2023-06-12: 40 ug/min via INTRAVENOUS

## 2023-06-12 MED ORDER — DIPHENHYDRAMINE HCL 25 MG PO CAPS
25.0000 mg | ORAL_CAPSULE | ORAL | Status: DC | PRN
  Administered 2023-06-12: 25 mg via ORAL

## 2023-06-12 MED ORDER — CITALOPRAM HYDROBROMIDE 10 MG PO TABS
10.0000 mg | ORAL_TABLET | Freq: Every day | ORAL | Status: DC
  Administered 2023-06-13 – 2023-06-14 (×2): 10 mg via ORAL
  Filled 2023-06-12 (×2): qty 1

## 2023-06-12 MED ORDER — PHENYLEPHRINE HCL-NACL 20-0.9 MG/250ML-% IV SOLN
INTRAVENOUS | Status: AC
  Filled 2023-06-12: qty 250

## 2023-06-12 MED ORDER — LIDOCAINE 5 % EX PTCH
MEDICATED_PATCH | CUTANEOUS | Status: AC
  Filled 2023-06-12: qty 1

## 2023-06-12 MED ORDER — ACETAMINOPHEN 500 MG PO TABS
1000.0000 mg | ORAL_TABLET | Freq: Four times a day (QID) | ORAL | Status: DC
  Administered 2023-06-12: 1000 mg via ORAL
  Filled 2023-06-12: qty 2

## 2023-06-12 MED ORDER — OXYTOCIN-SODIUM CHLORIDE 30-0.9 UT/500ML-% IV SOLN
INTRAVENOUS | Status: DC | PRN
  Administered 2023-06-12: 250 mL/h via INTRAVENOUS

## 2023-06-12 MED ORDER — NALOXONE HCL 4 MG/10ML IJ SOLN: 1.0000 ug/kg/h | INTRAVENOUS | Status: DC | PRN

## 2023-06-12 MED ORDER — CEFAZOLIN SODIUM-DEXTROSE 2-4 GM/100ML-% IV SOLN
INTRAVENOUS | Status: AC
  Filled 2023-06-12: qty 100

## 2023-06-12 MED ORDER — DIPHENHYDRAMINE HCL 50 MG/ML IJ SOLN: 12.5000 mg | INTRAMUSCULAR | Status: DC | PRN

## 2023-06-12 MED ORDER — ZOLPIDEM TARTRATE 5 MG PO TABS: 5.0000 mg | ORAL_TABLET | Freq: Every evening | ORAL | Status: DC | PRN

## 2023-06-12 MED ORDER — ONDANSETRON HCL 4 MG/2ML IJ SOLN: 4.0000 mg | Freq: Three times a day (TID) | INTRAMUSCULAR | Status: DC | PRN

## 2023-06-12 MED ORDER — MISOPROSTOL 50MCG HALF TABLET: 50.0000 ug | ORAL_TABLET | Freq: Once | ORAL | Status: DC

## 2023-06-12 MED ORDER — DEXAMETHASONE SODIUM PHOSPHATE 10 MG/ML IJ SOLN
INTRAMUSCULAR | Status: AC
  Filled 2023-06-12: qty 1

## 2023-06-12 MED ORDER — KETOROLAC TROMETHAMINE 30 MG/ML IJ SOLN: 30.0000 mg | Freq: Four times a day (QID) | INTRAMUSCULAR | Status: DC | PRN

## 2023-06-12 MED ORDER — KETOROLAC TROMETHAMINE 30 MG/ML IJ SOLN
30.0000 mg | Freq: Four times a day (QID) | INTRAMUSCULAR | Status: DC | PRN
  Administered 2023-06-12: 30 mg via INTRAVENOUS
  Filled 2023-06-12: qty 1

## 2023-06-12 MED ORDER — IBUPROFEN 600 MG PO TABS
600.0000 mg | ORAL_TABLET | Freq: Four times a day (QID) | ORAL | Status: DC
  Administered 2023-06-14 (×2): 600 mg via ORAL
  Filled 2023-06-12 (×2): qty 1

## 2023-06-12 MED ORDER — SOD CITRATE-CITRIC ACID 500-334 MG/5ML PO SOLN: 30.0000 mL | ORAL | Status: DC | PRN

## 2023-06-12 MED ORDER — OXYTOCIN BOLUS FROM INFUSION: 333.0000 mL | Freq: Once | INTRAVENOUS | Status: DC

## 2023-06-12 MED ORDER — BUPIVACAINE IN DEXTROSE 0.75-8.25 % IT SOLN
INTRATHECAL | Status: DC | PRN
  Administered 2023-06-12: 1.6 mL via INTRATHECAL

## 2023-06-12 MED ORDER — OXYTOCIN-SODIUM CHLORIDE 30-0.9 UT/500ML-% IV SOLN: 2.5000 [IU]/h | INTRAVENOUS | Status: AC

## 2023-06-12 MED ORDER — SCOPOLAMINE 1 MG/3DAYS TD PT72
1.0000 | MEDICATED_PATCH | Freq: Once | TRANSDERMAL | Status: DC
  Administered 2023-06-12: 1.5 mg via TRANSDERMAL
  Filled 2023-06-12: qty 1

## 2023-06-12 MED ORDER — EPHEDRINE SULFATE (PRESSORS) 50 MG/ML IJ SOLN
INTRAMUSCULAR | Status: DC | PRN
  Administered 2023-06-12: 15 mg via INTRAVENOUS
  Administered 2023-06-12 (×2): 10 mg via INTRAVENOUS

## 2023-06-12 MED ORDER — LACTATED RINGERS IV BOLUS
1000.0000 mL | Freq: Once | INTRAVENOUS | Status: AC
  Administered 2023-06-12: 1000 mL via INTRAVENOUS

## 2023-06-12 MED ORDER — PHENYLEPHRINE 80 MCG/ML (10ML) SYRINGE FOR IV PUSH (FOR BLOOD PRESSURE SUPPORT)
PREFILLED_SYRINGE | INTRAVENOUS | Status: DC | PRN
  Administered 2023-06-12: 80 ug via INTRAVENOUS
  Administered 2023-06-12: 160 ug via INTRAVENOUS

## 2023-06-12 MED ORDER — 0.9 % SODIUM CHLORIDE (POUR BTL) OPTIME
TOPICAL | Status: DC | PRN
  Administered 2023-06-12: 300 mL

## 2023-06-12 MED ORDER — LIDOCAINE HCL (PF) 1 % IJ SOLN: 30.0000 mL | INTRAMUSCULAR | Status: DC | PRN

## 2023-06-12 MED ORDER — ONDANSETRON HCL 4 MG/2ML IJ SOLN
INTRAMUSCULAR | Status: AC
  Filled 2023-06-12: qty 2

## 2023-06-12 MED ORDER — KETOROLAC TROMETHAMINE 30 MG/ML IJ SOLN
30.0000 mg | Freq: Four times a day (QID) | INTRAMUSCULAR | Status: AC
  Administered 2023-06-13 (×3): 30 mg via INTRAVENOUS
  Filled 2023-06-12 (×3): qty 1

## 2023-06-12 MED ORDER — TERBUTALINE SULFATE 1 MG/ML IJ SOLN: 0.2500 mg | Freq: Once | INTRAMUSCULAR | Status: DC | PRN

## 2023-06-12 MED ORDER — DEXAMETHASONE SODIUM PHOSPHATE 10 MG/ML IJ SOLN
INTRAMUSCULAR | Status: DC | PRN
  Administered 2023-06-12: 10 mg via INTRAVENOUS

## 2023-06-12 MED ORDER — ONDANSETRON HCL 4 MG/2ML IJ SOLN
INTRAMUSCULAR | Status: DC | PRN
Start: 2023-06-12 — End: 2023-06-12
  Administered 2023-06-12: 4 mg via INTRAVENOUS

## 2023-06-12 MED ORDER — MENTHOL 3 MG MT LOZG: 1.0000 | LOZENGE | OROMUCOSAL | Status: DC | PRN

## 2023-06-12 MED ORDER — MORPHINE SULFATE (PF) 0.5 MG/ML IJ SOLN
INTRAMUSCULAR | Status: AC
  Filled 2023-06-12: qty 10

## 2023-06-12 MED ORDER — TRAMADOL HCL 50 MG PO TABS: 50.0000 mg | ORAL_TABLET | Freq: Four times a day (QID) | ORAL | Status: DC | PRN

## 2023-06-12 MED ORDER — OXYCODONE HCL 5 MG PO TABS: 5.0000 mg | ORAL_TABLET | Freq: Four times a day (QID) | ORAL | Status: DC | PRN

## 2023-06-12 MED ORDER — ACETAMINOPHEN 325 MG PO TABS: 650.0000 mg | ORAL_TABLET | ORAL | Status: DC | PRN

## 2023-06-12 MED ORDER — FENTANYL CITRATE (PF) 100 MCG/2ML IJ SOLN
50.0000 ug | INTRAMUSCULAR | Status: DC | PRN
  Administered 2023-06-12: 50 ug via INTRAVENOUS
  Filled 2023-06-12: qty 2

## 2023-06-12 MED ORDER — SODIUM CHLORIDE 0.9 % IV SOLN
INTRAVENOUS | Status: AC
  Filled 2023-06-12: qty 5

## 2023-06-12 MED ORDER — PRENATAL MULTIVITAMIN CH
1.0000 | ORAL_TABLET | Freq: Every day | ORAL | Status: DC
  Administered 2023-06-13: 1 via ORAL
  Filled 2023-06-12: qty 1

## 2023-06-12 MED ORDER — SENNOSIDES-DOCUSATE SODIUM 8.6-50 MG PO TABS
2.0000 | ORAL_TABLET | Freq: Every day | ORAL | Status: DC
  Administered 2023-06-13 – 2023-06-14 (×2): 2 via ORAL
  Filled 2023-06-12 (×2): qty 2

## 2023-06-12 MED ORDER — LACTATED RINGERS IV SOLN: INTRAVENOUS | Status: DC

## 2023-06-12 MED ORDER — SODIUM CHLORIDE 0.9% FLUSH: 3.0000 mL | INTRAVENOUS | Status: DC | PRN

## 2023-06-12 MED ORDER — TERBUTALINE SULFATE 1 MG/ML IJ SOLN
0.2500 mg | Freq: Once | INTRAMUSCULAR | Status: AC
  Administered 2023-06-12: 0.25 mg via SUBCUTANEOUS
  Filled 2023-06-12: qty 1

## 2023-06-12 MED ORDER — OXYCODONE HCL 5 MG PO TABS
5.0000 mg | ORAL_TABLET | ORAL | Status: DC | PRN
  Administered 2023-06-14: 5 mg via ORAL
  Administered 2023-06-14: 10 mg via ORAL
  Filled 2023-06-12 (×3): qty 1

## 2023-06-12 MED ORDER — DIPHENHYDRAMINE HCL 25 MG PO CAPS
25.0000 mg | ORAL_CAPSULE | Freq: Four times a day (QID) | ORAL | Status: DC | PRN
  Filled 2023-06-12: qty 1

## 2023-06-12 MED ORDER — SIMETHICONE 80 MG PO CHEW
80.0000 mg | CHEWABLE_TABLET | ORAL | Status: DC | PRN
  Administered 2023-06-13: 80 mg via ORAL

## 2023-06-12 MED ORDER — GABAPENTIN 100 MG PO CAPS
100.0000 mg | ORAL_CAPSULE | Freq: Three times a day (TID) | ORAL | Status: DC
  Administered 2023-06-12 – 2023-06-14 (×5): 100 mg via ORAL
  Filled 2023-06-12 (×5): qty 1

## 2023-06-12 MED ORDER — WITCH HAZEL-GLYCERIN EX PADS: 1.0000 | MEDICATED_PAD | CUTANEOUS | Status: DC | PRN

## 2023-06-12 MED ORDER — METOCLOPRAMIDE HCL 5 MG/ML IJ SOLN
INTRAMUSCULAR | Status: DC | PRN
Start: 2023-06-12 — End: 2023-06-12
  Administered 2023-06-12: 10 mg via INTRAVENOUS

## 2023-06-12 MED ORDER — LIDOCAINE HCL (PF) 1 % IJ SOLN
INTRAMUSCULAR | Status: DC | PRN
  Administered 2023-06-12: 3 mL via SUBCUTANEOUS

## 2023-06-12 MED ORDER — MINERAL OIL PO OIL
TOPICAL_OIL | Freq: Once | ORAL | Status: AC
  Filled 2023-06-12: qty 30

## 2023-06-12 MED ORDER — CHLORHEXIDINE GLUCONATE 0.12 % MT SOLN
OROMUCOSAL | Status: AC
  Administered 2023-06-12: 15 mL
  Filled 2023-06-12: qty 15

## 2023-06-12 MED ORDER — SIMETHICONE 80 MG PO CHEW
80.0000 mg | CHEWABLE_TABLET | Freq: Three times a day (TID) | ORAL | Status: DC
  Administered 2023-06-13 – 2023-06-14 (×3): 80 mg via ORAL
  Filled 2023-06-12 (×4): qty 1

## 2023-06-12 MED ORDER — SOD CITRATE-CITRIC ACID 500-334 MG/5ML PO SOLN
ORAL | Status: AC
  Administered 2023-06-12: 30 mL via ORAL
  Filled 2023-06-12: qty 15

## 2023-06-12 MED ORDER — LIDOCAINE 5 % EX PTCH
MEDICATED_PATCH | CUTANEOUS | Status: DC | PRN
  Administered 2023-06-12: 1 via TRANSDERMAL

## 2023-06-12 MED ORDER — FENTANYL CITRATE (PF) 100 MCG/2ML IJ SOLN
INTRAMUSCULAR | Status: DC | PRN
  Administered 2023-06-12: 10 ug via INTRATHECAL

## 2023-06-12 MED ORDER — FENTANYL CITRATE (PF) 100 MCG/2ML IJ SOLN
INTRAMUSCULAR | Status: AC
  Filled 2023-06-12: qty 2

## 2023-06-12 MED ORDER — COCONUT OIL OIL
1.0000 | TOPICAL_OIL | Status: DC | PRN
  Filled 2023-06-12: qty 7.5

## 2023-06-12 MED ORDER — OXYTOCIN-SODIUM CHLORIDE 30-0.9 UT/500ML-% IV SOLN
2.5000 [IU]/h | INTRAVENOUS | Status: DC
  Filled 2023-06-12: qty 500

## 2023-06-12 MED ORDER — MORPHINE SULFATE (PF) 0.5 MG/ML IJ SOLN
INTRAMUSCULAR | Status: DC | PRN
  Administered 2023-06-12: 200 ug via INTRATHECAL

## 2023-06-12 MED ORDER — LACTATED RINGERS IV SOLN: 500.0000 mL | INTRAVENOUS | Status: DC | PRN

## 2023-06-12 MED ORDER — DIBUCAINE (PERIANAL) 1 % EX OINT: 1.0000 | TOPICAL_OINTMENT | CUTANEOUS | Status: DC | PRN

## 2023-06-12 MED ORDER — ACETAMINOPHEN 500 MG PO TABS
1000.0000 mg | ORAL_TABLET | Freq: Four times a day (QID) | ORAL | Status: DC
  Administered 2023-06-13 – 2023-06-14 (×6): 1000 mg via ORAL
  Filled 2023-06-12 (×6): qty 2

## 2023-06-12 MED ORDER — ONDANSETRON HCL 4 MG/2ML IJ SOLN: 4.0000 mg | Freq: Four times a day (QID) | INTRAMUSCULAR | Status: DC | PRN

## 2023-06-12 MED ORDER — NALOXONE HCL 0.4 MG/ML IJ SOLN: 0.4000 mg | INTRAMUSCULAR | Status: DC | PRN

## 2023-06-12 SURGICAL SUPPLY — 30 items
APL PRP STRL LF DISP 70% ISPRP (MISCELLANEOUS) ×2
BAG COUNTER SPONGE SURGICOUNT (BAG) ×1
BAG SPNG CNTER NS LX DISP (BAG) ×1
BNDG TENSOPLAST 6X5 (GAUZE/BANDAGES/DRESSINGS)
CHLORAPREP W/TINT 26 (MISCELLANEOUS) ×2
DRSG TELFA 3X8 NADH STRL (GAUZE/BANDAGES/DRESSINGS) ×1
ELECT REM PT RETURN 9FT ADLT (ELECTROSURGICAL) ×1
EXTRT SYSTEM ALEXIS 17CM (MISCELLANEOUS)
GAUZE CURAFIL 4X4 (GAUZE/BANDAGES/DRESSINGS)
GAUZE SPONGE 4X4 12PLY STRL (GAUZE/BANDAGES/DRESSINGS) ×1
GLOVE BIO SURGEON STRL SZ 6.5 (GLOVE) ×1
GLOVE INDICATOR 7.0 STRL GRN (GLOVE) ×1
GOWN STRL REUS W/ TWL LRG LVL3 (GOWN DISPOSABLE) ×2
GOWN STRL REUS W/TWL LRG LVL3 (GOWN DISPOSABLE) ×2
KIT TURNOVER KIT A (KITS) ×1
MANIFOLD NEPTUNE II (INSTRUMENTS) ×1
MAT PREVALON FULL STRYKER (MISCELLANEOUS) ×1
NS IRRIG 1000ML POUR BTL (IV SOLUTION) ×1
PACK C SECTION AR (MISCELLANEOUS) ×1
PAD OB MATERNITY 4.3X12.25 (PERSONAL CARE ITEMS) ×1
PAD PREP OB/GYN DISP 24X41 (PERSONAL CARE ITEMS) ×1
SCRUB CHG 4% DYNA-HEX 4OZ (MISCELLANEOUS) ×1
SUT CHROMIC 2 0 CT 1 (SUTURE) ×2
SUT MNCRL AB 4-0 PS2 18 (SUTURE) ×1
SUT VIC AB 0 CT1 36 (SUTURE) ×4
SUT VIC AB 3-0 SH 27 (SUTURE) ×1
SUT VIC AB 3-0 SH 27X BRD (SUTURE) ×1
SUT VICRYL 3-0 27IN (SUTURE) ×1
TRAP FLUID SMOKE EVACUATOR (MISCELLANEOUS) ×1
WATER STERILE IRR 500ML POUR (IV SOLUTION) ×1

## 2023-06-12 NOTE — Procedures (Signed)
EXTERNAL CEPHALIC VERSION Procedure Note   Hannah Stevens female 32 y.o. 06/12/2023    Indications: The patient is a 32 y.o. G7P1001 female at [redacted]w[redacted]d with fetal malpresentation  breech). Previously transverse presentation (head to maternal right) 1 week prior on ultrasound. Pregnancy also complicated by polyhydramnios (AFI 31.45 cm), obesity in pregnancy, GDM Class A2, LGA infant (92%ile) The patient has been previously counseled on risks of the procedure including uterine contractions with progression to labor, fetal distress, rupture of membranes, failure of procedure requiring Cesarean delivery, vaginal bleeding, abruption, and fetal death. Consent form for procedure has been signed.   Pre-operative Diagnosis: A [redacted]w[redacted]d week intrauterine pregnancy in breech presentation on today's bedside imaging. Previously transverse presentation (head to maternal right) 1 week prior on ultrasound. Polyhydramnios (AFI 31 cm), obesity in pregnancy, GDM Class A2, LGA infant (92%ile).    Post-operative Diagnosis: A [redacted]w[redacted]d intrauterine pregnancy in vertex presentation, status post successful external cephalic version.   Surgeon: Hildred Laser, MD   Assistants: Paula Compton, CNM. An experienced assistant was required for fetal manipulation, and retraction.    Anesthesia: IV analgesia 50 mcg of Fentanyl.   Procedure Details:  The patient was brought to Labor and Delivery where a reactive fetal heart tracing was obtained. The patient was not noted to have any uterine contractions. She was given 1 dose of subcutaneous terbutaline  for uterine relaxation and a 1L IVF bolus. A bedside ultrasound was performed which revealed single intrauterine pregnancy and breech presentation, head to maternal left. There was noted to be copious fluid.  Copious mineral oil was used for abdominal lubrication. Using manual pressure, the breech was manipulated in a forward roll fashion however unable to successfully vert the fetus. Fetal  heart tones were checked intermittently during the procedure and were noted to be reassuring. She was previously scheduled for IOL today for likely uncontrolled GDM, recommended by MFM. However due to fetal presentation, will now proceed with cesarean delivery. Patient also desires tubal ligation for permanent sterilization.     Complications: None     Cesarean Section Counseling  The risks of surgery were discussed with the patient including but were not limited to: bleeding which may require transfusion or reoperation; infection which may require antibiotics; injury to bowel, bladder, ureters or other surrounding organs; injury to the fetus; need for additional procedures including hysterectomy in the event of a life-threatening hemorrhage; formation of adhesions; placental abnormalities with subsequent pregnancies; incisional problems; thromboembolic phenomenon and other postoperative/anesthesia complications.    Patient also desires permanent sterilization.  Other reversible forms of contraception were discussed with patient; she declines all other modalities. Risks of procedure discussed with patient including but not limited to: risk of regret, permanence of method, bleeding, infection, injury to surrounding organs and need for additional procedures.  Failure risk of about 1% with increased risk of ectopic gestation if pregnancy occurs was also discussed with patient.  Also discussed possibility of post-tubal pain syndrome. Patient verbalized understanding of these risks and wants to proceed with sterilization.    The patient concurred with the proposed plan, giving informed written consent for the procedure.   Patient has been NPO since midnight, she will remain NPO for procedure. Anesthesia and OR aware. Preoperative prophylactic antibiotics and SCDs ordered on call to the OR.  To OR when ready.   Hildred Laser, MD [facility]

## 2023-06-12 NOTE — Progress Notes (Signed)
Cherry MD and CNM at bedside for ECV. Pt consents obtained and premeds given.

## 2023-06-12 NOTE — Op Note (Addendum)
Cesarean Section Procedure Note  Indications: malpresentation: breech  Pre-operative Diagnosis: 37 week 6 day pregnancy, fetal malpresentation (unstable lie), Gestational diabetes (Stevens insulin, poorly controlled), obesity in pregnancy, polyhydramnios (AFI 31). Also with undesired fertility  Post-operative Diagnosis: Same  Surgeon: Hildred Laser, MD  Assistants: Raeford Razor, CNM  Procedure: Primary low transverse Cesarean Section with Bilateral Tubal Ligation  Anesthesia: Spinal anesthesia  Findings: Female infant, frank presentation, 7 lbs 6 oz, with Apgar scores of 8 at one minute and 9 at five minutes. Intact placenta with 3 vessel cord.  Copious clear amniotic fluid at amniotomy The uterine outline, tubes and ovaries appeared normal.   Procedure Details: The patient was seen in the Holding Room. The risks, benefits, complications, treatment options, and expected outcomes were discussed with the patient.  The patient concurred with the proposed plan, giving informed consent.  The site of surgery properly noted/marked. The patient was taken to the Operating Room, identified as Hannah Stevens and the procedure verified as C-Section Delivery. A Time Out was held and the above information confirmed.  After induction of anesthesia, the patient was draped and prepped in the usual sterile manner. Anesthesia was tested and noted to be adequate. A Pfannenstiel incision was made and carried down through the subcutaneous tissue to the fascia. Fascial incision was made and extended transversely. The fascia was separated from the underlying rectus tissue superiorly and inferiorly. The peritoneum was identified and entered. Peritoneal incision was extended longitudinally. The surgical assist was able to provide retraction to allow for clear visualization of surgical site. An Alexis retractor was placed in the abdomen for additional retraction. The utero-vesical peritoneal reflection was incised transversely  and the bladder flap was bluntly freed from the lower uterine segment. A low transverse uterine incision was made. Delivered from breech (frank) presentation was a 7 lb 6 oz Female with Apgar scores of 8 at one minute and 9 at five minutes.  The assistant was able to apply adequate fundal pressure to allow for successful delivery of the fetus. After the umbilical cord was clamped and cut, cord blood was obtained for evaluation. Delayed cord clamping was observed. The placenta was removed intact and appeared normal. The uterus was exteriorized and cleared of all clots and debris. The uterine outline, tubes and ovaries appeared normal.  The uterine incision was closed with running locked sutures of 0-Vicryl.  A second suture of 0-Vicryl was used in an imbricating layer.  A figure-of-eight suture was placed in the midline to achiee hemostasis. The bladder flap was noted to have oozing along the entire flap, so the bladder flap was approximated with a 3-0 VIcryl in a running fashion. Hemostasis was observed.   Attention was then turned to the fallopian tubes, and where the patient's right fallopian tube was identified and grasped with a Babcock clamp.  The tube was then followed out to the fimbria.  The Babcock clamp was then used to grasp the tube approximately 4 cm from the cornual region.  A 3 cm segment of tube was then ligated with a free tie of 0-Plain Gut using the [place] method and excised.  The left fallopian tube was then ligated in a similar fashion and excised. The tubal lumens were cauterized bilaterally.  Good hemostasis was noted with bilateral fallopian tubes.   The uterus was then returned to the abdomen.  The pericolic gutters were cleared of all clots and debris. The fascia was then reapproximated with a running suture of 0-Vicryl. The subcutaneous fat  layer was reapproximated with 2-0 Vicryl. The skin was reapproximated with 4-0 Monocryl.  The incision was covered with Dermabond.  A lidoderm  patch was placed over the incision.   Instrument, sponge, and needle counts were correct prior the abdominal closure and at the conclusion of the case.    An experienced assistant was required given the standard of surgical care given the complexity of the case.  This assistant was needed for exposure, dissection, suctioning, retraction, instrument exchange, and for overall help during the procedure.  Estimated Blood Loss:  776 ml      Drains: foley catheter to gravity drainage, 50 ml of clear urine at end of the procedure         Total IV Fluids:  700 ml  Specimens: Cord blood, sent for evaluation         Implants: None         Complications:  None; patient tolerated the procedure well.         Disposition: PACU - hemodynamically stable.          Condition: stable   Hildred Laser, MD [facility]

## 2023-06-12 NOTE — Anesthesia Preprocedure Evaluation (Signed)
Anesthesia Evaluation  Patient identified by MRN, date of birth, ID band Patient awake    Reviewed: Allergy & Precautions, H&P , NPO status , Patient's Chart, lab work & pertinent test results  History of Anesthesia Complications Negative for: history of anesthetic complications  Airway Mallampati: III  TM Distance: >3 FB Neck ROM: full    Dental no notable dental hx. (+) Chipped, Dental Advidsory Given   Pulmonary neg pulmonary ROS, former smoker   Pulmonary exam normal        Cardiovascular Exercise Tolerance: Good negative cardio ROS Normal cardiovascular exam Rhythm:regular Rate:Normal     Neuro/Psych   Anxiety     negative neurological ROS     GI/Hepatic negative GI ROS, Neg liver ROS,,,  Endo/Other  diabetes, Gestational  Morbid obesity  Renal/GU   negative genitourinary   Musculoskeletal   Abdominal   Peds  Hematology negative hematology ROS (+)   Anesthesia Other Findings Past Medical History: 03/12/2018: Abnormal weight gain 09/13/2020: Acute upper respiratory infection No date: Anxiety 01/17/2020: Dysuria 11/16/2018: Endometriosis 07/05/2019: External hemorrhoid 10/19/2018: Generalized abdominal pain 12/15/2020: Genetic screening No date: Gestational diabetes 03/12/2018: Impaired fasting glucose 10/19/2018: Other constipation 03/12/2018: Other fatigue No date: Ovarian cyst No date: Pelvic pain No date: Vaccine for human papilloma virus (HPV) types 6, 11, 16, and  18 administered 03/12/2018: Vitamin D deficiency  Past Surgical History: No date: TONSILLECTOMY No date: WISDOM TOOTH EXTRACTION  BMI    Body Mass Index: 49.42 kg/m      Reproductive/Obstetrics (+) Pregnancy                             Anesthesia Physical Anesthesia Plan  ASA: 3  Anesthesia Plan: Spinal   Post-op Pain Management:    Induction:   PONV Risk Score and Plan: 2 and Ondansetron  and Dexamethasone  Airway Management Planned: Natural Airway and Nasal Cannula  Additional Equipment:   Intra-op Plan:   Post-operative Plan:   Informed Consent: I have reviewed the patients History and Physical, chart, labs and discussed the procedure including the risks, benefits and alternatives for the proposed anesthesia with the patient or authorized representative who has indicated his/her understanding and acceptance.     Dental Advisory Given  Plan Discussed with: Anesthesiologist, CRNA and Surgeon  Anesthesia Plan Comments: (Patient reports no bleeding problems and no anticoagulant use.  Plan for spinal with backup GA  Patient consented for risks of anesthesia including but not limited to:  - adverse reactions to medications - damage to eyes, teeth, lips or other oral mucosa - nerve damage due to positioning  - risk of bleeding, infection and or nerve damage from spinal that could lead to paralysis - risk of headache or failed spinal - damage to teeth, lips or other oral mucosa - sore throat or hoarseness - damage to heart, brain, nerves, lungs, other parts of body or loss of life  Patient voiced understanding.)       Anesthesia Quick Evaluation

## 2023-06-12 NOTE — H&P (Signed)
Hannah Stevens is a 32 y.o. female presenting for  an attempt at external version followed by induction of labor, versus a primary Cesarean section. Her pregnancy has been marked by polyhydramnios, gestational diabetes, and a higher BMI. She has been followed with monthly growth scans, weekly NSTs and consultation with MFM, and her baby has been in an unstable lie. Admitted this morning for attempt at external version. OB History     Gravida  2   Para  1   Term  1   Preterm  0   AB  0   Living  1      SAB  0   IAB  0   Ectopic  0   Multiple  0   Live Births  1        Obstetric Comments  1st Menstrual Cycle: 9  1st Pregnancy: N/A         Past Medical History:  Diagnosis Date   Abnormal weight gain 03/12/2018   Acute upper respiratory infection 09/13/2020   Anxiety    Dysuria 01/17/2020   Endometriosis 11/16/2018   External hemorrhoid 07/05/2019   Generalized abdominal pain 10/19/2018   Genetic screening 12/15/2020   Gestational diabetes    Impaired fasting glucose 03/12/2018   Other constipation 10/19/2018   Other fatigue 03/12/2018   Ovarian cyst    Pelvic pain    Vaccine for human papilloma virus (HPV) types 6, 11, 16, and 18 administered    Vitamin D deficiency 03/12/2018   Past Surgical History:  Procedure Laterality Date   TONSILLECTOMY     WISDOM TOOTH EXTRACTION     Family History: family history includes Cirrhosis in her paternal grandfather; Diabetes in her father; Healthy in her brother, brother, brother, maternal grandfather, maternal grandmother, mother, and paternal grandmother. Social History:  reports that she quit smoking about 8 years ago. Her smoking use included cigarettes. She started smoking about 9 years ago. She has never used smokeless tobacco. She reports that she does not currently use alcohol. She reports that she does not use drugs.     Maternal Diabetes: Yes:  Diabetes Type:  Insulin/Medication controlled Genetic Screening:  Normal Maternal Ultrasounds/Referrals: Normal Fetal Ultrasounds or other Referrals:  Referred to Materal Fetal Medicine  Maternal Substance Abuse:  No Significant Maternal Medications:  Meds include: Other: she takes Celexa for anxiety Significant Maternal Lab Results:  Group B Strep negative Number of Prenatal Visits:greater than 3 verified prenatal visits Other Comments:  None  Review of Systems  Constitutional: Negative.   HENT: Negative.    Eyes: Negative.   Respiratory: Negative.    Cardiovascular: Negative.   Gastrointestinal:        Gravid abdomen. Polyhydramnios.   Endocrine: Negative.   Genitourinary: Negative.   Musculoskeletal: Negative.   Allergic/Immunologic: Negative.   Neurological: Negative.   Hematological: Negative.   Psychiatric/Behavioral: Negative.         Hx of chronic anxiety   History   Blood pressure 116/75, pulse (!) 101, temperature 98.2 F (36.8 C), temperature source Oral, height 5\' 3"  (1.6 m), weight 126.6 kg, last menstrual period 09/27/2022, currently breastfeeding. Maternal Exam:  Uterine Assessment: Contraction strength is mild.  Contraction frequency is rare.  Abdomen: Estimated fetal weight is 7 lbs.   Fetal presentation: breech Introitus: Normal vulva. Normal vagina.  Cervix: Cervix evaluated by digital exam.     Physical Exam Constitutional:      Appearance: Normal appearance. She is obese.  HENT:  Head: Normocephalic and atraumatic.  Cardiovascular:     Rate and Rhythm: Normal rate and regular rhythm.     Pulses: Normal pulses.     Heart sounds: Normal heart sounds.  Pulmonary:     Effort: Pulmonary effort is normal.     Breath sounds: Normal breath sounds.  Abdominal:     Comments: Gravid. EFW 7 lbs. High AFI of 31.  Genitourinary:    General: Normal vulva.     Rectum: Normal.     Comments: SVE: 1 cms/thick/ballotable, breech per bedside sono Musculoskeletal:        General: Normal range of motion.     Cervical back:  Normal range of motion and neck supple.  Skin:    General: Skin is warm and dry.  Neurological:     General: No focal deficit present.     Mental Status: She is alert and oriented to person, place, and time.  Psychiatric:        Mood and Affect: Mood normal.        Behavior: Behavior normal.     Prenatal labs: ABO, Rh: --/--/PENDING (08/29 0528) Antibody: PENDING (08/29 0528) Rubella: 1.76 (03/01 0854) RPR: Non Reactive (06/25 1006)  HBsAg: Negative (03/01 0854)  HIV: Non Reactive (06/25 1006)  GBS: Negative/-- (08/19 0841)   Assessment/Plan: 32 yo Gravida 2 Para 1 at 37 weeks 5 dayws gestation. Breech presentation Polyhydramnios Category 1 FHTS Patient desires external version attempt. Dr. Valentino Saxon contacted to initiate external version. Will plan on Primary Cesarean should the baby not "flip' to vertex. Medical management.    Mirna Mires 06/12/2023, 7:30 AM

## 2023-06-12 NOTE — Anesthesia Procedure Notes (Signed)
Spinal  Patient location during procedure: OB Start time: 06/12/2023 10:18 AM End time: 06/12/2023 10:28 AM Reason for block: surgical anesthesia Staffing Performed: resident/CRNA  Anesthesiologist: Stephanie Coup, MD Resident/CRNA: Katherine Basset, CRNA Performed by: Katherine Basset, CRNA Authorized by: Stephanie Coup, MD   Preanesthetic Checklist Completed: patient identified, IV checked, site marked, risks and benefits discussed, surgical consent, monitors and equipment checked, pre-op evaluation and timeout performed Spinal Block Patient position: sitting Prep: DuraPrep Patient monitoring: heart rate, cardiac monitor, continuous pulse ox and blood pressure Approach: midline Location: L3-4 Injection technique: single-shot Needle Needle type: Pencan  Needle gauge: 24 G Needle length: 9 cm Assessment Sensory level: T4 Events: CSF return Additional Notes IV functioning, monitors applied to pt. Expiration date of kit checked and confirmed to be in date. Sterile prep and drape, hand hygiene and sterile gloved used. Pt was positioned and spine was prepped in sterile fashion. Skin was anesthetized with lidocaine. Free flow of clear CSF obtained prior to injecting local anesthetic into CSF x 1 attempt. Spinal needle aspirated freely following injection. Needle was carefully withdrawn, and pt tolerated procedure well. Loss of motor and sensory on exam post injection.

## 2023-06-12 NOTE — Transfer of Care (Signed)
Immediate Anesthesia Transfer of Care Note  Patient: Hannah Stevens  Procedure(s) Performed: CESAREAN SECTION  Patient Location: Mother/Baby  Anesthesia Type:Spinal  Level of Consciousness: awake, alert , and oriented  Airway & Oxygen Therapy: Patient Spontanous Breathing  Post-op Assessment: Report given to RN and Post -op Vital signs reviewed and stable  Post vital signs: Reviewed and stable  Last Vitals:  Vitals Value Taken Time  BP 117/61 06/12/23 1206  Temp 36.4 C 06/12/23 1206  Pulse    Resp 20 06/12/23 1206  SpO2 98 % 06/12/23 1206    Last Pain:  Vitals:   06/12/23 1206  TempSrc: Temporal  PainSc: 0-No pain         Complications: No notable events documented.

## 2023-06-13 ENCOUNTER — Other Ambulatory Visit: Payer: BC Managed Care – PPO

## 2023-06-13 DIAGNOSIS — O9081 Anemia of the puerperium: Secondary | ICD-10-CM | POA: Insufficient documentation

## 2023-06-13 LAB — CBC
HCT: 25.7 % — ABNORMAL LOW (ref 36.0–46.0)
Hemoglobin: 8.6 g/dL — ABNORMAL LOW (ref 12.0–15.0)
MCH: 28.8 pg (ref 26.0–34.0)
MCHC: 33.5 g/dL (ref 30.0–36.0)
MCV: 86 fL (ref 80.0–100.0)
Platelets: 203 10*3/uL (ref 150–400)
RBC: 2.99 MIL/uL — ABNORMAL LOW (ref 3.87–5.11)
RDW: 12.6 % (ref 11.5–15.5)
WBC: 11.8 10*3/uL — ABNORMAL HIGH (ref 4.0–10.5)
nRBC: 0 % (ref 0.0–0.2)

## 2023-06-13 NOTE — Progress Notes (Signed)
Postpartum Day # 1: Cesarean Delivery (Primary for breech presentation). Pregnancy also complicated by GDM on insulin (uncontrolled), polyhydramnios, and obesity.   Subjective: Patient reports no major issues this morning. Currently in NICU feeding infant.  Notes that he will be able to room in with mom later this morning. Reports pain is well controlled with medications. Ambulating and voiding without difficulty.    Objective: Vital signs in last 24 hours: Temp:  [97.5 F (36.4 C)-98.7 F (37.1 C)] 98.7 F (37.1 C) (08/29 2347) Pulse Rate:  [72-110] 72 (08/29 2347) Resp:  [18-23] 18 (08/29 2347) BP: (95-117)/(30-76) 98/48 (08/29 2347) SpO2:  [95 %-98 %] 98 % (08/29 2347)  Physical Exam:  General: alert and no distress Lungs: clear to auscultation bilaterally Breasts: normal appearance, no masses or tenderness Heart: regular rate and rhythm, S1, S2 normal, no murmur, click, rub or gallop Abdomen: soft, non-tender; bowel sounds normal; no masses,  no organomegaly Pelvis: Lochia appropriate, Uterine Fundus firm, Incision: bandage clean/dry/intact Extremities: DVT Evaluation: No evidence of DVT seen on physical exam. Negative Homan's sign. No cords or calf tenderness. No significant calf/ankle edema.   Recent Labs    06/12/23 0528 06/13/23 0621  HGB 11.3* 8.6*  HCT 32.7* 25.7*     Assessment/Plan: Status post Cesarean section. Doing well postoperatively.  Regular diet as tolerated Continue PO pain management GDM, fasting glucose was not obtained this morning.  Will order for tomorrow.  Anemia post-operatively secondary to acute blood loss. Stable. Supplement with PO iron.  Contraception: BTL performed with C-section Currently formula feeding while in NICU, hopes to breastfeed as well.  Continue current care. Plan for discharge tomorrow if infant remains stable.     Hildred Laser, MD [facility]

## 2023-06-13 NOTE — Anesthesia Post-op Follow-up Note (Signed)
  Anesthesia Pain Follow-up Note  Patient: Hannah Stevens  Day #: 1  Date of Follow-up: 06/13/2023 Time: 7:40 AM  Last Vitals:  Vitals:   06/12/23 1900 06/12/23 2347  BP: (!) 104/56 (!) 98/48  Pulse:  72  Resp: 20 18  Temp: 36.6 C 37.1 C  SpO2: 97% 98%    Level of Consciousness: alert  Pain: none   Side Effects:None  Catheter Site Exam:clean, dry, no drainage     Plan: D/C from anesthesia care at surgeon's request  Karoline Caldwell

## 2023-06-13 NOTE — Lactation Note (Signed)
This note was copied from a baby's chart. Lactation Consultation Note  Lactation to SCN. Mother is in her bed and had made a detour from L&D to Mother baby unit. Mother is holding baby and wants to begin pumping when she is on the unit. LC to her room. Set Mother up with 21mm flanges. Mother BF her first child at breast for 8 weeks and then began to feed bottles and give her expressed milk and liked knowing exactly how much her baby was getting. Mother is wanting to BF or pump for 4-6 months. Set up DEBP Symphony at bedside with 21mm flanges. Reviewed milk storage guidelines and how to clean equipment. Reviewed [facility] booklet at bedside. LC # placed on board for any questions.   Hannah Stevens D Emit Kuenzel 06/13/2023, 2:45 PM

## 2023-06-13 NOTE — Anesthesia Postprocedure Evaluation (Signed)
Anesthesia Post Note  Patient: Hannah Stevens  Procedure(s) Performed: CESAREAN SECTION (canceled)  Patient location during evaluation: Mother Baby Anesthesia Type: Spinal Level of consciousness: oriented and awake and alert Pain management: pain level controlled Vital Signs Assessment: post-procedure vital signs reviewed and stable Respiratory status: spontaneous breathing and respiratory function stable Cardiovascular status: blood pressure returned to baseline and stable Postop Assessment: no headache, no backache, no apparent nausea or vomiting and able to ambulate Anesthetic complications: no   No notable events documented.   Last Vitals:  Vitals:   06/12/23 1900 06/12/23 2347  BP: (!) 104/56 (!) 98/48  Pulse:  72  Resp: 20 18  Temp: 36.6 C 37.1 C  SpO2: 97% 98%    Last Pain:  Vitals:   06/13/23 0530  TempSrc:   PainSc: 0-No pain                 Alize Acy Lawerance Cruel

## 2023-06-13 NOTE — Lactation Note (Signed)
This note was copied from a baby's chart. Lactation Consultation Note  Patient Name: Hannah Stevens [id number] Date: 06/13/2023 Age:32 hours Reason for consult: Follow-up assessment;Early term 26-38.6wks  Lactation Rounds: LC to the room for a visit. Family in the room burping baby after a bottle. Mother states she is due to pump and is expressing about 51ml's. Mother states feeds are going well with bottle.  LC reviewed and encouraged feeding on demand and with cues. If baby is not cueing encouraged to wake baby. Reviewed diaper counts for days of life and when to call Peds with questions. Reviewed "understanding Postpartum and Newborn care " booklet at bedside. Reviewed outpatient Lactation number and resources. Encouraged use of ice for engorgement.  Parents stated understanding with all teaching.   Maternal Data Does the patient have breastfeeding experience prior to this delivery?: Yes  Feeding Mother's Current Feeding Choice: Breast Milk and Donor Milk Nipple Type: Dr. Irving Burton Preemie  Interventions Interventions: Breast feeding basics reviewed;DEBP;Education  Discharge Discharge Education: Engorgement and breast care;Warning signs for feeding baby Pump: DEBP  Consult Status Consult Status: PRN    Hannah Stevens 06/13/2023, 3:02 PM

## 2023-06-14 ENCOUNTER — Other Ambulatory Visit: Payer: Self-pay

## 2023-06-14 LAB — GLUCOSE, CAPILLARY
Glucose-Capillary: 67 mg/dL — ABNORMAL LOW (ref 70–99)
Glucose-Capillary: 85 mg/dL (ref 70–99)

## 2023-06-14 MED ORDER — FERROUS SULFATE 325 (65 FE) MG PO TABS: 325.0000 mg | ORAL_TABLET | Freq: Every day | ORAL | 1 refills | Status: DC

## 2023-06-14 MED ORDER — FERROUS SULFATE 325 (65 FE) MG PO TABS
325.0000 mg | ORAL_TABLET | Freq: Every day | ORAL | 1 refills | Status: DC
Start: 2023-06-14 — End: 2023-07-22

## 2023-06-14 MED ORDER — OXYCODONE HCL 5 MG PO TABS
5.0000 mg | ORAL_TABLET | ORAL | 0 refills | Status: DC | PRN
Start: 2023-06-14 — End: 2023-07-22

## 2023-06-14 MED ORDER — IBUPROFEN 600 MG PO TABS: 600.0000 mg | ORAL_TABLET | Freq: Four times a day (QID) | ORAL | 0 refills | Status: DC

## 2023-06-14 MED ORDER — ACETAMINOPHEN 500 MG PO TABS: 1000.0000 mg | ORAL_TABLET | Freq: Four times a day (QID) | ORAL | Status: DC

## 2023-06-14 MED ORDER — OXYCODONE HCL 5 MG PO TABS: 5.0000 mg | ORAL_TABLET | ORAL | 0 refills | Status: DC | PRN

## 2023-06-14 NOTE — Lactation Note (Signed)
This note was copied from a baby's chart. Lactation Consultation Note  Patient Name: Hannah Stevens Date: 06/14/2023 Age:32 hours Reason for consult: Follow-up assessment;Early term 37-38.6wks;Maternal discharge   Maternal Data Does the patient have breastfeeding experience prior to this delivery?: Yes  Feeding Mother's Current Feeding Choice: Breast Milk and Donor Milk Mom plans on attempting breastfeeding at home, is pumping and bottlefeeding EBM and DBM here.  LAtCH Score Latch:  (I did not observe a feeding, mom pumping and bottlefeeding)                  Lactation Tools Discussed/Used    Interventions  Extra 20 mm nipple shield given   Discharge Pump: DEBP;Personal WIC Program: No  Consult Status Consult Status: PRN Date: 06/14/23 Follow-up type: In-patient    Hannah Stevens 06/14/2023, 11:35 AM

## 2023-06-14 NOTE — Progress Notes (Signed)
Fasting BG was 67, patient had been pumping and fasting last 7-8hrs. Juice/cereal given. recheck after eating was 85. Patient had been asymptomatic for hypoglycemia, just very hungry.

## 2023-06-14 NOTE — Progress Notes (Signed)
Patient discharged. Discharge instructions given. Patient verbalizes understanding. Transported by staff. 

## 2023-06-14 NOTE — Discharge Summary (Signed)
Postpartum Discharge Summary  Date of Service updated 06/14/2023     Patient Name: Hannah Stevens DOB: 01-07-1991 MRN: [id number]  Date of admission: 06/12/2023 Delivery date:06/12/2023 Delivering provider: Hildred Laser Date of discharge: 06/14/2023  Admitting diagnosis: Labor and delivery, indication for care [O75.9] Intrauterine pregnancy: [redacted]w[redacted]d     Secondary diagnosis:  Principal Problem:   Gestational diabetes mellitus (GDM) requiring insulin Active Problems:   Supervision of high risk pregnancy, antepartum   Obesity affecting pregnancy in third trimester   Polyhydramnios affecting pregnancy in third trimester   Unstable fetal lie   Failed external cephalic version   Anemia, postpartum  Additional problems: None     Discharge diagnosis: GDM A2 and Anemia                                              Post partum procedures: Postpartum tubal ligation   Augmentation:  N/A Complications:   Hospital course: Sceduled C/S   32 y.o. yo G2P2002 at [redacted]w[redacted]d was admitted to the hospital 06/12/2023 for scheduled Induction of labor with external cephalic version for malpresentation, poorly controlled gestational diabetes on insulin, and polyhydramnios.  After unsuccessful attempts, patient proceeded to cesarean section with the following indication: malpresentation with failed external cephalic version.Delivery details are as follows:  Membrane Rupture Time/Date: 11:05 AM,06/12/2023  Delivery Method:C-Section, Low Transverse Operative Delivery:N/A Details of operation can be found in separate operative note.  Patient had an  uncomplicated postpartum course.  She is ambulating, tolerating a regular diet, passing flatus, and urinating well. Patient is discharged home in stable condition on  06/14/23        Newborn Data: Birth date:06/12/2023 Birth time:11:07 AM Gender:Female Living status:Living Apgars:9 ,9  Weight:3350 g    Magnesium Sulfate received: No BMZ received:  No Rhophylac:No MMR:N/A, up to date T-DaP:Given prenatally Flu: No Transfusion:  Physical exam  Vitals:   06/13/23 1035 06/13/23 1515 06/13/23 2202 06/14/23 0747  BP: 115/61 126/65 (!) 100/47 111/69  Pulse: 78 87 76 76  Resp: 20 20 16 18   Temp: 98 F (36.7 C) 98.6 F (37 C) 98.2 F (36.8 C) 98.7 F (37.1 C)  TempSrc: Oral Oral    SpO2: 99% 99% 96% 100%  Weight:      Height:       General: alert and no distress Lochia: appropriate Uterine Fundus: firm Incision: Healing well with no significant drainage, No significant erythema DVT Evaluation: No evidence of DVT seen on physical exam. Negative Homan's sign. No cords or calf tenderness.   Labs: Lab Results  Component Value Date   WBC 11.8 (H) 06/13/2023   HGB 8.6 (L) 06/13/2023   HCT 25.7 (L) 06/13/2023   MCV 86.0 06/13/2023   PLT 203 06/13/2023      Latest Ref Rng & Units 06/12/2023    5:28 AM  CMP  Glucose 70 - 99 mg/dL 96    Edinburgh Score:    11/22/2022    8:30 AM  Edinburgh Postnatal Depression Scale Screening Tool  I have been able to laugh and see the funny side of things. 0  I have looked forward with enjoyment to things. 0  I have blamed myself unnecessarily when things went wrong. 0  I have been anxious or worried for no good reason. 0  I have felt scared or panicky for no good reason. 0  Things have been getting on top of me. 2  I have been so unhappy that I have had difficulty sleeping. 0  I have felt sad or miserable. 0  I have been so unhappy that I have been crying. 0  The thought of harming myself has occurred to me. 0  Edinburgh Postnatal Depression Scale Total 2      After visit meds:  Allergies as of 06/14/2023   No Known Allergies      Medication List     STOP taking these medications    Abdominal Binder/Elastic XL Misc   aspirin EC 81 MG tablet   Blood Glucose Monitoring Suppl Devi   insulin NPH Human 100 UNIT/ML injection Commonly known as: NOVOLIN N       TAKE  these medications    acetaminophen 500 MG tablet Commonly known as: TYLENOL Take 2 tablets (1,000 mg total) by mouth every 6 (six) hours.   citalopram 10 MG tablet Commonly known as: CELEXA Take 1 tablet (10 mg total) by mouth daily.   ferrous sulfate 325 (65 FE) MG tablet Take 1 tablet (325 mg total) by mouth daily.   ibuprofen 600 MG tablet Commonly known as: ADVIL Take 1 tablet (600 mg total) by mouth every 6 (six) hours.   oxyCODONE 5 MG immediate release tablet Commonly known as: Oxy IR/ROXICODONE Take 1-2 tablets (5-10 mg total) by mouth every 4 (four) hours as needed for severe pain ((when tolerating fluids)).   polycarbophil 625 MG tablet Commonly known as: FIBERCON Take 625 mg by mouth daily.   prenatal vitamin w/FE, FA 29-1 MG Chew chewable tablet Chew 2 tablets by mouth daily at 12 noon.         Discharge home in stable condition Infant Feeding: Bottle and Breast Infant Disposition:home with mother Discharge instruction: per After Visit Summary and Postpartum booklet. Activity: Advance as tolerated. Pelvic rest for 6 weeks.  Diet: routine diet Anticipated Birth Control:  BTL done with 6 CS Postpartum Appointment:6 weeks Additional Postpartum F/U: Incision check 1 week Future Appointments:No future appointments. Follow up Visit:      06/14/2023 Lindalou Hose Janace Decker, CNM

## 2023-06-17 ENCOUNTER — Other Ambulatory Visit: Payer: BC Managed Care – PPO

## 2023-06-18 NOTE — Progress Notes (Unsigned)
    OBSTETRICS/GYNECOLOGY POST-OPERATIVE CLINIC VISIT  Subjective:     Hannah Stevens is a 32 y.o. female who presents to the clinic 1 weeks status post CESAREAN SECTION  for unstable lie (breech at presenation), uncontrolled GDM, polyhydramnios.  Delivery was at [redacted] weeks gestation. Eating a regular diet without difficulty. Bowel movements are abnormal with irregular movements . Pain is controlled with current analgesics. Medications being used: ibuprofen (OTC).  Notes some difficulty controlling her bladder.  Leaks urine with certain movements. Reports stable mood. Remains on her Celexa, taking daily. Also utilized during pregnancy.   The following portions of the patient's history were reviewed and updated as appropriate: allergies, current medications, past family history, past medical history, past social history, past surgical history, and problem list.  Review of Systems Pertinent items are noted in HPI.   Objective:   BP 107/75   Pulse 80   Resp 16   Ht 5\' 3"  (1.6 m)   Wt 266 lb 6.4 oz (120.8 kg)   BMI 47.19 kg/m  Body mass index is 47.19 kg/m.  General:  alert and no distress  Abdomen: soft, bowel sounds active, non-tender  Incision:   healing well, no drainage, no erythema, no hernia, no seroma, no swelling, no dehiscence, incision well approximated    Pathology:  None  Assessment:   Patient s/p CESAREAN SECTION  Doing well postoperatively.  Stress incontinence H/o GDM Plan:   1. Continue any current medications as instructed by provider. 2. Wound care discussed. 3. Advised on Kegel exercises and bladder training for urinary issues. If no improvement, can consider referral to pelvic floor PT. 4. Activity restrictions: no bending, stooping, or squatting, no lifting more than 10-15 pounds, and pelvic rest 5. Anticipated return to work:  4-5 weeks, if applicable . 6. Follow up: 1 week for 2 week postpartum, televisit 7. H/o GDM, will need 2 hr PP glucola or a repeat  A1c after postpartum visit    Hildred Laser, MD [facility]

## 2023-06-19 ENCOUNTER — Ambulatory Visit (INDEPENDENT_AMBULATORY_CARE_PROVIDER_SITE_OTHER): Payer: BC Managed Care – PPO | Admitting: Obstetrics and Gynecology

## 2023-06-19 ENCOUNTER — Telehealth: Payer: Self-pay

## 2023-06-19 ENCOUNTER — Encounter: Payer: Self-pay | Admitting: Obstetrics and Gynecology

## 2023-06-19 VITALS — BP 107/75 | HR 80 | Resp 16 | Ht 63.0 in | Wt 266.4 lb

## 2023-06-19 DIAGNOSIS — Z98891 History of uterine scar from previous surgery: Secondary | ICD-10-CM | POA: Insufficient documentation

## 2023-06-19 DIAGNOSIS — Z8632 Personal history of gestational diabetes: Secondary | ICD-10-CM

## 2023-06-19 DIAGNOSIS — N393 Stress incontinence (female) (male): Secondary | ICD-10-CM

## 2023-06-19 DIAGNOSIS — Z4889 Encounter for other specified surgical aftercare: Secondary | ICD-10-CM

## 2023-06-19 NOTE — Patient Instructions (Signed)

## 2023-06-19 NOTE — Telephone Encounter (Signed)
[facility]- Discharge Call Backs- Spoke to patient on the phone about the following below. 1-Do you have any questions or concerns about yourself as you heal?No  C-Sec 2-Any concerns or questions about your baby?No Is your baby eating, peeing,pooping well?Yes 3-Reviewed ABC's of safe sleep. 4-How was your stay at the hospital?Great 5- Did our team work together to care for you?Yes You should be receiving a survey in the mail soon.   We would really appreciate it if you could fill that out for [place] and return it in the mail.  We value the feedback to make improvements and continue the great work we do.   If you have any questions please feel free to call me back at [phone]

## 2023-06-24 ENCOUNTER — Other Ambulatory Visit: Payer: BC Managed Care – PPO

## 2023-06-24 ENCOUNTER — Encounter: Payer: BC Managed Care – PPO | Admitting: Certified Nurse Midwife

## 2023-07-15 ENCOUNTER — Ambulatory Visit: Payer: BC Managed Care – PPO | Admitting: Obstetrics and Gynecology

## 2023-07-17 NOTE — Progress Notes (Signed)
   OBSTETRICS POSTPARTUM CLINIC PROGRESS NOTE  Subjective:     Hannah Stevens is a 32 y.o. G45P2002 female who presents for a postpartum visit. She is 6 weeks postpartum following a primary low cervical transverse Cesarean section for unstable lie (breech at presenation), uncontrolled GDM, polyhydramnios. Delivery was at [redacted] weeks gestation. I have fully reviewed the prenatal and intrapartum course. The delivery was at 37.6 gestational weeks.  Anesthesia:  spinal . Postpartum course has been going well. Baby's course has been good. Baby is feeding by breast/pumping, usually q 3 hours, produces 5-7 oz with each session. Bleeding: patient has not not resumed menses, with No LMP recorded.. Bowel function is normal. Bladder function is normal. Patient is not sexually active. Contraception method desired is tubal ligation. Postpartum depression screening: negative.  EDPS score is 5.    The following portions of the patient's history were reviewed and updated as appropriate: allergies, current medications, past family history, past medical history, past social history, past surgical history, and problem list.  Review of Systems Pertinent items noted in HPI and remainder of comprehensive ROS otherwise negative.   Objective:    BP 83/55, repeat 112/82   Resp 16   Ht 5\' 3"  (1.6 m)   Wt 259 lb (117.5 kg)   Breastfeeding Yes   BMI 45.88 kg/m   General:  alert and no distress   Breasts:  inspection negative, no nipple discharge or bleeding, no masses or nodularity palpable  Lungs: clear to auscultation bilaterally  Heart:  regular rate and rhythm, S1, S2 normal, no murmur, click, rub or gallop  Abdomen: soft, non-tender; bowel sounds normal; no masses,  no organomegaly.  Well healed Pfannenstiel incision   Vulva:  normal  Vagina: normal vagina, no discharge, exudate, lesion, or erythema  Cervix:  no cervical motion tenderness and no lesions  Corpus: normal size, contour, position, consistency,  mobility, non-tender  Adnexa:  normal adnexa and no mass, fullness, tenderness  Rectal Exam: Not performed.         Labs:  Lab Results  Component Value Date   HGB 8.6 (L) 06/13/2023     Assessment:   1. Postpartum anemia      Plan:   1. Contraception: tubal ligation 2. Hgb check for h/o postpartum anemia of less than 10, today was 12.6.  3. H/o GDM (uncontrolled on insulin). Discussed need for postpartum screening, can be in the form of 2 hr PP glucose screen within the next few weeks, or can have HgbA1c repeated after 12 weeks postpartum. Patient opts to return in 12 weeks. Will schedule for annual exam at that time.  4. Follow up in: 3 months for annual exam.    Hildred Laser, MD [facility]

## 2023-07-17 NOTE — Patient Instructions (Signed)

## 2023-07-22 ENCOUNTER — Ambulatory Visit: Payer: BC Managed Care – PPO | Admitting: Obstetrics and Gynecology

## 2023-07-22 ENCOUNTER — Encounter: Payer: Self-pay | Admitting: Obstetrics and Gynecology

## 2023-07-22 VITALS — BP 112/82 | Resp 16 | Ht 63.0 in | Wt 259.0 lb

## 2023-07-22 DIAGNOSIS — O9903 Anemia complicating the puerperium: Secondary | ICD-10-CM

## 2023-07-22 DIAGNOSIS — D649 Anemia, unspecified: Secondary | ICD-10-CM | POA: Diagnosis not present

## 2023-07-22 DIAGNOSIS — O9081 Anemia of the puerperium: Secondary | ICD-10-CM

## 2023-07-22 LAB — POCT HEMOGLOBIN: Hemoglobin: 12.4 g/dL (ref 11–14.6)

## 2023-08-23 ENCOUNTER — Other Ambulatory Visit: Payer: Self-pay | Admitting: Obstetrics and Gynecology

## 2023-08-23 DIAGNOSIS — O24419 Gestational diabetes mellitus in pregnancy, unspecified control: Secondary | ICD-10-CM

## 2023-08-23 DIAGNOSIS — O0993 Supervision of high risk pregnancy, unspecified, third trimester: Secondary | ICD-10-CM

## 2023-09-01 DIAGNOSIS — R059 Cough, unspecified: Secondary | ICD-10-CM | POA: Diagnosis not present

## 2023-09-01 DIAGNOSIS — J069 Acute upper respiratory infection, unspecified: Secondary | ICD-10-CM | POA: Diagnosis not present

## 2023-09-06 ENCOUNTER — Other Ambulatory Visit: Payer: Self-pay

## 2023-09-20 ENCOUNTER — Other Ambulatory Visit: Payer: Self-pay | Admitting: Obstetrics & Gynecology

## 2023-10-13 ENCOUNTER — Other Ambulatory Visit: Payer: Self-pay

## 2023-10-13 MED ORDER — CITALOPRAM HYDROBROMIDE 10 MG PO TABS: 10.0000 mg | ORAL_TABLET | Freq: Every day | ORAL | 0 refills | Status: DC

## 2023-10-23 ENCOUNTER — Encounter: Payer: Self-pay | Admitting: Dermatology

## 2023-10-23 ENCOUNTER — Ambulatory Visit: Payer: BC Managed Care – PPO | Admitting: Dermatology

## 2023-10-23 DIAGNOSIS — D2239 Melanocytic nevi of other parts of face: Secondary | ICD-10-CM

## 2023-10-23 DIAGNOSIS — D492 Neoplasm of unspecified behavior of bone, soft tissue, and skin: Secondary | ICD-10-CM

## 2023-10-23 DIAGNOSIS — D489 Neoplasm of uncertain behavior, unspecified: Secondary | ICD-10-CM

## 2023-10-23 NOTE — Progress Notes (Signed)
   Follow-Up Visit   Subjective  Hannah Stevens is a 33 y.o. female who presents for the following: Patient here today for spot at corner of left mouth. She reports spot has been there for years but in last month it has become tender and painful and swollen. Previously became symptomatic, then calmed down, then became symptomatic again. Used to be a flat brown spot.   The patient has spots, moles and lesions to be evaluated, some may be new or changing and the patient may have concern these could be cancer.   The following portions of the chart were reviewed this encounter and updated as appropriate: medications, allergies, medical history  Review of Systems:  No other skin or systemic complaints except as noted in HPI or Assessment and Plan.  Objective  Well appearing patient in no apparent distress; mood and affect are within normal limits.   A focused examination was performed of the following areas: Left cheek  Relevant exam findings are noted in the Assessment and Plan.  left cheek 5 mm pink papule with focal pigment    Assessment & Plan     NEOPLASM OF UNCERTAIN BEHAVIOR left cheek Skin / nail biopsy Type of biopsy: tangential   Informed consent: discussed and consent obtained   Timeout: patient name, date of birth, surgical site, and procedure verified   Procedure prep:  Patient was prepped and draped in usual sterile fashion Prep type:  Isopropyl alcohol Anesthesia: the lesion was anesthetized in a standard fashion   Anesthetic:  1% lidocaine  w/ epinephrine  1-100,000 buffered w/ 8.4% NaHCO3 Instrument used: DermaBlade   Hemostasis achieved with: pressure and aluminum chloride   Outcome: patient tolerated procedure well   Post-procedure details: sterile dressing applied and wound care instructions given   Dressing type: bandage and petrolatum   Irritated nevus r/o dysplasia     Return if symptoms worsen or fail to improve.  I, Eleanor Blush, CMA, am acting as  scribe for Boneta Sharps, MD.   Documentation: I have reviewed the above documentation for accuracy and completeness, and I agree with the above.  Boneta Sharps, MD

## 2023-10-23 NOTE — Patient Instructions (Addendum)
Biopsy Wound Care Instructions  Leave the original bandage on for 24 hours if possible.  If the bandage becomes soaked or soiled before that time, it is OK to remove it and examine the wound.  A small amount of post-operative bleeding is normal.  If excessive bleeding occurs, remove the bandage, place gauze over the site and apply continuous pressure (no peeking) over the area for 30 minutes. If this does not work, please call our clinic as soon as possible or page your doctor if it is after hours.   Once a day, cleanse the wound with soap and water. It is fine to shower. If a thick crust develops you may use a Q-tip dipped into dilute hydrogen peroxide (mix 1:1 with water) to dissolve it.  Hydrogen peroxide can slow the healing process, so use it only as needed.    After washing, apply petroleum jelly (Vaseline) or an antibiotic ointment if your doctor prescribed one for you, followed by a bandage.    For best healing, the wound should be covered with a layer of ointment at all times. If you are not able to keep the area covered with a bandage to hold the ointment in place, this may mean re-applying the ointment several times a day.  Continue this wound care until the wound has healed and is no longer open.   Itching and mild discomfort is normal during the healing process. However, if you develop pain or severe itching, please call our office.   If you have any discomfort, you can take Tylenol (acetaminophen) or ibuprofen as directed on the bottle. (Please do not take these if you have an allergy to them or cannot take them for another reason).  Some redness, tenderness and white or yellow material in the wound is normal healing.  If the area becomes very sore and red, or develops a thick yellow-green material (pus), it may be infected; please notify us.    If you have stitches, return to clinic as directed to have the stitches removed. You will continue wound care for 2-3 days after the stitches  are removed.   Wound healing continues for up to one year following surgery. It is not unusual to experience pain in the scar from time to time during the interval.  If the pain becomes severe or the scar thickens, you should notify the office.    A slight amount of redness in a scar is expected for the first six months.  After six months, the redness will fade and the scar will soften and fade.  The color difference becomes less noticeable with time.  If there are any problems, return for a post-op surgery check at your earliest convenience.  To improve the appearance of the scar, you can use silicone scar gel, cream, or sheets (such as Mederma or Serica) every night for up to one year. These are available over the counter (without a prescription).  Please call our office at [phone] for any questions or concerns.      Due to recent changes in healthcare laws, you may see results of your pathology and/or laboratory studies on MyChart before the doctors have had a chance to review them. We understand that in some cases there may be results that are confusing or concerning to you. Please understand that not all results are received at the same time and often the doctors may need to interpret multiple results in order to provide you with the best plan of care or  course of treatment. Therefore, we ask that you please give [place] 2 business days to thoroughly review all your results before contacting the office for clarification. Should we see a critical lab result, you will be contacted sooner.   If You Need Anything After Your Visit  If you have any questions or concerns for your doctor, please call our main line at [phone] and press option 4 to reach your doctor's medical assistant. If no one answers, please leave a voicemail as directed and we will return your call as soon as possible. Messages left after 4 pm will be answered the following business day.   You may also send [place] a message  via MyChart. We typically respond to MyChart messages within 1-2 business days.  For prescription refills, please ask your pharmacy to contact our office. Our fax number is [phone].  If you have an urgent issue when the clinic is closed that cannot wait until the next business day, you can page your doctor at the number below.    Please note that while we do our best to be available for urgent issues outside of office hours, we are not available 24/7.   If you have an urgent issue and are unable to reach [place], you may choose to seek medical care at your doctor's office, retail clinic, urgent care center, or emergency room.  If you have a medical emergency, please immediately call 911 or go to the emergency department.  Pager Numbers  - Dr. Gwen Pounds: [phone]  - Dr. Roseanne Reno: [phone]  - Dr. Katrinka Blazing: [phone]   In the event of inclement weather, please call our main line at [phone] for an update on the status of any delays or closures.  Dermatology Medication Tips: Please keep the boxes that topical medications come in in order to help keep track of the instructions about where and how to use these. Pharmacies typically print the medication instructions only on the boxes and not directly on the medication tubes.   If your medication is too expensive, please contact our office at [phone] option 4 or send [place] a message through MyChart.   We are unable to tell what your co-pay for medications will be in advance as this is different depending on your insurance coverage. However, we may be able to find a substitute medication at lower cost or fill out paperwork to get insurance to cover a needed medication.   If a prior authorization is required to get your medication covered by your insurance company, please allow [place] 1-2 business days to complete this process.  Drug prices often vary depending on where the prescription is filled and some pharmacies may offer cheaper  prices.  The website www.goodrx.com contains coupons for medications through different pharmacies. The prices here do not account for what the cost may be with help from insurance (it may be cheaper with your insurance), but the website can give you the price if you did not use any insurance.  - You can print the associated coupon and take it with your prescription to the pharmacy.  - You may also stop by our office during regular business hours and pick up a GoodRx coupon card.  - If you need your prescription sent electronically to a different pharmacy, notify our office through [facility] or by phone at [phone] option 4.     Si Usted Necesita Algo Despus de Su Visita  Tambin puede enviarnos un mensaje a travs de Clinical cytogeneticist. Por lo general respondemos  a los mensajes de MyChart en el transcurso de 1 a 2 das hbiles.  Para renovar recetas, por favor pida a su farmacia que se ponga en contacto con nuestra oficina. Annie Sable de fax es [place] [phone].  Si tiene un asunto urgente cuando la clnica est cerrada y que no puede esperar hasta el siguiente da hbil, puede llamar/localizar a su doctor(a) al nmero que aparece a continuacin.   Por favor, tenga en cuenta que aunque hacemos todo lo posible para estar disponibles para asuntos urgentes fuera del horario de [place], no estamos disponibles las 24 horas del da, los 7 [address]  [address] de la [place].   Si tiene un problema urgente y no puede comunicarse con nosotros, puede optar por buscar atencin mdica  en el consultorio de su doctor(a), en una clnica privada, en un centro de atencin urgente o en una sala de emergencias.  Si tiene Engineer, drilling, por favor llame inmediatamente al 911 o vaya a la sala de emergencias.  Nmeros de bper  - Dr. Gwen Pounds: [phone]  - Dra. Roseanne Reno: [phone]  - Dr. Katrinka Blazing: [phone]   En caso de inclemencias del tiempo, por favor llame a Lacy Duverney principal al [phone]  para una actualizacin sobre el [place] de cualquier retraso o cierre.  Consejos para la medicacin en dermatologa: Por favor, guarde las cajas en las que vienen los medicamentos de uso tpico para ayudarle a seguir las instrucciones sobre dnde y cmo usarlos. Las farmacias generalmente imprimen las instrucciones del medicamento slo en las cajas y no directamente en los tubos del [place].   Si su medicamento es muy caro, por favor, pngase en contacto con Rolm Gala llamando al [phone] y presione la opcin 4 o envenos un mensaje a travs de Clinical cytogeneticist.   No podemos decirle cul ser su copago por los medicamentos por adelantado ya que esto es diferente dependiendo de la cobertura de su seguro. Sin embargo, es posible que podamos encontrar un medicamento sustituto a Audiological scientist un formulario para que el seguro cubra el medicamento que se considera necesario.   Si se requiere una autorizacin previa para que su compaa de seguros [place] su medicamento, por favor permtanos de 1 a 2 das hbiles para completar [address]  Los precios de los medicamentos varan con frecuencia dependiendo del Environmental consultant de dnde se surte la receta y alguna farmacias pueden ofrecer precios ms baratos.  El sitio web www.goodrx.com tiene cupones para medicamentos de Health and safety inspector. Los precios aqu no tienen en cuenta lo que podra costar con la ayuda del seguro (puede ser ms barato con su seguro), pero el sitio web puede darle el precio si no utiliz Tourist information centre manager.  - Puede imprimir el cupn correspondiente y llevarlo con su receta a la farmacia.  - Tambin puede pasar por nuestra oficina durante el horario de atencin regular y Education officer, museum una tarjeta de cupones de GoodRx.  - Si necesita que su receta se enve electrnicamente a una farmacia diferente, informe a nuestra oficina a travs de MyChart de [place] o por telfono llamando al [phone] y presione la opcin 4.

## 2023-10-27 LAB — SURGICAL PATHOLOGY

## 2023-10-29 ENCOUNTER — Ambulatory Visit: Payer: BC Managed Care – PPO | Admitting: Certified Nurse Midwife

## 2023-10-29 ENCOUNTER — Encounter: Payer: Self-pay | Admitting: Certified Nurse Midwife

## 2023-10-29 VITALS — BP 107/71 | HR 74 | Ht 63.0 in | Wt 264.9 lb

## 2023-10-29 DIAGNOSIS — Z23 Encounter for immunization: Secondary | ICD-10-CM | POA: Diagnosis not present

## 2023-10-29 DIAGNOSIS — Z01419 Encounter for gynecological examination (general) (routine) without abnormal findings: Secondary | ICD-10-CM | POA: Diagnosis not present

## 2023-10-29 MED ORDER — SEMAGLUTIDE-WEIGHT MANAGEMENT 0.25 MG/0.5ML ~~LOC~~ SOAJ: 0.2500 mg | SUBCUTANEOUS | 0 refills | Status: AC

## 2023-10-29 NOTE — Patient Instructions (Addendum)
 Preventive Care 20-33 Years Old, Female Preventive care refers to lifestyle choices and visits with your health care provider that can promote health and wellness. Preventive care visits are also called wellness exams. What can I expect for my preventive care visit? Counseling During your preventive care visit, your health care provider may ask about your: Medical history, including: Past medical problems. Family medical history. Pregnancy history. Current health, including: Menstrual cycle. Method of birth control. Emotional well-being. Home life and relationship well-being. Sexual activity and sexual health. Lifestyle, including: Alcohol, nicotine  or tobacco, and drug use. Access to firearms. Diet, exercise, and sleep habits. Work and work Astronomer. Sunscreen use. Safety issues such as seatbelt and bike helmet use. Physical exam Your health care provider may check your: Height and weight. These may be used to calculate your BMI (body mass index). BMI is a measurement that tells if you are at a healthy weight. Waist circumference. This measures the distance around your waistline. This measurement also tells if you are at a healthy weight and may help predict your risk of certain diseases, such as type 2 diabetes and high blood pressure. Heart rate and blood pressure. Body temperature. Skin for abnormal spots. What immunizations do I need?  Vaccines are usually given at various ages, according to a schedule. Your health care provider will recommend vaccines for you based on your age, medical history, and lifestyle or other factors, such as travel or where you work. What tests do I need? Screening Your health care provider may recommend screening tests for certain conditions. This may include: Pelvic exam and Pap test. Lipid and cholesterol levels. Diabetes screening. This is done by checking your blood sugar (glucose) after you have not eaten for a while (fasting). Hepatitis  B test. Hepatitis C test. HIV (human immunodeficiency virus) test. STI (sexually transmitted infection) testing, if you are at risk. BRCA-related cancer screening. This may be done if you have a family history of breast, ovarian, tubal, or peritoneal cancers. Talk with your health care provider about your test results, treatment options, and if necessary, the need for more tests. Follow these instructions at home: Eating and drinking  Eat a healthy diet that includes fresh fruits and vegetables, whole grains, lean protein, and low-fat dairy products. Take vitamin and mineral supplements as recommended by your health care provider. Do not drink alcohol if: Your health care provider tells you not to drink. You are pregnant, may be pregnant, or are planning to become pregnant. If you drink alcohol: Limit how much you have to 0-1 drink a day. Know how much alcohol is in your drink. In the [place] one drink equals one 12 oz bottle of beer (355 mL), one 5 oz glass of wine (148 mL), or one 1 oz glass of hard liquor (44 mL). Lifestyle Brush your teeth every morning and night with fluoride toothpaste. Floss one time each day. Exercise for at least 30 minutes 5 or more days each week. Do not use any products that contain nicotine  or tobacco. These products include cigarettes, chewing tobacco, and vaping devices, such as e-cigarettes. If you need help quitting, ask your health care provider. Do not use drugs. If you are sexually active, practice safe sex. Use a condom or other form of protection to prevent STIs. If you do not wish to become pregnant, use a form of birth control. If you plan to become pregnant, see your health care provider for a prepregnancy visit. Find healthy ways to manage stress, such as: Meditation,  yoga, or listening to music. Journaling. Talking to a trusted person. Spending time with friends and family. Minimize exposure to UV radiation to reduce your risk of skin  cancer. Safety Always wear your seat belt while driving or riding in a vehicle. Do not drive: If you have been drinking alcohol. Do not ride with someone who has been drinking. If you have been using any mind-altering substances or drugs. While texting. When you are tired or distracted. Wear a helmet and other protective equipment during sports activities. If you have firearms in your house, make sure you follow all gun safety procedures. Seek help if you have been physically or sexually abused. What's next? Go to your health care provider once a year for an annual wellness visit. Ask your health care provider how often you should have your eyes and teeth checked. Stay up to date on all vaccines. This information is not intended to replace advice given to you by your health care provider. Make sure you discuss any questions you have with your health care provider. Document Revised: 03/28/2021 Document Reviewed: 03/28/2021 Elsevier Patient Education  2024 [employer] Calorie Counting for [employer] Loss Calories are units of energy. Your body needs a certain number of calories from food to keep going throughout the day. When you eat or drink more calories than your body needs, your body stores the extra calories mostly as fat. When you eat or drink fewer calories than your body needs, your body burns fat to get the energy it needs. Calorie counting means keeping track of how many calories you eat and drink each day. Calorie counting can be helpful if you need to lose weight. If you eat fewer calories than your body needs, you should lose weight. Ask your health care provider what a healthy weight is for you. For calorie counting to work, you will need to eat the right number of calories each day to lose a healthy amount of weight per week. A dietitian can help you figure out how many calories you need in a day and will suggest ways to reach your calorie goal. A healthy amount of weight to lose  each week is usually 1-2 lb (0.5-0.9 kg). This usually means that your daily calorie intake should be reduced by 500-750 calories. Eating 1,200-1,500 calories a day can help most women lose weight. Eating 1,500-1,800 calories a day can help most men lose weight. What do I need to know about calorie counting? Work with your health care provider or dietitian to determine how many calories you should get each day. To meet your daily calorie goal, you will need to: Find out how many calories are in each food that you would like to eat. Try to do this before you eat. Decide how much of the food you plan to eat. Keep a food log. Do this by writing down what you ate and how many calories it had. To successfully lose weight, it is important to balance calorie counting with a healthy lifestyle that includes regular activity. Where do I find calorie information?  The number of calories in a food can be found on a Nutrition Facts label. If a food does not have a Nutrition Facts label, try to look up the calories online or ask your dietitian for help. Remember that calories are listed per serving. If you choose to have more than one serving of a food, you will have to multiply the calories per serving by the number of servings you plan  to eat. For example, the label on a package of bread might say that a serving size is 1 slice and that there are 90 calories in a serving. If you eat 1 slice, you will have eaten 90 calories. If you eat 2 slices, you will have eaten 180 calories. How do I keep a food log? After each time that you eat, record the following in your food log as soon as possible: What you ate. Be sure to include toppings, sauces, and other extras on the food. How much you ate. This can be measured in cups, ounces, or number of items. How many calories were in each food and drink. The total number of calories in the food you ate. Keep your food log near you, such as in a pocket-sized notebook or on  an app or website on your mobile phone. Some programs will calculate calories for you and show you how many calories you have left to meet your daily goal. What are some portion-control tips? Know how many calories are in a serving. This will help you know how many servings you can have of a certain food. Use a measuring cup to measure serving sizes. You could also try weighing out portions on a kitchen scale. With time, you will be able to estimate serving sizes for some foods. Take time to put servings of different foods on your favorite plates or in your favorite bowls and cups so you know what a serving looks like. Try not to eat straight from a food's packaging, such as from a bag or box. Eating straight from the package makes it hard to see how much you are eating and can lead to overeating. Put the amount you would like to eat in a cup or on a plate to make sure you are eating the right portion. Use smaller plates, glasses, and bowls for smaller portions and to prevent overeating. Try not to multitask. For example, avoid watching TV or using your computer while eating. If it is time to eat, sit down at a table and enjoy your food. This will help you recognize when you are full. It will also help you be more mindful of what and how much you are eating. What are tips for following this plan? Reading food labels Check the calorie count compared with the serving size. The serving size may be smaller than what you are used to eating. Check the source of the calories. Try to choose foods that are high in protein, fiber, and vitamins, and low in saturated fat, trans fat, and sodium. Shopping Read nutrition labels while you shop. This will help you make healthy decisions about which foods to buy. Pay attention to nutrition labels for low-fat or fat-free foods. These foods sometimes have the same number of calories or more calories than the full-fat versions. They also often have added sugar, starch, or  salt to make up for flavor that was removed with the fat. Make a grocery list of lower-calorie foods and stick to it. Cooking Try to cook your favorite foods in a healthier way. For example, try baking instead of frying. Use low-fat dairy products. Meal planning Use more fruits and vegetables. One-half of your plate should be fruits and vegetables. Include lean proteins, such as chicken, [place], and fish. Lifestyle Each week, aim to do one of the following: 150 minutes of moderate exercise, such as walking. 75 minutes of vigorous exercise, such as running. General information Know how many calories are in  the foods you eat most often. This will help you calculate calorie counts faster. Find a way of tracking calories that works for you. Get creative. Try different apps or programs if writing down calories does not work for you. What foods should I eat?  Eat nutritious foods. It is better to have a nutritious, high-calorie food, such as an avocado, than a food with few nutrients, such as a bag of potato chips. Use your calories on foods and drinks that will fill you up and will not leave you hungry soon after eating. Examples of foods that fill you up are nuts and nut butters, vegetables, lean proteins, and high-fiber foods such as whole grains. High-fiber foods are foods with more than 5 g of fiber per serving. Pay attention to calories in drinks. Low-calorie drinks include water and unsweetened drinks. The items listed above may not be a complete list of foods and beverages you can eat. Contact a dietitian for more information. What foods should I limit? Limit foods or drinks that are not good sources of vitamins, minerals, or protein or that are high in unhealthy fats. These include: Candy. Other sweets. Sodas, specialty coffee drinks, alcohol, and juice. The items listed above may not be a complete list of foods and beverages you should avoid. Contact a dietitian for more  information. How do I count calories when eating out? Pay attention to portions. Often, portions are much larger when eating out. Try these tips to keep portions smaller: Consider sharing a meal instead of getting your own. If you get your own meal, eat only half of it. Before you start eating, ask for a container and put half of your meal into it. When available, consider ordering smaller portions from the menu instead of full portions. Pay attention to your food and drink choices. Knowing the way food is cooked and what is included with the meal can help you eat fewer calories. If calories are listed on the menu, choose the lower-calorie options. Choose dishes that include vegetables, fruits, whole grains, low-fat dairy products, and lean proteins. Choose items that are boiled, broiled, grilled, or steamed. Avoid items that are buttered, battered, fried, or served with cream sauce. Items labeled as crispy are usually fried, unless stated otherwise. Choose water, low-fat milk, unsweetened iced tea, or other drinks without added sugar. If you want an alcoholic beverage, choose a lower-calorie option, such as a glass of wine or light beer. Ask for dressings, sauces, and syrups on the side. These are usually high in calories, so you should limit the amount you eat. If you want a salad, choose a garden salad and ask for grilled meats. Avoid extra toppings such as bacon, cheese, or fried items. Ask for the dressing on the side, or ask for olive oil and vinegar or lemon to use as dressing. Estimate how many servings of a food you are given. Knowing serving sizes will help you be aware of how much food you are eating at restaurants. Where to find more information Centers for Disease Control and Prevention: FootballExhibition.com.br [place]. Department of Agriculture: WrestlingReporter.dk Summary Calorie counting means keeping track of how many calories you eat and drink each day. If you eat fewer calories than your body needs, you  should lose weight. A healthy amount of weight to lose per week is usually 1-2 lb (0.5-0.9 kg). This usually means reducing your daily calorie intake by 500-750 calories. The number of calories in a food can be found on a Nutrition  Facts label. If a food does not have a Nutrition Facts label, try to look up the calories online or ask your dietitian for help. Use smaller plates, glasses, and bowls for smaller portions and to prevent overeating. Use your calories on foods and drinks that will fill you up and not leave you hungry shortly after a meal. This information is not intended to replace advice given to you by your health care provider. Make sure you discuss any questions you have with your health care provider. Document Revised: 11/11/2019 Document Reviewed: 11/11/2019 Elsevier Patient Education  2023 [employer].

## 2023-10-29 NOTE — Progress Notes (Signed)
 GYNECOLOGY ANNUAL PREVENTATIVE CARE ENCOUNTER NOTE  History:     Hannah Stevens is a 33 y.o. [id number] female here for a routine annual gynecologic exam.  Current complaints: interested in weight loss medication .   Denies abnormal vaginal bleeding, discharge, pelvic pain, problems with intercourse or other gynecologic concerns.     Social Relationship: married  Living: with spouse and children Work:  International aid/development worker Exercise:  active at work, walking  Smoke/Alcohol/drug use: occasional alcohol use   Gynecologic History Patient's last menstrual period was 10/29/2023 (exact date). Contraception: tubal ligation Last Pap: 11/05/2021. Results were: normal with negative HPV Last mammogram: n/a . :   Obstetric History OB History  Gravida Para Term Preterm AB Living  2 2 2  0 0 2  SAB IAB Ectopic Multiple Live Births  0 0 0 0 2    # Outcome Date GA Lbr Len/2nd Weight Sex Type Anes PTL Lv  2 Term 06/12/23 [redacted]w[redacted]d  7 lb 6.2 oz (3.35 kg) M CS-LTranv Spinal  LIV  1 Term 06/26/21 [redacted]w[redacted]d 171:40 6 lb 13.7 oz (3.11 kg) F Vag-Vacuum EPI  LIV    Obstetric Comments  1st Menstrual Cycle: 9   1st Pregnancy: N/A    Past Medical History:  Diagnosis Date   Abnormal weight gain 03/12/2018   Acute upper respiratory infection 09/13/2020   Anxiety    Dysuria 01/17/2020   Endometriosis 11/16/2018   External hemorrhoid 07/05/2019   Generalized abdominal pain 10/19/2018   Genetic screening 12/15/2020   Gestational diabetes    Impaired fasting glucose 03/12/2018   Other constipation 10/19/2018   Other fatigue 03/12/2018   Ovarian cyst    Pelvic pain    Vaccine for human papilloma virus (HPV) types 6, 11, 16, and 18 administered    Vitamin D  deficiency 03/12/2018    Past Surgical History:  Procedure Laterality Date   TONSILLECTOMY     WISDOM TOOTH EXTRACTION      Current Outpatient Medications on File Prior to Visit  Medication Sig Dispense Refill   citalopram  (CELEXA ) 10 MG  tablet Take 1 tablet (10 mg total) by mouth daily. 90 tablet 0   ferrous sulfate  325 (65 FE) MG tablet TAKE 1 TABLET BY MOUTH EVERY DAY (Patient not taking: Reported on 10/29/2023) 90 tablet 1   ibuprofen  (ADVIL ) 600 MG tablet Take 1 tablet (600 mg total) by mouth every 6 (six) hours. (Patient not taking: Reported on 10/29/2023) 30 tablet 0   polycarbophil (FIBERCON) 625 MG tablet Take 625 mg by mouth daily. (Patient not taking: Reported on 10/29/2023)     prenatal vitamin w/FE, FA (NATACHEW) 29-1 MG CHEW chewable tablet Chew 2 tablets by mouth daily at 12 noon. (Patient not taking: Reported on 10/29/2023)     No current facility-administered medications on file prior to visit.    No Known Allergies  Social History:  reports that she quit smoking about 9 years ago. Her smoking use included cigarettes. She started smoking about 10 years ago. She has never used smokeless tobacco. She reports that she does not currently use alcohol. She reports that she does not use drugs.  Family History  Problem Relation Age of Onset   Healthy Mother    Diabetes Father    Healthy Brother    Healthy Brother    Healthy Brother    Healthy Maternal Grandmother    Healthy Maternal Grandfather    Healthy Paternal Grandmother    Cirrhosis Paternal Grandfather  liver    The following portions of the patient's history were reviewed and updated as appropriate: allergies, current medications, past family history, past medical history, past social history, past surgical history and problem list.  Review of Systems Pertinent items noted in HPI and remainder of comprehensive ROS otherwise negative.  Physical Exam:  BP 107/71   Pulse 74   Ht 5\' 3"  (1.6 m)   Wt 264 lb 14.4 oz (120.2 kg)   LMP 10/29/2023 (Exact Date)   Breastfeeding No   BMI 46.92 kg/m  CONSTITUTIONAL: Well-developed, well-nourished female in no acute distress.  HENT:  Normocephalic, atraumatic, External right and left ear normal.  Oropharynx is clear and moist EYES: Conjunctivae and EOM are normal. Pupils are equal, round, and reactive to light. No scleral icterus.  NECK: Normal range of motion, supple, no masses.  Normal thyroid .  SKIN: Skin is warm and dry. No rash noted. Not diaphoretic. No erythema. No pallor. MUSCULOSKELETAL: Normal range of motion. No tenderness.  No cyanosis, clubbing, or edema.  2+ distal pulses. NEUROLOGIC: Alert and oriented to person, place, and time. Normal reflexes, muscle tone coordination.  PSYCHIATRIC: Normal mood and affect. Normal behavior. Normal judgment and thought content. CARDIOVASCULAR: Normal heart rate noted, regular rhythm RESPIRATORY: Clear to auscultation bilaterally. Effort and breath sounds normal, no problems with respiration noted. BREASTS: Symmetric in size. No masses, tenderness, skin changes, nipple drainage, or lymphadenopathy bilaterally.  ABDOMEN: Soft, no distention noted.  No tenderness, rebound or guarding.  PELVIC: Normal appearing external genitalia and urethral meatus; normal appearing vaginal mucosa and cervix.  No abnormal discharge noted.  Pap smear not due .  Normal uterine size, no other palpable masses, no uterine or adnexal tenderness.  .   Assessment and Plan:    1. Women's annual routine gynecological examination (Primary)  Pap: not due  Mammogram : n/a  Labs: none Refills: Semaglutide  for weight loss.  Referral: none Reviewed risk and benefits of weight loss medication. Discussed exercise and diet changes. Pt encouraged to exercise 20 min 4-5 times a week to start. She is in agreement. She denies any contraindications to medication use.  Routine preventative health maintenance measures emphasized. Please refer to After Visit Summary for other counseling recommendations.     Follow up 4 weeks for medication management   Alise Appl, CNM [facility]  [facility],  [facility]

## 2023-11-04 ENCOUNTER — Ambulatory Visit: Payer: BC Managed Care – PPO | Admitting: Dermatology

## 2023-11-27 ENCOUNTER — Ambulatory Visit: Payer: BC Managed Care – PPO | Admitting: Certified Nurse Midwife

## 2023-12-23 ENCOUNTER — Ambulatory Visit: Admitting: Dermatology

## 2023-12-23 ENCOUNTER — Other Ambulatory Visit: Payer: Self-pay | Admitting: Dermatology

## 2023-12-23 DIAGNOSIS — L0291 Cutaneous abscess, unspecified: Secondary | ICD-10-CM

## 2023-12-23 DIAGNOSIS — L02214 Cutaneous abscess of groin: Secondary | ICD-10-CM

## 2023-12-23 DIAGNOSIS — L989 Disorder of the skin and subcutaneous tissue, unspecified: Secondary | ICD-10-CM

## 2023-12-23 DIAGNOSIS — L723 Sebaceous cyst: Secondary | ICD-10-CM | POA: Diagnosis not present

## 2023-12-23 MED ORDER — CLINDAMYCIN PHOSPHATE 1 % EX SOLN
Freq: Every day | CUTANEOUS | 2 refills | Status: DC
Start: 2023-12-23 — End: 2024-07-19

## 2023-12-23 MED ORDER — FLUCONAZOLE 150 MG PO TABS: 150.0000 mg | ORAL_TABLET | Freq: Once | ORAL | 1 refills | Status: AC

## 2023-12-23 MED ORDER — DOXYCYCLINE HYCLATE 100 MG PO TABS: 100.0000 mg | ORAL_TABLET | Freq: Two times a day (BID) | ORAL | 0 refills | Status: DC

## 2023-12-23 NOTE — Progress Notes (Signed)
   Follow-Up Visit   Subjective  Hannah Stevens is a 33 y.o. female who presents for the following: irritated spot at L bikini line since Saturday, very painful. Has been doing warm compresses Sunday-Monday, not draining. Patient has had similar problem in past same area.    The following portions of the chart were reviewed this encounter and updated as appropriate: medications, allergies, medical history  Review of Systems:  No other skin or systemic complaints except as noted in HPI or Assessment and Plan.  Objective  Well appearing patient in no apparent distress; mood and affect are within normal limits.   A focused examination was performed of the following areas: L Inguinal crease  Relevant exam findings are noted in the Assessment and Plan.  L labia majora at inguinal crease Fluctuant tender SQ nodule ~3 cm  Assessment & Plan   ABSCESS L labia majora at inguinal crease Symptomatic, painful, patient would like treated.  Recommend I&D today  Bacterial Culture obtained and sent to lab, will call patient with results.   Take Doxycyline 100mg  BID with food for two weeks then go down to once a day until gone.   Doxycycline should be taken with food to prevent nausea. Do not lay down for 30 minutes after taking. Be cautious with sun exposure and use good sun protection while on this medication. Pregnant women should not take this medication.   Use clindamycin 1% external solution, apply topically daily  Recommend OTC benzoyl peroxide cleanser, wash affected areas daily in shower, let sit several minutes prior to rinsing.  May bleach towels if not rinsed off completely.  Recommended brands include Panoxyl 4% Creamy Wash, CeraVe Acne Foaming Cream wash, or Cetaphil Gentle Clear Complexion-Clearing BPO Acne Cleanser.   If she continues to get similar flares, she may have hidradenitis Incision and Drainage - L labia majora at inguinal crease Location: L labia majora at inguinal  crease  Informed Consent: Discussed risks (permanent scarring, light or dark discoloration, infection, pain, bleeding, bruising, redness, damage to adjacent structures, and recurrence of the lesion) and benefits of the procedure, as well as the alternatives.  Informed consent was obtained.  Preparation: The area was prepped with alcohol.  Anesthesia: Lidocaine 1% with epinephrine  Procedure Details: An incision was made overlying the lesion. The lesion drained pus and blood.  A large amount of fluid was drained.    Antibiotic ointment and a sterile pressure dressing were applied. The patient tolerated procedure well.  Total number of lesions drained: 1  Plan: The patient was instructed on post-op care. Recommend OTC analgesia as needed for pain.   Aerobic culture - L labia majora at inguinal crease  Return if symptoms worsen or fail to improve.  I, Soundra Pilon, CMA, am acting as scribe for Willeen Niece, MD .   Documentation: I have reviewed the above documentation for accuracy and completeness, and I agree with the above.  Willeen Niece, MD

## 2023-12-23 NOTE — Patient Instructions (Addendum)
 Take Doxycyline 100mg  BID with food for two weeks then go down to once a day.   Doxycycline should be taken with food to prevent nausea. Do not lay down for 30 minutes after taking. Be cautious with sun exposure and use good sun protection while on this medication. Pregnant women should not take this medication.    Due to recent changes in healthcare laws, you may see results of your pathology and/or laboratory studies on MyChart before the doctors have had a chance to review them. We understand that in some cases there may be results that are confusing or concerning to you. Please understand that not all results are received at the same time and often the doctors may need to interpret multiple results in order to provide you with the best plan of care or course of treatment. Therefore, we ask that you please give [place] 2 business days to thoroughly review all your results before contacting the office for clarification. Should we see a critical lab result, you will be contacted sooner.   If You Need Anything After Your Visit  If you have any questions or concerns for your doctor, please call our main line at [phone] and press option 4 to reach your doctor's medical assistant. If no one answers, please leave a voicemail as directed and we will return your call as soon as possible. Messages left after 4 pm will be answered the following business day.   You may also send [place] a message via MyChart. We typically respond to MyChart messages within 1-2 business days.  For prescription refills, please ask your pharmacy to contact our office. Our fax number is [phone].  If you have an urgent issue when the clinic is closed that cannot wait until the next business day, you can page your doctor at the number below.    Please note that while we do our best to be available for urgent issues outside of office hours, we are not available 24/7.   If you have an urgent issue and are unable to reach [place], you  may choose to seek medical care at your doctor's office, retail clinic, urgent care center, or emergency room.  If you have a medical emergency, please immediately call 911 or go to the emergency department.  Pager Numbers  - Dr. Gwen Pounds: [phone]  - Dr. Roseanne Reno: [phone]  - Dr. Katrinka Blazing: [phone]   In the event of inclement weather, please call our main line at [phone] for an update on the status of any delays or closures.  Dermatology Medication Tips: Please keep the boxes that topical medications come in in order to help keep track of the instructions about where and how to use these. Pharmacies typically print the medication instructions only on the boxes and not directly on the medication tubes.   If your medication is too expensive, please contact our office at [phone] option 4 or send [place] a message through MyChart.   We are unable to tell what your co-pay for medications will be in advance as this is different depending on your insurance coverage. However, we may be able to find a substitute medication at lower cost or fill out paperwork to get insurance to cover a needed medication.   If a prior authorization is required to get your medication covered by your insurance company, please allow [place] 1-2 business days to complete this process.  Drug prices often vary depending on where the prescription is filled and some pharmacies may offer cheaper prices.  The website www.goodrx.com contains coupons for medications through different pharmacies. The prices here do not account for what the cost may be with help from insurance (it may be cheaper with your insurance), but the website can give you the price if you did not use any insurance.  - You can print the associated coupon and take it with your prescription to the pharmacy.  - You may also stop by our office during regular business hours and pick up a GoodRx coupon card.  - If you need your prescription sent  electronically to a different pharmacy, notify our office through [facility] or by phone at [phone] option 4.     Si Usted Necesita Algo Despus de Su Visita  Tambin puede enviarnos un mensaje a travs de Clinical cytogeneticist. Por lo general respondemos a los mensajes de MyChart en el transcurso de 1 a 2 das hbiles.  Para renovar recetas, por favor pida a su farmacia que se ponga en contacto con nuestra oficina. Annie Sable de fax es [place] [phone].  Si tiene un asunto urgente cuando la clnica est cerrada y que no puede esperar hasta el siguiente da hbil, puede llamar/localizar a su doctor(a) al nmero que aparece a continuacin.   Por favor, tenga en cuenta que aunque hacemos todo lo posible para estar disponibles para asuntos urgentes fuera del horario de [place], no estamos disponibles las 24 horas del da, los 7 [address]  [address] de la [place].   Si tiene un problema urgente y no puede comunicarse con nosotros, puede optar por buscar atencin mdica  en el consultorio de su doctor(a), en una clnica privada, en un centro de atencin urgente o en una sala de emergencias.  Si tiene Engineer, drilling, por favor llame inmediatamente al 911 o vaya a la sala de emergencias.  Nmeros de bper  - Dr. Gwen Pounds: [phone]  - Dra. Roseanne Reno: [phone]  - Dr. Katrinka Blazing: [phone]   En caso de inclemencias del tiempo, por favor llame a Lacy Duverney principal al [phone] para una actualizacin sobre el [place] de cualquier retraso o cierre.  Consejos para la medicacin en dermatologa: Por favor, guarde las cajas en las que vienen los medicamentos de uso tpico para ayudarle a seguir las instrucciones sobre dnde y cmo usarlos. Las farmacias generalmente imprimen las instrucciones del medicamento slo en las cajas y no directamente en los tubos del [place].   Si su medicamento es muy caro, por favor, pngase en contacto con Rolm Gala llamando al [phone] y presione la  opcin 4 o envenos un mensaje a travs de Clinical cytogeneticist.   No podemos decirle cul ser su copago por los medicamentos por adelantado ya que esto es diferente dependiendo de la cobertura de su seguro. Sin embargo, es posible que podamos encontrar un medicamento sustituto a Audiological scientist un formulario para que el seguro cubra el medicamento que se considera necesario.   Si se requiere una autorizacin previa para que su compaa de seguros [place] su medicamento, por favor permtanos de 1 a 2 das hbiles para completar [address]  Los precios de los medicamentos varan con frecuencia dependiendo del Environmental consultant de dnde se surte la receta y alguna farmacias pueden ofrecer precios ms baratos.  El sitio web www.goodrx.com tiene cupones para medicamentos de Health and safety inspector. Los precios aqu no tienen en cuenta lo que podra costar con la ayuda del seguro (puede ser ms barato con su seguro), pero el sitio web puede darle el precio si no utiliz Tourist information centre manager.  -  Puede imprimir el cupn correspondiente y llevarlo con su receta a la farmacia.  - Tambin puede pasar por nuestra oficina durante el horario de atencin regular y Education officer, museum una tarjeta de cupones de GoodRx.  - Si necesita que su receta se enve electrnicamente a una farmacia diferente, informe a nuestra oficina a travs de MyChart de [place] o por telfono llamando al [phone] y presione la opcin 4.

## 2023-12-25 LAB — AEROBIC CULTURE

## 2023-12-25 LAB — SPECIMEN STATUS REPORT

## 2023-12-29 ENCOUNTER — Telehealth: Payer: Self-pay

## 2023-12-29 NOTE — Telephone Encounter (Signed)
-----   Message from Willeen Niece sent at 12/29/2023 12:54 PM EDT ----- Culture negative - please call patient

## 2023-12-29 NOTE — Telephone Encounter (Signed)
Advised pt of culture results/sh

## 2024-01-10 ENCOUNTER — Other Ambulatory Visit: Payer: Self-pay | Admitting: Obstetrics and Gynecology

## 2024-01-19 ENCOUNTER — Other Ambulatory Visit: Payer: Self-pay | Admitting: Dermatology

## 2024-04-14 ENCOUNTER — Other Ambulatory Visit: Payer: Self-pay | Admitting: Certified Nurse Midwife

## 2024-04-20 ENCOUNTER — Encounter: Payer: Self-pay | Admitting: Certified Nurse Midwife

## 2024-05-25 DIAGNOSIS — R051 Acute cough: Secondary | ICD-10-CM | POA: Diagnosis not present

## 2024-05-25 DIAGNOSIS — J159 Unspecified bacterial pneumonia: Secondary | ICD-10-CM | POA: Diagnosis not present

## 2024-05-25 DIAGNOSIS — R0981 Nasal congestion: Secondary | ICD-10-CM | POA: Diagnosis not present

## 2024-07-19 ENCOUNTER — Ambulatory Visit

## 2024-07-19 DIAGNOSIS — L0291 Cutaneous abscess, unspecified: Secondary | ICD-10-CM | POA: Diagnosis not present

## 2024-07-19 DIAGNOSIS — L732 Hidradenitis suppurativa: Secondary | ICD-10-CM

## 2024-07-19 MED ORDER — DOXYCYCLINE MONOHYDRATE 100 MG PO CAPS: ORAL_CAPSULE | ORAL | 3 refills | Status: AC

## 2024-07-19 MED ORDER — CLINDAMYCIN PHOSPHATE 1 % EX SWAB: CUTANEOUS | 3 refills | Status: AC

## 2024-07-19 NOTE — Progress Notes (Signed)
   Follow-Up Visit   Subjective  Hannah Stevens is a 33 y.o. female who presents for the following: abscess of the vaginal/buttocks area - painful, present since Saturday, has been using warm compress, tried squeezing it, but would not pop, started Doxycycline  100 mg po BID yesterday, has 5 left. Pt here today for tx. Hx of abscess previously tx with I&D and Doxycycline . Cultures negative in past   The following portions of the chart were reviewed this encounter and updated as appropriate: medications, allergies, medical history  Review of Systems:  No other skin or systemic complaints except as noted in HPI or Assessment and Plan.  Objective  Well appearing patient in no apparent distress; mood and affect are within normal limits.   A focused examination was performed of the following areas: the groin - R inguinal crease with 3-4 cm inflamed fluctuant plaque  - scarring in groin noted    Relevant exam findings are noted in the Assessment and Plan.  Pubic   Assessment & Plan   Hidradenitis suppurativa, [place] stage 2  Chronic and persistent condition with duration or expected duration over one year. Condition is symptomatic and bothersome to patient. Patient is flaring and not currently at treatment goal.  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 treated with I&D and PO abx in past  - Discussed the chronic, relapsing nature of this skin disease, characterized by recurring inflamed painful nodules with abscess and sinus formation, and scarring - Explained to patient that early, isolated lesions can remit with medical treatment, but once sinus tracts are established treatment options are limited and surgery is the only definitive treatment; however, recurrence rate can be up to 25% even with surgery - Start topical clindamycin  1% swabs daily to groin - Start hibiclens  wash daily  - Start doxycycline  100 mg BID x 14 days - repeat as needed for flares - Lesion of R inguinal  creased I&D'ed today - procedure note below  - aerobe/anaerobe swab obtained  - Discussed other treatments including OCP (does not want to start this), spiro, biologics, laser hair if not improved on above   ABSCESS Pubic   Incision and Drainage - Pubic Location: R inguinal crease   Informed Consent: Discussed risks (permanent scarring, light or dark discoloration, infection, pain, bleeding, bruising, redness, damage to adjacent structures, and recurrence of the lesion) and benefits of the procedure, as well as the alternatives.  Informed consent was obtained.  Preparation: The area was prepped with alcohol.  Anesthesia: Lidocaine  1% with epinephrine   Procedure Details: An incision was made overlying the lesion.   A moderate amount of fluid was drained.    Antibiotic ointment and a sterile pressure dressing were applied. The patient tolerated procedure well.  Total number of lesions drained: 1  Plan: The patient was instructed on post-op care. Recommend OTC analgesia as needed for pain.    Return for HS follow up in 4-6 mths.  Hannah Stevens, CMA, am acting as scribe for Lauraine JAYSON Kanaris, MD .   Documentation: I have reviewed the above documentation for accuracy and completeness, and I agree with the above.  Lauraine JAYSON Kanaris, MD

## 2024-07-19 NOTE — Patient Instructions (Addendum)
 Recommend Hibiclens  wash daily in the shower.    Due to recent changes in healthcare laws, you may see results of your pathology and/or laboratory studies on MyChart before the doctors have had a chance to review them. We understand that in some cases there may be results that are confusing or concerning to you. Please understand that not all results are received at the same time and often the doctors may need to interpret multiple results in order to provide you with the best plan of care or course of treatment. Therefore, we ask that you please give us  2 business days to thoroughly review all your results before contacting the office for clarification. Should we see a critical lab result, you will be contacted sooner.   If You Need Anything After Your Visit  If you have any questions or concerns for your doctor, please call our main line at [phone] and press option 4 to reach your doctor's medical assistant. If no one answers, please leave a voicemail as directed and we will return your call as soon as possible. Messages left after 4 pm will be answered the following business day.   You may also send us  a message via MyChart. We typically respond to MyChart messages within 1-2 business days.  For prescription refills, please ask your pharmacy to contact our office. Our fax number is [phone].  If you have an urgent issue when the clinic is closed that cannot wait until the next business day, you can page your doctor at the number below.    Please note that while we do our best to be available for urgent issues outside of office hours, we are not available 24/7.   If you have an urgent issue and are unable to reach us , you may choose to seek medical care at your doctor's office, retail clinic, urgent care center, or emergency room.  If you have a medical emergency, please immediately call 911 or go to the emergency department.  Pager Numbers  - Dr. Hester: [phone]  - Dr.  Jackquline: [phone]  - Dr. Claudene: [phone]   - Dr. Raymund: [phone]  In the event of inclement weather, please call our main line at [phone] for an update on the status of any delays or closures.  Dermatology Medication Tips: Please keep the boxes that topical medications come in in order to help keep track of the instructions about where and how to use these. Pharmacies typically print the medication instructions only on the boxes and not directly on the medication tubes.   If your medication is too expensive, please contact our office at [phone] option 4 or send us  a message through MyChart.   We are unable to tell what your co-pay for medications will be in advance as this is different depending on your insurance coverage. However, we may be able to find a substitute medication at lower cost or fill out paperwork to get insurance to cover a needed medication.   If a prior authorization is required to get your medication covered by your insurance company, please allow us  1-2 business days to complete this process.  Drug prices often vary depending on where the prescription is filled and some pharmacies may offer cheaper prices.  The website www.goodrx.com contains coupons for medications through different pharmacies. The prices here do not account for what the cost may be with help from insurance (it may be cheaper with your insurance), but the website can give you the price if you did  not use any insurance.  - You can print the associated coupon and take it with your prescription to the pharmacy.  - You may also stop by our office during regular business hours and pick up a GoodRx coupon card.  - If you need your prescription sent electronically to a different pharmacy, notify our office through [facility] or by phone at [phone] option 4.     Si Usted Necesita Algo Despus de Su Visita  Tambin puede enviarnos un mensaje a travs de Clinical cytogeneticist. Por lo  general respondemos a los mensajes de MyChart en el transcurso de 1 a 2 das hbiles.  Para renovar recetas, por favor pida a su farmacia que se ponga en contacto con nuestra oficina. Randi lakes de fax es [place] [phone].  Si tiene un asunto urgente cuando la clnica est cerrada y que no puede esperar hasta el siguiente da hbil, puede llamar/localizar a su doctor(a) al nmero que aparece a continuacin.   Por favor, tenga en cuenta que aunque hacemos todo lo posible para estar disponibles para asuntos urgentes fuera del horario de [place], no estamos disponibles las 24 horas del da, los 7 [address]  [address] de la [place].   Si tiene un problema urgente y no puede comunicarse con nosotros, puede optar por buscar atencin mdica  en el consultorio de su doctor(a), en una clnica privada, en un centro de atencin urgente o en una sala de emergencias.  Si tiene Engineer, drilling, por favor llame inmediatamente al 911 o vaya a la sala de emergencias.  Nmeros de bper  - Dr. Hester: [phone]  - Dra. Jackquline: [phone]  - Dr. Claudene: [phone]  - Dra. Kitts: [phone]  En caso de inclemencias del [place], por favor llame a nuestra lnea principal al [phone] para una actualizacin sobre el estado de cualquier retraso o cierre.  Consejos para la medicacin en dermatologa: Por favor, guarde las cajas en las que vienen los medicamentos de uso tpico para ayudarle a seguir las instrucciones sobre dnde y cmo usarlos. Las farmacias generalmente imprimen las instrucciones del medicamento slo en las cajas y no directamente en los tubos del [place].   Si su medicamento es muy caro, por favor, pngase en contacto con landry rieger llamando al [phone] y presione la opcin 4 o envenos un mensaje a travs de Clinical cytogeneticist.   No podemos decirle cul ser su copago por los medicamentos por adelantado ya que esto es diferente dependiendo de la cobertura de su seguro. Sin embargo, es  posible que podamos encontrar un medicamento sustituto a Audiological scientist un formulario para que el seguro cubra el medicamento que se considera necesario.   Si se requiere una autorizacin previa para que su compaa de seguros [place] su medicamento, por favor permtanos de 1 a 2 das hbiles para completar este proceso.  Los precios de los medicamentos varan con frecuencia dependiendo del Environmental consultant de dnde se surte la receta y alguna farmacias pueden ofrecer precios ms baratos.  El sitio web www.goodrx.com tiene cupones para medicamentos de Health and safety inspector. Los precios aqu no tienen en cuenta lo que podra costar con la ayuda del seguro (puede ser ms barato con su seguro), pero el sitio web puede darle el precio si no utiliz Tourist information centre manager.  - Puede imprimir el cupn correspondiente y llevarlo con su receta a la farmacia.  - Tambin puede pasar por nuestra oficina durante el horario de atencin regular y Education officer, museum una tarjeta de cupones de GoodRx.  - Si necesita  que su receta se enve electrnicamente a Psychiatrist, informe a nuestra oficina a travs de MyChart de [place] o por telfono llamando al [phone] y presione la opcin 4.

## 2024-07-19 NOTE — Addendum Note (Signed)
 Addended by: WILLMA KNEE A on: 07/19/2024 10:55 AM   Modules accepted: Orders

## 2024-07-21 ENCOUNTER — Other Ambulatory Visit: Payer: Self-pay | Admitting: Certified Nurse Midwife

## 2024-07-25 LAB — ANAEROBIC AND AEROBIC CULTURE

## 2024-07-26 ENCOUNTER — Ambulatory Visit: Payer: Self-pay

## 2024-07-26 NOTE — Telephone Encounter (Signed)
-----   Message from Lauraine JAYSON Kanaris sent at 07/26/2024  8:43 AM EDT ----- Please notify patient with below plan: Swab - Comment: Mixed anaerobic organisms, none predominating. Light growth   Continue current plan as discussed at last visit  ----- Message ----- From: Interface, [facility] Lab Results In Sent: 07/25/2024   7:35 AM EDT To: Lauraine JAYSON Kanaris, MD

## 2024-07-26 NOTE — Telephone Encounter (Signed)
 Patient advised.

## 2024-08-06 DIAGNOSIS — F4323 Adjustment disorder with mixed anxiety and depressed mood: Secondary | ICD-10-CM | POA: Diagnosis not present

## 2024-08-16 DIAGNOSIS — F4323 Adjustment disorder with mixed anxiety and depressed mood: Secondary | ICD-10-CM | POA: Diagnosis not present

## 2024-08-23 DIAGNOSIS — F4323 Adjustment disorder with mixed anxiety and depressed mood: Secondary | ICD-10-CM | POA: Diagnosis not present

## 2024-09-16 DIAGNOSIS — F4323 Adjustment disorder with mixed anxiety and depressed mood: Secondary | ICD-10-CM | POA: Diagnosis not present

## 2024-09-30 DIAGNOSIS — F4323 Adjustment disorder with mixed anxiety and depressed mood: Secondary | ICD-10-CM | POA: Diagnosis not present

## 2024-10-08 ENCOUNTER — Telehealth: Payer: Self-pay

## 2024-10-08 NOTE — Telephone Encounter (Signed)
 Pt called that she is positive  for Flu A at home advised her as per dr fernand to go to urgent care

## 2024-10-09 ENCOUNTER — Telehealth: Admitting: Nurse Practitioner

## 2024-10-09 DIAGNOSIS — J101 Influenza due to other identified influenza virus with other respiratory manifestations: Secondary | ICD-10-CM | POA: Diagnosis not present

## 2024-10-09 MED ORDER — OSELTAMIVIR PHOSPHATE 75 MG PO CAPS: 75.0000 mg | ORAL_CAPSULE | Freq: Two times a day (BID) | ORAL | 0 refills | Status: AC

## 2024-10-09 MED ORDER — PREDNISONE 10 MG PO TABS: 10.0000 mg | ORAL_TABLET | Freq: Every day | ORAL | 0 refills | Status: AC

## 2024-10-09 MED ORDER — PROMETHAZINE-DM 6.25-15 MG/5ML PO SYRP: 5.0000 mL | ORAL_SOLUTION | Freq: Four times a day (QID) | ORAL | 0 refills | Status: AC | PRN

## 2024-10-09 NOTE — Patient Instructions (Signed)
 " Hannah Stevens, thank you for joining Hannah LELON Servant, Hannah Stevens for today's virtual visit.  While this provider is not your primary care provider (PCP), if your PCP is located in our provider database this encounter information will be shared with them immediately following your visit.   A [facility] MyChart account gives you access to today's visit and all your visits, tests, and labs performed at [facility]  click here if you don't have a [facility] MyChart account or go to [email or web address]  Consent: (Patient) Hannah Stevens provided verbal consent for this virtual visit at the beginning of the encounter.  Current Medications:  Current Outpatient Medications:    oseltamivir  (TAMIFLU ) 75 MG capsule, Take 1 capsule (75 mg total) by mouth 2 (two) times daily for 5 days., Disp: 10 capsule, Rfl: 0   predniSONE  (DELTASONE ) 10 MG tablet, Take 1 tablet (10 mg total) by mouth daily with breakfast for 5 days., Disp: 5 tablet, Rfl: 0   promethazine -dextromethorphan (PROMETHAZINE -DM) 6.25-15 MG/5ML syrup, Take 5 mLs by mouth 4 (four) times daily as needed., Disp: 240 mL, Rfl: 0   citalopram  (CELEXA ) 10 MG tablet, TAKE 1 TABLET BY MOUTH EVERY DAY, Disp: 90 tablet, Rfl: 0   clindamycin  (CLEOCIN  T) 1 % SWAB, Apply to aa QD, Disp: 60 each, Rfl: 3   doxycycline  (MONODOX ) 100 MG capsule, Take one cap po BID x 2 weeks as needed for flares. Take with food and plenty of drink., Disp: 28 capsule, Rfl: 3   doxycycline  (VIBRA -TABS) 100 MG tablet, TAKE 1 TABLET BY MOUTH TWICE A DAY, Disp: 60 tablet, Rfl: 0   Semaglutide -Weight Management 0.25 MG/0.5ML SOAJ, Inject 0.25 mg into the skin once a week., Disp: 2 mL, Rfl: 0   Medications ordered in this encounter:  Meds ordered this encounter  Medications   oseltamivir  (TAMIFLU ) 75 MG capsule    Sig: Take 1 capsule (75 mg total) by mouth 2 (two) times daily for 5 days.    Dispense:  10 capsule    Refill:  0    Supervising Provider:   LAMPTEY, PHILIP  O [[id number]]   promethazine -dextromethorphan (PROMETHAZINE -DM) 6.25-15 MG/5ML syrup    Sig: Take 5 mLs by mouth 4 (four) times daily as needed.    Dispense:  240 mL    Refill:  0    Supervising Provider:   BLAISE ALEENE KIDD [id number]   predniSONE  (DELTASONE ) 10 MG tablet    Sig: Take 1 tablet (10 mg total) by mouth daily with breakfast for 5 days.    Dispense:  5 tablet    Refill:  0    Supervising Provider:   BLAISE ALEENE KIDD [[id number]]     *If you need refills on other medications prior to your next appointment, please contact your pharmacy*  Follow-Up: Call back or seek an in-person evaluation if the symptoms worsen or if the condition fails to improve as anticipated.  [facility] [phone]  Other Instructions INSTRUCTIONS: use a humidifier for nasal congestion Drink plenty of fluids, rest and wash hands frequently to avoid the spread of infection Alternate tylenol  and Motrin  for relief of fever    If you have been instructed to have an in-person evaluation today at a local Urgent Care facility, please use the link below. It will take you to a list of all of our available [facility], including address, phone number and hours of operation. Please do not delay care.  [facility]  If you or a family member do not have a primary care provider, use the link below to schedule a visit and establish care. When you choose a [facility] primary care physician or advanced practice provider, you gain a long-term partner in health. Find a Primary Care Provider  Learn more about [facility]'s in-office and virtual care options: [facility] - Get Care Now  "

## 2024-10-09 NOTE — Progress Notes (Signed)
 " Virtual Visit Consent   Hannah Stevens, you are scheduled for a virtual visit with a [facility] provider today. Just as with appointments in the office, your consent must be obtained to participate. Your consent will be active for this visit and any virtual visit you may have with one of our providers in the next 365 days. If you have a MyChart account, a copy of this consent can be sent to you electronically.  As this is a virtual visit, video technology does not allow for your provider to perform a traditional examination. This may limit your provider's ability to fully assess your condition. If your provider identifies any concerns that need to be evaluated in person or the need to arrange testing (such as labs, EKG, etc.), we will make arrangements to do so. Although advances in technology are sophisticated, we cannot ensure that it will always work on either your end or our end. If the connection with a video visit is poor, the visit may have to be switched to a telephone visit. With either a video or telephone visit, we are not always able to ensure that we have a secure connection.  By engaging in this virtual visit, you consent to the provision of healthcare and authorize for your insurance to be billed (if applicable) for the services provided during this visit. Depending on your insurance coverage, you may receive a charge related to this service.  I need to obtain your verbal consent now. Are you willing to proceed with your visit today? Hannah Stevens has provided verbal consent on 10/09/2024 for a virtual visit (video or telephone). Hannah LELON Servant, NP  Date: 10/09/2024 8:42 AM   Virtual Visit via Video Note   I, Hannah Stevens, connected with  Hannah Stevens  ([id number], 08/25/1991) on 10/09/2024 at  8:45 AM EST by a video-enabled telemedicine application and verified that I am speaking with the correct person using two identifiers.  Location: Patient: Virtual Visit Location Patient:  Home Provider: Virtual Visit Location Provider: Home Office   I discussed the limitations of evaluation and management by telemedicine and the availability of in person appointments. The patient expressed understanding and agreed to proceed.    History of Present Illness: Hannah Stevens is a 33 y.o. who identifies as a female who was assigned female at birth, and is being seen today for FLU A.  Hannah Stevens tested positive for influenza A via home test yesterday.  Her symptoms started Thursday 2 days ago and currently include fatigue, sinus (maxillary and frontal ) pressure, congestion, cough and malaise.  She is currently taking Mucinex cold and flu, Zicam and Oscillococcinum for Relief from Flu-Like Symptoms .  Problems:  Patient Active Problem List   Diagnosis Date Noted   S/P cesarean section 06/19/2023   Anemia, postpartum 06/13/2023   Labor and delivery, indication for care 06/12/2023   Unstable fetal lie 06/12/2023   Failed external cephalic version 91/70/7975   [redacted] weeks gestation of pregnancy 06/06/2023   Gestational diabetes mellitus (GDM) requiring insulin  05/20/2023   Polyhydramnios affecting pregnancy in third trimester 04/07/2023   Obesity affecting pregnancy in third trimester 12/20/2022   Supervision of high risk pregnancy, antepartum 11/22/2022    Allergies: Allergies[1] Medications: Current Medications[2]  Observations/Objective: Patient is well-developed, well-nourished in no acute distress.  Resting comfortably at home.  Head is normocephalic, atraumatic.  No labored breathing.  Speech is clear and coherent with logical content.  Patient is alert and oriented  at baseline.    Assessment and Plan: 1. Influenza A (Primary) - oseltamivir  (TAMIFLU ) 75 MG capsule; Take 1 capsule (75 mg total) by mouth 2 (two) times daily for 5 days.  Dispense: 10 capsule; Refill: 0 - promethazine -dextromethorphan (PROMETHAZINE -DM) 6.25-15 MG/5ML syrup; Take 5 mLs by mouth 4 (four)  times daily as needed.  Dispense: 240 mL; Refill: 0 - predniSONE  (DELTASONE ) 10 MG tablet; Take 1 tablet (10 mg total) by mouth daily with breakfast for 5 days.  Dispense: 5 tablet; Refill: 0  INSTRUCTIONS: use a humidifier for nasal congestion Drink plenty of fluids, rest and wash hands frequently to avoid the spread of infection Alternate tylenol  and Motrin  for relief of fever   Follow Up Instructions: I discussed the assessment and treatment plan with the patient. The patient was provided an opportunity to ask questions and all were answered. The patient agreed with the plan and demonstrated an understanding of the instructions.  A copy of instructions were sent to the patient via MyChart unless otherwise noted below.     The patient was advised to call back or seek an in-person evaluation if the symptoms worsen or if the condition fails to improve as anticipated.    Hannah LELON Servant, NP     [1] No Known Allergies [2]  Current Outpatient Medications:    oseltamivir  (TAMIFLU ) 75 MG capsule, Take 1 capsule (75 mg total) by mouth 2 (two) times daily for 5 days., Disp: 10 capsule, Rfl: 0   predniSONE  (DELTASONE ) 10 MG tablet, Take 1 tablet (10 mg total) by mouth daily with breakfast for 5 days., Disp: 5 tablet, Rfl: 0   promethazine -dextromethorphan (PROMETHAZINE -DM) 6.25-15 MG/5ML syrup, Take 5 mLs by mouth 4 (four) times daily as needed., Disp: 240 mL, Rfl: 0   citalopram  (CELEXA ) 10 MG tablet, TAKE 1 TABLET BY MOUTH EVERY DAY, Disp: 90 tablet, Rfl: 0   clindamycin  (CLEOCIN  T) 1 % SWAB, Apply to aa QD, Disp: 60 each, Rfl: 3   doxycycline  (MONODOX ) 100 MG capsule, Take one cap po BID x 2 weeks as needed for flares. Take with food and plenty of drink., Disp: 28 capsule, Rfl: 3   doxycycline  (VIBRA -TABS) 100 MG tablet, TAKE 1 TABLET BY MOUTH TWICE A DAY, Disp: 60 tablet, Rfl: 0   Semaglutide -Weight Management 0.25 MG/0.5ML SOAJ, Inject 0.25 mg into the skin once a week., Disp: 2 mL, Rfl:  0  "

## 2024-10-28 ENCOUNTER — Other Ambulatory Visit: Payer: Self-pay | Admitting: Certified Nurse Midwife

## 2024-11-17 NOTE — Progress Notes (Unsigned)
 "        GYNECOLOGY ANNUAL PREVENTATIVE CARE ENCOUNTER NOTE  History:     Hannah Stevens is a 34 y.o. G48P2002 female here for a routine annual gynecologic exam.  Current complaints: discuss change in menstrual cycle and discuss contraception options.  Pt has spotting for several days before her cycle and bleeding is heavier.   Denies abnormal vaginal bleeding, discharge, pelvic pain, problems with intercourse or other gynecologic concerns.     Social Relationship:married Living:husband and two children Work:full time Exercise:not active Smoke/Alcohol/drug use:No/No/Never  Gynecologic History Patient's last menstrual period was 11/15/2024 (exact date). Contraception: tubal ligation Last Pap: 11/05/21. Results were: normal with negative HPV Last mammogram: N/A. Results were: N/A  Obstetric History OB History  Gravida Para Term Preterm AB Living  2 2 2  0 0 2  SAB IAB Ectopic Multiple Live Births  0 0 0 0 2    # Outcome Date GA Lbr Len/2nd Weight Sex Type Anes PTL Lv  2 Term 06/12/23 [redacted]w[redacted]d  7 lb 6.2 oz (3.35 kg) M CS-LTranv Spinal  LIV  1 Term 06/26/21 [redacted]w[redacted]d 171:40 6 lb 13.7 oz (3.11 kg) F Vag-Vacuum EPI  LIV    Obstetric Comments  1st Menstrual Cycle: 9   1st Pregnancy: N/A    Past Medical History:  Diagnosis Date   Abnormal weight gain 03/12/2018   Acute upper respiratory infection 09/13/2020   Anxiety    Dysuria 01/17/2020   Endometriosis 11/16/2018   External hemorrhoid 07/05/2019   Generalized abdominal pain 10/19/2018   Genetic screening 12/15/2020   Gestational diabetes    Impaired fasting glucose 03/12/2018   Other constipation 10/19/2018   Other fatigue 03/12/2018   Ovarian cyst    Pelvic pain    Vaccine for human papilloma virus (HPV) types 6, 11, 16, and 18 administered    Vitamin D  deficiency 03/12/2018    Past Surgical History:  Procedure Laterality Date   TONSILLECTOMY     WISDOM TOOTH EXTRACTION      Medications Ordered Prior to  Encounter[1]  Allergies[2]  Social History:  reports that she quit smoking about 10 years ago. Her smoking use included cigarettes. She started smoking about 11 years ago. She has never used smokeless tobacco. She reports that she does not currently use alcohol. She reports that she does not use drugs.  Family History  Problem Relation Age of Onset   Healthy Mother    Diabetes Father    Healthy Brother    Healthy Brother    Healthy Brother    Healthy Maternal Grandmother    Healthy Maternal Grandfather    Healthy Paternal Grandmother    Cirrhosis Paternal Grandfather        liver    The following portions of the patient's history were reviewed and updated as appropriate: allergies, current medications, past family history, past medical history, past social history, past surgical history and problem list.  Review of Systems Pertinent items noted in HPI and remainder of comprehensive ROS otherwise negative.  Physical Exam:  BP (!) 148/85   Pulse 76   Ht 5' 3 (1.6 m)   Wt 220 lb 8 oz (100 kg)   LMP 11/15/2024 (Exact Date)   Breastfeeding No   BMI 39.06 kg/m  CONSTITUTIONAL: Well-developed, well-nourished female in no acute distress.  HENT:  Normocephalic, atraumatic, External right and left ear normal. Oropharynx is clear and moist EYES: Conjunctivae and EOM are normal. Pupils are equal, round, and reactive to light. No  scleral icterus.  NECK: Normal range of motion, supple, no masses.  Normal thyroid .  SKIN: Skin is warm and dry. No rash noted. Not diaphoretic. No erythema. No pallor. MUSCULOSKELETAL: Normal range of motion. No tenderness.  No cyanosis, clubbing, or edema.  2+ distal pulses. NEUROLOGIC: Alert and oriented to person, place, and time. Normal reflexes, muscle tone coordination.  PSYCHIATRIC: Normal mood and affect. Normal behavior. Normal judgment and thought content. CARDIOVASCULAR: Normal heart rate noted, regular rhythm RESPIRATORY: Clear to auscultation  bilaterally. Effort and breath sounds normal, no problems with respiration noted. BREASTS: Symmetric in size. No masses, tenderness, skin changes, nipple drainage, or lymphadenopathy bilaterally.  ABDOMEN: Soft, no distention noted.  No tenderness, rebound or guarding.  PELVIC: pt declines , she is on her period. She denies any concerns and she is not due for pap smear.  Assessment and Plan:    1. Women's annual routine gynecological examination (Primary)  Pap: not due Mammogram : n/a  Labs: none Refills: Discussed BC options for bleeding control. Encouraged progestin only options. She is requesting to use the OCP. 3 months ordered. Will refill for year if BP check normal. Discussed will do trial with rpt bp in 2 months .  Referral: none  Routine preventative health maintenance measures emphasized. Please refer to After Visit Summary for other counseling recommendations.      Zelda Hummer, CNM [facility]  [facility],  [facility]      [1]  Current Outpatient Medications on File Prior to Visit  Medication Sig Dispense Refill   citalopram  (CELEXA ) 10 MG tablet TAKE 1 TABLET BY MOUTH EVERY DAY 90 tablet 0   clindamycin  (CLEOCIN  T) 1 % SWAB Apply to aa QD 60 each 3   doxycycline  (MONODOX ) 100 MG capsule Take one cap po BID x 2 weeks as needed for flares. Take with food and plenty of drink. 28 capsule 3   doxycycline  (VIBRA -TABS) 100 MG tablet TAKE 1 TABLET BY MOUTH TWICE A DAY 60 tablet 0   SEMAGLUTIDE  Mentor Inject 55units weekly     Semaglutide -Weight Management 0.25 MG/0.5ML SOAJ Inject 0.25 mg into the skin once a week. 2 mL 0   promethazine -dextromethorphan (PROMETHAZINE -DM) 6.25-15 MG/5ML syrup Take 5 mLs by mouth 4 (four) times daily as needed. (Patient not taking: Reported on 11/18/2024) 240 mL 0   No current facility-administered medications on file prior to visit.  [2] No Known Allergies  "

## 2024-11-18 ENCOUNTER — Ambulatory Visit: Admitting: Certified Nurse Midwife

## 2024-11-18 ENCOUNTER — Encounter: Payer: Self-pay | Admitting: Certified Nurse Midwife

## 2024-11-18 VITALS — BP 148/85 | HR 76 | Ht 63.0 in | Wt 220.5 lb

## 2024-11-18 DIAGNOSIS — Z01419 Encounter for gynecological examination (general) (routine) without abnormal findings: Secondary | ICD-10-CM

## 2024-11-18 MED ORDER — NORETHIN ACE-ETH ESTRAD-FE 1-20 MG-MCG PO TABS: 1.0000 | ORAL_TABLET | Freq: Every day | ORAL | 0 refills | Status: AC

## 2024-11-18 NOTE — Patient Instructions (Signed)

## 2024-11-22 ENCOUNTER — Ambulatory Visit

## 2025-01-27 ENCOUNTER — Ambulatory Visit: Admitting: Certified Nurse Midwife
